# Patient Record
Sex: Female | Born: 1935 | Race: White | Hispanic: No | Smoking: Former smoker
Health system: Southern US, Community
[De-identification: ages and names within clinical notes are randomized; demographics above are authoritative.]

## PROBLEM LIST (undated history)

## (undated) DIAGNOSIS — R82998 Other abnormal findings in urine: Secondary | ICD-10-CM

## (undated) DIAGNOSIS — M5136 Other intervertebral disc degeneration, lumbar region: Secondary | ICD-10-CM

## (undated) DIAGNOSIS — R42 Dizziness and giddiness: Secondary | ICD-10-CM

## (undated) DIAGNOSIS — I10 Essential (primary) hypertension: Secondary | ICD-10-CM

## (undated) DIAGNOSIS — M419 Scoliosis, unspecified: Secondary | ICD-10-CM

## (undated) DIAGNOSIS — W19XXXA Unspecified fall, initial encounter: Secondary | ICD-10-CM

## (undated) DIAGNOSIS — I480 Paroxysmal atrial fibrillation: Secondary | ICD-10-CM

## (undated) DIAGNOSIS — M51369 Other intervertebral disc degeneration, lumbar region without mention of lumbar back pain or lower extremity pain: Secondary | ICD-10-CM

## (undated) DIAGNOSIS — K219 Gastro-esophageal reflux disease without esophagitis: Secondary | ICD-10-CM

## (undated) HISTORY — DX: Other abnormal findings in urine: R82.998

## (undated) HISTORY — DX: Paroxysmal atrial fibrillation: I48.0

## (undated) HISTORY — DX: Other intervertebral disc degeneration, lumbar region without mention of lumbar back pain or lower extremity pain: M51.369

## (undated) HISTORY — DX: Scoliosis, unspecified: M41.9

## (undated) HISTORY — DX: Gastro-esophageal reflux disease without esophagitis: K21.9

## (undated) HISTORY — PX: TUBAL LIGATION: SHX77

## (undated) HISTORY — DX: Other intervertebral disc degeneration, lumbar region: M51.36

---

## 2006-06-21 ENCOUNTER — Ambulatory Visit: Payer: Self-pay

## 2006-06-26 ENCOUNTER — Emergency Department: Payer: Self-pay | Admitting: Emergency Medicine

## 2006-06-26 ENCOUNTER — Other Ambulatory Visit: Payer: Self-pay

## 2010-12-12 ENCOUNTER — Emergency Department: Payer: Self-pay | Admitting: Emergency Medicine

## 2013-10-29 DIAGNOSIS — N182 Chronic kidney disease, stage 2 (mild): Secondary | ICD-10-CM | POA: Insufficient documentation

## 2017-09-03 ENCOUNTER — Encounter: Payer: Self-pay | Admitting: Emergency Medicine

## 2017-09-03 ENCOUNTER — Emergency Department: Payer: Medicare Other

## 2017-09-03 ENCOUNTER — Emergency Department
Admission: EM | Admit: 2017-09-03 | Discharge: 2017-09-03 | Disposition: A | Discharge status: Home / Self Care | Payer: Medicare Other | Attending: Emergency Medicine | Admitting: Emergency Medicine

## 2017-09-03 DIAGNOSIS — I10 Essential (primary) hypertension: Secondary | ICD-10-CM | POA: Insufficient documentation

## 2017-09-03 DIAGNOSIS — H81392 Other peripheral vertigo, left ear: Secondary | ICD-10-CM | POA: Insufficient documentation

## 2017-09-03 DIAGNOSIS — Z87891 Personal history of nicotine dependence: Secondary | ICD-10-CM | POA: Diagnosis not present

## 2017-09-03 DIAGNOSIS — Z79899 Other long term (current) drug therapy: Secondary | ICD-10-CM | POA: Insufficient documentation

## 2017-09-03 DIAGNOSIS — R42 Dizziness and giddiness: Secondary | ICD-10-CM | POA: Diagnosis present

## 2017-09-03 HISTORY — DX: Essential (primary) hypertension: I10

## 2017-09-03 LAB — COMPREHENSIVE METABOLIC PANEL
ALK PHOS: 53 U/L (ref 38–126)
ALT: 11 U/L — ABNORMAL LOW (ref 14–54)
ANION GAP: 11 (ref 5–15)
AST: 20 U/L (ref 15–41)
Albumin: 4 g/dL (ref 3.5–5.0)
BILIRUBIN TOTAL: 0.6 mg/dL (ref 0.3–1.2)
BUN: 14 mg/dL (ref 6–20)
CALCIUM: 9.7 mg/dL (ref 8.9–10.3)
CO2: 27 mmol/L (ref 22–32)
Chloride: 102 mmol/L (ref 101–111)
Creatinine, Ser: 1.18 mg/dL — ABNORMAL HIGH (ref 0.44–1.00)
GFR calc non Af Amer: 43 mL/min — ABNORMAL LOW (ref >60)
GFR, EST AFRICAN AMERICAN: 49 mL/min — AB (ref >60)
GLUCOSE: 117 mg/dL — AB (ref 65–99)
Potassium: 3.8 mmol/L (ref 3.5–5.1)
Sodium: 140 mmol/L (ref 135–145)
TOTAL PROTEIN: 7.5 g/dL (ref 6.5–8.1)

## 2017-09-03 LAB — URINALYSIS, COMPLETE (UACMP) WITH MICROSCOPIC
BILIRUBIN URINE: NEGATIVE
Bacteria, UA: NONE SEEN
GLUCOSE, UA: NEGATIVE mg/dL
Ketones, ur: NEGATIVE mg/dL
Leukocytes, UA: NEGATIVE
NITRITE: NEGATIVE
PH: 7 (ref 5.0–8.0)
Protein, ur: NEGATIVE mg/dL
SPECIFIC GRAVITY, URINE: 1.006 (ref 1.005–1.030)

## 2017-09-03 LAB — CBC
HCT: 41 % (ref 35.0–47.0)
HEMOGLOBIN: 13.5 g/dL (ref 12.0–16.0)
MCH: 29 pg (ref 26.0–34.0)
MCHC: 32.9 g/dL (ref 32.0–36.0)
MCV: 88.2 fL (ref 80.0–100.0)
Platelets: 281 10*3/uL (ref 150–440)
RBC: 4.65 MIL/uL (ref 3.80–5.20)
RDW: 13.6 % (ref 11.5–14.5)
WBC: 7.2 10*3/uL (ref 3.6–11.0)

## 2017-09-03 LAB — TROPONIN I: Troponin I: <0.03 ng/mL (ref <0.03)

## 2017-09-03 MED ORDER — SODIUM CHLORIDE 0.9 % IV BOLUS (SEPSIS)
500.0000 mL | Freq: Once | INTRAVENOUS | Status: AC
  Administered 2017-09-03: 500 mL via INTRAVENOUS

## 2017-09-03 MED ORDER — MECLIZINE HCL 25 MG PO TABS: 25.0000 mg | ORAL_TABLET | Freq: Three times a day (TID) | ORAL | 0 refills | Status: DC | PRN

## 2017-09-03 MED ORDER — MECLIZINE HCL 25 MG PO TABS
25.0000 mg | ORAL_TABLET | Freq: Once | ORAL | Status: AC
  Administered 2017-09-03: 25 mg via ORAL
  Filled 2017-09-03: qty 1

## 2017-09-03 NOTE — Discharge Instructions (Signed)
Please take your vertigo medications as needed for severe symptoms and make an appointment to follow-up with the ear nose and throat doctor this coming week for reevaluation.  Return to the emergency department sooner for any concerns whatsoever.  It was a pleasure to take care of you today, and thank you for coming to our emergency department.  If you have any questions or concerns before leaving please ask the nurse to grab me and I'm more than happy to go through your aftercare instructions again.  If you were prescribed any opioid pain medication today such as Norco, Vicodin, Percocet, morphine, hydrocodone, or oxycodone please make sure you do not drive when you are taking this medication as it can alter your ability to drive safely.  If you have any concerns once you are home that you are not improving or are in fact getting worse before you can make it to your follow-up appointment, please do not hesitate to call 911 and come back for further evaluation.  Merrily BrittleNeil Fares Ramthun, MD  Results for orders placed or performed during the hospital encounter of 09/03/17  CBC  Result Value Ref Range   WBC 7.2 3.6 - 11.0 K/uL   RBC 4.65 3.80 - 5.20 MIL/uL   Hemoglobin 13.5 12.0 - 16.0 g/dL   HCT 16.141.0 09.635.0 - 04.547.0 %   MCV 88.2 80.0 - 100.0 fL   MCH 29.0 26.0 - 34.0 pg   MCHC 32.9 32.0 - 36.0 g/dL   RDW 40.913.6 81.111.5 - 91.414.5 %   Platelets 281 150 - 440 K/uL  Urinalysis, Complete w Microscopic  Result Value Ref Range   Color, Urine STRAW (A) YELLOW   APPearance CLEAR (A) CLEAR   Specific Gravity, Urine 1.006 1.005 - 1.030   pH 7.0 5.0 - 8.0   Glucose, UA NEGATIVE NEGATIVE mg/dL   Hgb urine dipstick MODERATE (A) NEGATIVE   Bilirubin Urine NEGATIVE NEGATIVE   Ketones, ur NEGATIVE NEGATIVE mg/dL   Protein, ur NEGATIVE NEGATIVE mg/dL   Nitrite NEGATIVE NEGATIVE   Leukocytes, UA NEGATIVE NEGATIVE   RBC / HPF 0-5 0 - 5 RBC/hpf   WBC, UA 0-5 0 - 5 WBC/hpf   Bacteria, UA NONE SEEN NONE SEEN   Squamous  Epithelial / LPF 0-5 (A) NONE SEEN  Comprehensive metabolic panel  Result Value Ref Range   Sodium 140 135 - 145 mmol/L   Potassium 3.8 3.5 - 5.1 mmol/L   Chloride 102 101 - 111 mmol/L   CO2 27 22 - 32 mmol/L   Glucose, Bld 117 (H) 65 - 99 mg/dL   BUN 14 6 - 20 mg/dL   Creatinine, Ser 7.821.18 (H) 0.44 - 1.00 mg/dL   Calcium 9.7 8.9 - 95.610.3 mg/dL   Total Protein 7.5 6.5 - 8.1 g/dL   Albumin 4.0 3.5 - 5.0 g/dL   AST 20 15 - 41 U/L   ALT 11 (L) 14 - 54 U/L   Alkaline Phosphatase 53 38 - 126 U/L   Total Bilirubin 0.6 0.3 - 1.2 mg/dL   GFR calc non Af Amer 43 (L) >60 mL/min   GFR calc Af Amer 49 (L) >60 mL/min   Anion gap 11 5 - 15  Troponin I  Result Value Ref Range   Troponin I <0.03 <0.03 ng/mL   Mr Brain Wo Contrast (neuro Protocol)  Result Date: 09/03/2017 CLINICAL DATA:  Vertigo. EXAM: MRI HEAD WITHOUT CONTRAST TECHNIQUE: Multiplanar, multiecho pulse sequences of the brain and surrounding structures were obtained without intravenous  contrast. COMPARISON:  06/26/2006 head CT FINDINGS: Brain: There is no evidence of acute infarct, intracranial hemorrhage, mass, midline shift, or extra-axial fluid collection. There is progressive, advanced cerebral atrophy with notable perisylvian atrophy. Foci of T2 hyperintensity in the cerebral white matter are nonspecific but compatible with mild chronic small vessel ischemic disease. Vascular: Major intracranial vascular flow voids are preserved. Skull and upper cervical spine: No suspicious marrow lesion. Sinuses/Orbits: Unremarkable orbits. Clear paranasal sinuses. Small left mastoid effusion. Other: None. IMPRESSION: 1. No acute intracranial abnormality. 2. Advanced cerebral atrophy. 3. Mild chronic small vessel ischemic disease. 4. Small left mastoid effusion. Electronically Signed   By: Sebastian Ache M.D.   On: 09/03/2017 11:32

## 2017-09-03 NOTE — ED Provider Notes (Signed)
[facility] Emergency Department Provider Note  ____________________________________________   None    (approximate)  I have reviewed the triage vital signs and the nursing notes.   HISTORY  Chief Complaint Dizziness   HPI Kristen Richard is a 81 y.o. female who self presents to the emergency department with ataxia and vertiginous symptoms that began at 330 this morning roughly 5 hours prior to arrival.  She got up to go to the bathroom and felt unsteady on her feet and that she did not know where she was in the room.  She sat down and had persistent symptoms for several hours and then her family showed up and took her to the emergency department.  She says she is currently mostly asymptomatic.  Symptoms seem to be worse when standing up.  They do not seem worse when turning her head right or left.  She has had no ear buzzing.  She does report some numbness and tingling in bilateral hands.  Past Medical History:  Diagnosis Date  . Hypertension     There are no active problems to display for this patient.   History reviewed. No pertinent surgical history.  Prior to Admission medications   Medication Sig Start Date End Date Taking? Authorizing Provider  cetirizine (ZYRTEC) 10 MG tablet Take 10 mg by mouth daily.   Yes [provider]  lisinopril (PRINIVIL,ZESTRIL) 10 MG tablet Take 10 mg by mouth daily.   Yes [provider]  metoprolol tartrate (LOPRESSOR) 25 MG tablet Take 25 mg by mouth 2 (two) times daily.   Yes [provider]  omeprazole (PRILOSEC) 20 MG capsule Take 20 mg by mouth daily.   Yes [provider]  meclizine (ANTIVERT) 25 MG tablet Take 1 tablet (25 mg total) by mouth 3 (three) times daily as needed for dizziness. 09/03/17   Merrily Brittle, MD    Allergies Patient has no known allergies.  No family history on file.  Social History Social History  Substance Use Topics  . Smoking status: Former  Games developer  . Smokeless tobacco: Not on file  . Alcohol use Not on file    Review of Systems Constitutional: No fever/chills Eyes: No visual changes. ENT: No sore throat. Cardiovascular: Denies chest pain. Respiratory: Denies shortness of breath. Gastrointestinal: No abdominal pain.  No nausea, no vomiting.  No diarrhea.  No constipation. Genitourinary: Negative for dysuria. Musculoskeletal: Negative for back pain. Skin: Negative for rash. Neurological: Negative for headaches, focal weakness or numbness.   ____________________________________________   PHYSICAL EXAM:  VITAL SIGNS: ED Triage Vitals [09/03/17 0837]  Enc Vitals Group     BP (!) 148/75     Pulse Rate 74     Resp 20     Temp 98.4 F (36.9 C)     Temp Source Oral     SpO2 96 %     Weight 185 lb (83.9 kg)     Height 5\' 1"  (1.549 m)     Head Circumference      Peak Flow      Pain Score      Pain Loc      Pain Edu?      Excl. in GC?     Constitutional: Alert and oriented x4 pleasant cooperative speaks in full clear sentences no diaphoresis Eyes: PERRL EOMI. no nystagmus Head: Atraumatic. Nose: No congestion/rhinnorhea. Mouth/Throat: No trismus Neck: No stridor.   Cardiovascular: Normal rate, regular rhythm. Grossly normal heart sounds.  Good peripheral circulation.  Respiratory: Normal respiratory effort.  No retractions. Lungs CTAB and moving good air Gastrointestinal: Soft nontender Musculoskeletal: No lower extremity edema   Neurologic:  Alert and oriented 4 Cranial nerves II through XII intact No pronator drift 5 out of 5 grips, biceps, triceps, hip flexion, hip extension plantar flexion, dorsiflexion Sensation intact to light touch throughout 2+ DTRs and no ankle clonus Normal finger-nose-finger Unsteady on her feet with some truncal ataxia Skin:  Skin is warm, dry and intact. No rash noted. Psychiatric: Mood and affect are normal. Speech and behavior are  normal.    ____________________________________________   DIFFERENTIAL includes but not limited to  Peripheral vertigo, central vertigo, stroke ____________________________________________   LABS (all labs ordered are listed, but only abnormal results are displayed)  Labs Reviewed  URINALYSIS, COMPLETE (UACMP) WITH MICROSCOPIC - Abnormal; Notable for the following:       Result Value   Color, Urine STRAW (*)    APPearance CLEAR (*)    Hgb urine dipstick MODERATE (*)    Squamous Epithelial / LPF 0-5 (*)    All other components within normal limits  COMPREHENSIVE METABOLIC PANEL - Abnormal; Notable for the following:    Glucose, Bld 117 (*)    Creatinine, Ser 1.18 (*)    ALT 11 (*)    GFR calc non Af Amer 43 (*)    GFR calc Af Amer 49 (*)    All other components within normal limits  CBC  TROPONIN I    Blood work reviewed and interpreted by me with no evidence of infection no acute disease __________________________________________  EKG  ED ECG REPORT I, Merrily Brittle, the attending physician, personally viewed and interpreted this ECG.  Date: 09/03/2017 EKG Time:  Rate: 73 Rhythm: normal sinus rhythm QRS Axis: normal Intervals: normal ST/T Wave abnormalities: normal Narrative Interpretation: no evidence of acute ischemia  ____________________________________________  RADIOLOGY  MRI reviewed by me shows no acute disease ____________________________________________   PROCEDURES  Procedure(s) performed: no  Procedures  Critical Care performed: no  Observation: no ____________________________________________   INITIAL IMPRESSION / ASSESSMENT AND PLAN / ED COURSE  Pertinent labs & imaging results that were available during my care of the patient were reviewed by me and considered in my medical decision making (see chart for details).  Differential for a 81 year old woman who is lightheaded and vertiginous is extremely broad.  She has no particular  nystagmus on my exam and her symptoms seem to be lingering several hours.  When attempting to ambulate she is grossly unsteady.  I do have concern that this may represent central vertigo instead of a peripheral cause so we will take her to MRI instead of head CT.  She is outside any sort of window for TPA currently.    ----------------------------------------- 12:03 PM on 09/03/2017 -----------------------------------------  Fortunately the patient's MRI is negative for acute pathology.  She still does feel somewhat lightheaded so we will continue additional fluid resuscitation as well as give meclizine and check a urinalysis.  ____________________________________________   ----------------------------------------- 2:10 PM on 09/03/2017 -----------------------------------------  The patient still feels somewhat vertiginous although definitely feels improved and would like to go home.  Her urinalysis is negative.  Point we will treat her symptomatically refer her to otolaryngology outpatient.  Strict return precautions have been given and the patient and family verbalized understanding and agreement with plan.  FINAL CLINICAL IMPRESSION(S) / ED DIAGNOSES  Final diagnoses:  Peripheral vertigo involving left ear      NEW MEDICATIONS STARTED  DURING THIS VISIT:  Discharge Medication List as of 09/03/2017  2:10 PM    START taking these medications   Details  meclizine (ANTIVERT) 25 MG tablet Take 1 tablet (25 mg total) by mouth 3 (three) times daily as needed for dizziness., Starting Sat 09/03/2017, Print         Note:  This document was prepared using Dragon voice recognition software and may include unintentional dictation errors.     Merrily Brittleifenbark, Nachelle Negrette, MD 09/03/17 1534

## 2017-09-03 NOTE — ED Triage Notes (Signed)
States tried to get out bed this am and was too dizzy to get up. Had family assist her but still had dizziness with movement. States no dizziness while still. Denies chest pain.

## 2017-09-03 NOTE — ED Notes (Signed)
ED Provider at bedside. 

## 2017-09-03 NOTE — ED Notes (Signed)
Patient transported to MRI 

## 2018-12-25 ENCOUNTER — Encounter: Payer: Self-pay | Admitting: *Deleted

## 2018-12-28 ENCOUNTER — Ambulatory Visit
Admission: RE | Admit: 2018-12-28 | Discharge: 2018-12-28 | Disposition: A | Discharge status: Home / Self Care | Payer: Medicare Other | Attending: Ophthalmology | Admitting: Ophthalmology

## 2018-12-28 ENCOUNTER — Ambulatory Visit: Payer: Medicare Other | Admitting: Certified Registered"

## 2018-12-28 ENCOUNTER — Encounter
Admission: RE | Disposition: A | Discharge status: Home / Self Care | Payer: Self-pay | Source: Home / Self Care | Attending: Ophthalmology

## 2018-12-28 ENCOUNTER — Encounter: Payer: Self-pay | Admitting: *Deleted

## 2018-12-28 ENCOUNTER — Other Ambulatory Visit: Payer: Self-pay

## 2018-12-28 DIAGNOSIS — E669 Obesity, unspecified: Secondary | ICD-10-CM | POA: Diagnosis not present

## 2018-12-28 DIAGNOSIS — H2512 Age-related nuclear cataract, left eye: Secondary | ICD-10-CM | POA: Diagnosis present

## 2018-12-28 DIAGNOSIS — Z6830 Body mass index (BMI) 30.0-30.9, adult: Secondary | ICD-10-CM | POA: Diagnosis not present

## 2018-12-28 DIAGNOSIS — I10 Essential (primary) hypertension: Secondary | ICD-10-CM | POA: Insufficient documentation

## 2018-12-28 DIAGNOSIS — R42 Dizziness and giddiness: Secondary | ICD-10-CM | POA: Insufficient documentation

## 2018-12-28 DIAGNOSIS — Z87891 Personal history of nicotine dependence: Secondary | ICD-10-CM | POA: Insufficient documentation

## 2018-12-28 HISTORY — DX: Dizziness and giddiness: R42

## 2018-12-28 HISTORY — DX: Unspecified fall, initial encounter: W19.XXXA

## 2018-12-28 HISTORY — PX: CATARACT EXTRACTION W/PHACO: SHX586

## 2018-12-28 SURGERY — PHACOEMULSIFICATION, CATARACT, WITH IOL INSERTION
Anesthesia: Monitor Anesthesia Care | Site: Eye | Laterality: Left

## 2018-12-28 MED ORDER — ACETAZOLAMIDE SODIUM 500 MG IJ SOLR
INTRAMUSCULAR | Status: AC
  Filled 2018-12-28: qty 500

## 2018-12-28 MED ORDER — ONDANSETRON HCL 4 MG/2ML IJ SOLN
INTRAMUSCULAR | Status: DC | PRN
  Administered 2018-12-28: 4 mg via INTRAVENOUS

## 2018-12-28 MED ORDER — CARBACHOL 0.01 % IO SOLN
INTRAOCULAR | Status: DC | PRN
  Administered 2018-12-28: .5 mL via INTRAOCULAR

## 2018-12-28 MED ORDER — MOXIFLOXACIN HCL 0.5 % OP SOLN: 1.0000 [drp] | OPHTHALMIC | Status: DC | PRN

## 2018-12-28 MED ORDER — LIDOCAINE HCL (PF) 4 % IJ SOLN
INTRAMUSCULAR | Status: AC
  Filled 2018-12-28: qty 5

## 2018-12-28 MED ORDER — POVIDONE-IODINE 5 % OP SOLN
OPHTHALMIC | Status: AC
  Filled 2018-12-28: qty 60

## 2018-12-28 MED ORDER — TETRACAINE HCL 0.5 % OP SOLN
1.0000 [drp] | OPHTHALMIC | Status: AC | PRN
  Administered 2018-12-28 (×3): 1 [drp] via OPHTHALMIC

## 2018-12-28 MED ORDER — POVIDONE-IODINE 5 % OP SOLN
OPHTHALMIC | Status: DC | PRN
  Administered 2018-12-28: 1 via OPHTHALMIC

## 2018-12-28 MED ORDER — NA HYALUR & NA CHOND-NA HYALUR 0.55-0.5 ML IO KIT
PACK | INTRAOCULAR | Status: AC
  Filled 2018-12-28: qty 1.05

## 2018-12-28 MED ORDER — EPINEPHRINE PF 1 MG/ML IJ SOLN
INTRAOCULAR | Status: DC | PRN
  Administered 2018-12-28: 1 mL via OPHTHALMIC

## 2018-12-28 MED ORDER — MOXIFLOXACIN HCL 0.5 % OP SOLN
OPHTHALMIC | Status: AC
  Filled 2018-12-28: qty 3

## 2018-12-28 MED ORDER — MIDAZOLAM HCL 2 MG/2ML IJ SOLN
INTRAMUSCULAR | Status: DC | PRN
  Administered 2018-12-28: 0.5 mg via INTRAVENOUS

## 2018-12-28 MED ORDER — ARMC OPHTHALMIC DILATING DROPS
OPHTHALMIC | Status: AC
  Administered 2018-12-28: 1 via OPHTHALMIC
  Filled 2018-12-28: qty 0.5

## 2018-12-28 MED ORDER — ACETAZOLAMIDE SODIUM 500 MG IJ SOLR
INTRAMUSCULAR | Status: DC | PRN
  Administered 2018-12-28: 250 mg via INTRAVENOUS

## 2018-12-28 MED ORDER — DORZOLAMIDE HCL-TIMOLOL MAL 2-0.5 % OP SOLN
OPHTHALMIC | Status: DC | PRN
  Administered 2018-12-28: 2 [drp] via OPHTHALMIC

## 2018-12-28 MED ORDER — ARMC OPHTHALMIC DILATING DROPS
1.0000 "application " | OPHTHALMIC | Status: AC
  Administered 2018-12-28 (×3): 1 via OPHTHALMIC

## 2018-12-28 MED ORDER — TRYPAN BLUE 0.06 % OP SOLN
OPHTHALMIC | Status: DC | PRN
  Administered 2018-12-28: .5 mL via INTRAOCULAR

## 2018-12-28 MED ORDER — SODIUM CHLORIDE 0.9 % IV SOLN
INTRAVENOUS | Status: DC
  Administered 2018-12-28: 10:00:00 via INTRAVENOUS

## 2018-12-28 MED ORDER — NA HYALUR & NA CHOND-NA HYALUR 0.55-0.5 ML IO KIT
PACK | INTRAOCULAR | Status: DC | PRN
  Administered 2018-12-28: 1 via OPHTHALMIC

## 2018-12-28 MED ORDER — NA CHONDROIT SULF-NA HYALURON 40-17 MG/ML IO SOLN
INTRAOCULAR | Status: AC
  Filled 2018-12-28: qty 1

## 2018-12-28 MED ORDER — FENTANYL CITRATE (PF) 100 MCG/2ML IJ SOLN
INTRAMUSCULAR | Status: DC | PRN
  Administered 2018-12-28 (×2): 25 ug via INTRAVENOUS

## 2018-12-28 MED ORDER — EPINEPHRINE PF 1 MG/ML IJ SOLN
INTRAMUSCULAR | Status: AC
  Filled 2018-12-28: qty 1

## 2018-12-28 MED ORDER — FENTANYL CITRATE (PF) 100 MCG/2ML IJ SOLN
INTRAMUSCULAR | Status: AC
  Filled 2018-12-28: qty 2

## 2018-12-28 MED ORDER — LIDOCAINE HCL (PF) 4 % IJ SOLN
INTRAOCULAR | Status: DC | PRN
  Administered 2018-12-28: 2 mL via OPHTHALMIC

## 2018-12-28 MED ORDER — ONDANSETRON HCL 4 MG/2ML IJ SOLN
INTRAMUSCULAR | Status: AC
  Filled 2018-12-28: qty 2

## 2018-12-28 MED ORDER — MIDAZOLAM HCL 2 MG/2ML IJ SOLN
INTRAMUSCULAR | Status: AC
  Filled 2018-12-28: qty 2

## 2018-12-28 MED ORDER — TETRACAINE HCL 0.5 % OP SOLN
OPHTHALMIC | Status: AC
  Administered 2018-12-28: 1 [drp] via OPHTHALMIC
  Filled 2018-12-28: qty 4

## 2018-12-28 MED ORDER — TRYPAN BLUE 0.06 % OP SOLN
OPHTHALMIC | Status: AC
  Filled 2018-12-28: qty 0.5

## 2018-12-28 MED ORDER — MOXIFLOXACIN HCL 0.5 % OP SOLN
OPHTHALMIC | Status: DC | PRN
  Administered 2018-12-28: .2 mL via OPHTHALMIC

## 2018-12-28 MED ORDER — NA CHONDROIT SULF-NA HYALURON 40-17 MG/ML IO SOLN
INTRAOCULAR | Status: DC | PRN
  Administered 2018-12-28: 1 mL via INTRAOCULAR

## 2018-12-28 SURGICAL SUPPLY — 18 items
DISSECTOR HYDRO NUCLEUS 50X22 (MISCELLANEOUS) ×12 IMPLANT
GLOVE BIOGEL M 6.5 STRL (GLOVE) ×3 IMPLANT
GOWN STRL REUS W/ TWL LRG LVL3 (GOWN DISPOSABLE) ×1 IMPLANT
GOWN STRL REUS W/ TWL XL LVL3 (GOWN DISPOSABLE) ×1 IMPLANT
GOWN STRL REUS W/TWL LRG LVL3 (GOWN DISPOSABLE) ×2
GOWN STRL REUS W/TWL XL LVL3 (GOWN DISPOSABLE) ×2
KNIFE 45D UP 2.3 (MISCELLANEOUS) ×3 IMPLANT
LABEL CATARACT MEDS ST (LABEL) ×3 IMPLANT
LENS IOL ACRSF IQ ULTRA 24.5 (Intraocular Lens) ×1 IMPLANT
LENS IOL ACRYSOF IQ 24.5 (Intraocular Lens) ×3 IMPLANT
PACK CATARACT (MISCELLANEOUS) ×3 IMPLANT
PACK CATARACT KING (MISCELLANEOUS) ×3 IMPLANT
PACK EYE AFTER SURG (MISCELLANEOUS) ×3 IMPLANT
SOL BSS BAG (MISCELLANEOUS) ×3
SOLUTION BSS BAG (MISCELLANEOUS) ×1 IMPLANT
SYR 5ML LL (SYRINGE) ×3 IMPLANT
WATER STERILE IRR 250ML POUR (IV SOLUTION) ×3 IMPLANT
WIPE NON LINTING 3.25X3.25 (MISCELLANEOUS) ×3 IMPLANT

## 2018-12-28 NOTE — H&P (Signed)
   I have reviewed the patient's H&P and agree with its findings. There have been no interval changes.  Sherita Decoste MD Ophthalmology 

## 2018-12-28 NOTE — Anesthesia Preprocedure Evaluation (Signed)
Anesthesia Evaluation  Patient identified by MRN, date of birth, ID band Patient awake    Reviewed: Allergy & Precautions, NPO status , Patient's Chart, lab work & pertinent test results  History of Anesthesia Complications Negative for: history of anesthetic complications  Airway Mallampati: III  TM Distance: >3 FB Neck ROM: Full    Dental  (+) Upper Dentures, Lower Dentures   Pulmonary neg sleep apnea, neg COPD, former smoker,    breath sounds clear to auscultation- rhonchi (-) wheezing      Cardiovascular hypertension, Pt. on medications (-) CAD, (-) Past MI, (-) Cardiac Stents and (-) CABG  Rhythm:Regular Rate:Normal - Systolic murmurs and - Diastolic murmurs    Neuro/Psych neg Seizures negative neurological ROS  negative psych ROS   GI/Hepatic negative GI ROS, Neg liver ROS,   Endo/Other  negative endocrine ROSneg diabetes  Renal/GU negative Renal ROS     Musculoskeletal negative musculoskeletal ROS (+)   Abdominal (+) + obese,   Peds  Hematology negative hematology ROS (+)   Anesthesia Other Findings Past Medical History: No date: Fall     Comment:  RISK No date: Hypertension No date: Vertigo   Reproductive/Obstetrics                             Anesthesia Physical Anesthesia Plan  ASA: II  Anesthesia Plan: MAC   Post-op Pain Management:    Induction: Intravenous  PONV Risk Score and Plan: 2 and Midazolam  Airway Management Planned: Natural Airway  Additional Equipment:   Intra-op Plan:   Post-operative Plan:   Informed Consent: I have reviewed the patients History and Physical, chart, labs and discussed the procedure including the risks, benefits and alternatives for the proposed anesthesia with the patient or authorized representative who has indicated his/her understanding and acceptance.       Plan Discussed with: CRNA and Anesthesiologist  Anesthesia  Plan Comments:         Anesthesia Quick Evaluation

## 2018-12-28 NOTE — Anesthesia Postprocedure Evaluation (Signed)
Anesthesia Post Note  Patient: Kristen Richard  Procedure(s) Performed: CATARACT EXTRACTION PHACO AND INTRAOCULAR LENS PLACEMENT ([place]) LEFT (Left Eye)  Patient location during evaluation: Phase II Anesthesia Type: MAC Level of consciousness: awake Pain management: pain level controlled Vital Signs Assessment: post-procedure vital signs reviewed and stable Respiratory status: spontaneous breathing Cardiovascular status: blood pressure returned to baseline Postop Assessment: no apparent nausea or vomiting and adequate PO intake Anesthetic complications: no     Last Vitals:  Vitals:   12/28/18 0932 12/28/18 1147  BP: (!) 166/70 (!) 142/63  Pulse: 74 69  Resp:  18  Temp: 36.8 C (!) 36.1 C  SpO2: 100% 100%    Last Pain:  Vitals:   12/28/18 1147  TempSrc: Temporal  PainSc: 0-No pain                 Lavone Orn

## 2018-12-28 NOTE — OR Nursing (Signed)
Dr. Lara Mulch in to see pt postop 1215 and instructed her and family to go to his office after leaving here for eye pressure check, state they will do as requested.

## 2018-12-28 NOTE — Anesthesia Post-op Follow-up Note (Signed)
Anesthesia QCDR form completed.        

## 2018-12-28 NOTE — Transfer of Care (Signed)
Immediate Anesthesia Transfer of Care Note  Patient: Kristen Richard  Procedure(s) Performed: CATARACT EXTRACTION PHACO AND INTRAOCULAR LENS PLACEMENT ([facility]) LEFT (Left Eye)  Patient Location: PACU  Anesthesia Type:MAC  Level of Consciousness: awake  Airway & Oxygen Therapy: Patient Spontanous Breathing  Post-op Assessment: Report given to RN  Post vital signs: stable  Last Vitals:  Vitals Value Taken Time  BP    Temp    Pulse    Resp    SpO2      Last Pain:  Vitals:   12/28/18 0932  TempSrc: Tympanic  PainSc: 0-No pain         Complications: No apparent anesthesia complications

## 2018-12-28 NOTE — Discharge Instructions (Addendum)
Eye Surgery Discharge Instructions  Expect mild scratchy sensation or mild soreness. DO NOT RUB YOUR EYE!  The day of surgery:  Minimal physical activity, but bed rest is not required  No reading, computer work, or close hand work  No bending, lifting, or straining.  May watch TV  For 24 hours:  No driving, legal decisions, or alcoholic beverages  Safety precautions  Eat anything you prefer: It is better to start with liquids, then soup then solid foods.  Solar shield eyeglasses should be worn for comfort in the sunlight/patch while sleeping  Resume all regular medications including aspirin or Coumadin if these were discontinued prior to surgery. You may shower, bathe, shave, or wash your hair. Tylenol may be taken for mild discomfort. FOLLOW DR. HARROW'S EYE DROP INSTRUCTION SHEET AS REVIEWED.  Call your doctor if you experience significant pain, nausea, or vomiting, fever > 101 or other signs of infection. [phone] or [phone] Specific instructions:  Follow-up Information    Kristen Cousin, MD Follow up.   Specialty:  Ophthalmology Why:  12/29/18 @ 8:15 AM Contact information: [address] [place] [address] [phone]

## 2018-12-28 NOTE — Op Note (Signed)
  PREOPERATIVE DIAGNOSIS:  Nuclear sclerotic cataract of the LEFT eye.   POSTOPERATIVE DIAGNOSIS:  Nuclear sclerotic cataract of the LEFT eye.   OPERATIVE PROCEDURE: Cataract surgery OS   SURGEON:  Elliot Cousin, MD.   ANESTHESIA:  Anesthesiologist: Alver Fisher, MD CRNA: Oletta Lamas, CRNA  1.      Managed anesthesia care. 2.     0.84ml of Shugarcaine was instilled following the paracentesis   COMPLICATIONS:  None.   TECHNIQUE:   Divide and conquer   DESCRIPTION OF PROCEDURE:  The patient was examined and consented in the preoperative holding area where the aforementioned topical anesthesia was applied to the LEFT eye and then brought back to the Operating Room where the left eye was prepped and draped in the usual sterile ophthalmic fashion and a lid speculum was placed. A paracentesis was created with the side port blade, the anterior chamber was washed out with trypan blue to stain the anterior capsule, and the anterior chamber was filled with viscoelastic. A near clear corneal incision was performed with the steel keratome. A continuous curvilinear capsulorrhexis was performed with a cystotome followed by the capsulorrhexis forceps. Hydrodissection and hydrodelineation were carried out with BSS on a blunt cannula. The lens was removed in a divide and conquer  technique and the remaining cortical material was removed with the irrigation-aspiration handpiece. The capsular bag was inflated with viscoelastic and the lens was placed in the capsular bag without complication. The remaining viscoelastic was removed from the eye with the irrigation-aspiration handpiece. The wounds were hydrated. The anterior chamber was flushed and the eye was inflated to physiologic pressure. 0.18ml Vigamox was placed in the anterior chamber. 0.1 ml of Miostat was given, as well as 250 mg Diamox iv (the patient's baseline IOP is around 38). The wounds were found to be water tight. The eye was dressed with  Vigamox. The patient was given protective glasses to wear throughout the day and a shield with which to sleep tonight. The patient was also given drops with which to begin a drop regimen today and will follow-up with me in one day. Implant Name Type Inv. Item Serial No. Manufacturer Lot No. LRB No. Used  LENS IOL ACRYSOF IQ 24.5 - [id number] Intraocular Lens LENS IOL ACRYSOF IQ 24.5 [id number] [employer]  Left 1    Procedure(s) with comments: CATARACT EXTRACTION PHACO AND INTRAOCULAR LENS PLACEMENT ([facility]) LEFT (Left) - [place]   01:35 CDE 15.57 Fluid pack lot # [id number]  Electronically signed: Elliot Cousin 12/28/2018 11:46 AM

## 2018-12-29 ENCOUNTER — Encounter: Payer: Self-pay | Admitting: Ophthalmology

## 2019-10-26 ENCOUNTER — Emergency Department: Payer: Medicare Other

## 2019-10-26 ENCOUNTER — Observation Stay: Payer: Medicare Other

## 2019-10-26 ENCOUNTER — Inpatient Hospital Stay
Admission: EM | Admit: 2019-10-26 | Discharge: 2019-10-30 | DRG: 558 | Disposition: A | Discharge status: Skilled Nursing Facility | Payer: Medicare Other | Attending: Internal Medicine | Admitting: Internal Medicine

## 2019-10-26 ENCOUNTER — Other Ambulatory Visit: Payer: Self-pay

## 2019-10-26 DIAGNOSIS — T796XXA Traumatic ischemia of muscle, initial encounter: Secondary | ICD-10-CM | POA: Diagnosis not present

## 2019-10-26 DIAGNOSIS — Z20828 Contact with and (suspected) exposure to other viral communicable diseases: Secondary | ICD-10-CM | POA: Diagnosis present

## 2019-10-26 DIAGNOSIS — Z961 Presence of intraocular lens: Secondary | ICD-10-CM | POA: Diagnosis present

## 2019-10-26 DIAGNOSIS — Z87891 Personal history of nicotine dependence: Secondary | ICD-10-CM

## 2019-10-26 DIAGNOSIS — I4891 Unspecified atrial fibrillation: Secondary | ICD-10-CM | POA: Diagnosis present

## 2019-10-26 DIAGNOSIS — R778 Other specified abnormalities of plasma proteins: Secondary | ICD-10-CM

## 2019-10-26 DIAGNOSIS — L8990 Pressure ulcer of unspecified site, unspecified stage: Secondary | ICD-10-CM | POA: Insufficient documentation

## 2019-10-26 DIAGNOSIS — H409 Unspecified glaucoma: Secondary | ICD-10-CM | POA: Diagnosis present

## 2019-10-26 DIAGNOSIS — Y92009 Unspecified place in unspecified non-institutional (private) residence as the place of occurrence of the external cause: Secondary | ICD-10-CM

## 2019-10-26 DIAGNOSIS — W19XXXA Unspecified fall, initial encounter: Secondary | ICD-10-CM

## 2019-10-26 DIAGNOSIS — M6282 Rhabdomyolysis: Secondary | ICD-10-CM | POA: Diagnosis not present

## 2019-10-26 DIAGNOSIS — S0990XA Unspecified injury of head, initial encounter: Secondary | ICD-10-CM

## 2019-10-26 DIAGNOSIS — Z888 Allergy status to other drugs, medicaments and biological substances status: Secondary | ICD-10-CM

## 2019-10-26 DIAGNOSIS — E785 Hyperlipidemia, unspecified: Secondary | ICD-10-CM | POA: Diagnosis present

## 2019-10-26 DIAGNOSIS — S0003XA Contusion of scalp, initial encounter: Secondary | ICD-10-CM | POA: Diagnosis present

## 2019-10-26 DIAGNOSIS — D72828 Other elevated white blood cell count: Secondary | ICD-10-CM | POA: Diagnosis present

## 2019-10-26 DIAGNOSIS — L89151 Pressure ulcer of sacral region, stage 1: Secondary | ICD-10-CM | POA: Diagnosis present

## 2019-10-26 DIAGNOSIS — E876 Hypokalemia: Secondary | ICD-10-CM | POA: Diagnosis present

## 2019-10-26 DIAGNOSIS — R42 Dizziness and giddiness: Secondary | ICD-10-CM

## 2019-10-26 DIAGNOSIS — K219 Gastro-esophageal reflux disease without esophagitis: Secondary | ICD-10-CM | POA: Diagnosis present

## 2019-10-26 DIAGNOSIS — Z79899 Other long term (current) drug therapy: Secondary | ICD-10-CM

## 2019-10-26 DIAGNOSIS — W1839XA Other fall on same level, initial encounter: Secondary | ICD-10-CM | POA: Diagnosis present

## 2019-10-26 DIAGNOSIS — Z9842 Cataract extraction status, left eye: Secondary | ICD-10-CM

## 2019-10-26 DIAGNOSIS — R7989 Other specified abnormal findings of blood chemistry: Secondary | ICD-10-CM

## 2019-10-26 DIAGNOSIS — M419 Scoliosis, unspecified: Secondary | ICD-10-CM | POA: Diagnosis present

## 2019-10-26 DIAGNOSIS — E559 Vitamin D deficiency, unspecified: Secondary | ICD-10-CM | POA: Diagnosis present

## 2019-10-26 DIAGNOSIS — Z9851 Tubal ligation status: Secondary | ICD-10-CM

## 2019-10-26 DIAGNOSIS — Z882 Allergy status to sulfonamides status: Secondary | ICD-10-CM

## 2019-10-26 DIAGNOSIS — M479 Spondylosis, unspecified: Secondary | ICD-10-CM | POA: Diagnosis present

## 2019-10-26 DIAGNOSIS — I160 Hypertensive urgency: Secondary | ICD-10-CM | POA: Diagnosis present

## 2019-10-26 DIAGNOSIS — M25551 Pain in right hip: Secondary | ICD-10-CM | POA: Diagnosis present

## 2019-10-26 DIAGNOSIS — R748 Abnormal levels of other serum enzymes: Secondary | ICD-10-CM

## 2019-10-26 DIAGNOSIS — Z9181 History of falling: Secondary | ICD-10-CM

## 2019-10-26 DIAGNOSIS — M5136 Other intervertebral disc degeneration, lumbar region: Secondary | ICD-10-CM | POA: Diagnosis present

## 2019-10-26 DIAGNOSIS — I10 Essential (primary) hypertension: Secondary | ICD-10-CM | POA: Diagnosis present

## 2019-10-26 DIAGNOSIS — E878 Other disorders of electrolyte and fluid balance, not elsewhere classified: Secondary | ICD-10-CM | POA: Diagnosis present

## 2019-10-26 DIAGNOSIS — M5137 Other intervertebral disc degeneration, lumbosacral region: Secondary | ICD-10-CM | POA: Diagnosis present

## 2019-10-26 DIAGNOSIS — M545 Low back pain: Secondary | ICD-10-CM | POA: Diagnosis not present

## 2019-10-26 DIAGNOSIS — D649 Anemia, unspecified: Secondary | ICD-10-CM | POA: Diagnosis present

## 2019-10-26 LAB — URINALYSIS, COMPLETE (UACMP) WITH MICROSCOPIC
Bacteria, UA: NONE SEEN
Bilirubin Urine: NEGATIVE
Glucose, UA: NEGATIVE mg/dL
Ketones, ur: 20 mg/dL — AB
Leukocytes,Ua: NEGATIVE
Nitrite: NEGATIVE
Protein, ur: 30 mg/dL — AB
Specific Gravity, Urine: 1.017 (ref 1.005–1.030)
pH: 5 (ref 5.0–8.0)

## 2019-10-26 LAB — CBC WITH DIFFERENTIAL/PLATELET
Abs Immature Granulocytes: 0.05 10*3/uL (ref 0.00–0.07)
Basophils Absolute: 0 10*3/uL (ref 0.0–0.1)
Basophils Relative: 0 %
Eosinophils Absolute: 0 10*3/uL (ref 0.0–0.5)
Eosinophils Relative: 0 %
HCT: 40.7 % (ref 36.0–46.0)
Hemoglobin: 12.8 g/dL (ref 12.0–15.0)
Immature Granulocytes: 0 %
Lymphocytes Relative: 9 %
Lymphs Abs: 1.1 10*3/uL (ref 0.7–4.0)
MCH: 28.9 pg (ref 26.0–34.0)
MCHC: 31.4 g/dL (ref 30.0–36.0)
MCV: 91.9 fL (ref 80.0–100.0)
Monocytes Absolute: 0.7 10*3/uL (ref 0.1–1.0)
Monocytes Relative: 5 %
Neutro Abs: 10.9 10*3/uL — ABNORMAL HIGH (ref 1.7–7.7)
Neutrophils Relative %: 86 %
Platelets: 362 10*3/uL (ref 150–400)
RBC: 4.43 MIL/uL (ref 3.87–5.11)
RDW: 13.3 % (ref 11.5–15.5)
WBC: 12.8 10*3/uL — ABNORMAL HIGH (ref 4.0–10.5)
nRBC: 0 % (ref 0.0–0.2)

## 2019-10-26 LAB — COMPREHENSIVE METABOLIC PANEL
ALT: 21 U/L (ref 0–44)
AST: 50 U/L — ABNORMAL HIGH (ref 15–41)
Albumin: 3.9 g/dL (ref 3.5–5.0)
Alkaline Phosphatase: 62 U/L (ref 38–126)
Anion gap: 15 (ref 5–15)
BUN: 12 mg/dL (ref 8–23)
CO2: 25 mmol/L (ref 22–32)
Calcium: 9.4 mg/dL (ref 8.9–10.3)
Chloride: 96 mmol/L — ABNORMAL LOW (ref 98–111)
Creatinine, Ser: 0.72 mg/dL (ref 0.44–1.00)
GFR calc Af Amer: >60 mL/min (ref >60)
GFR calc non Af Amer: >60 mL/min (ref >60)
Glucose, Bld: 95 mg/dL (ref 70–99)
Potassium: 3.4 mmol/L — ABNORMAL LOW (ref 3.5–5.1)
Sodium: 136 mmol/L (ref 135–145)
Total Bilirubin: 1.9 mg/dL — ABNORMAL HIGH (ref 0.3–1.2)
Total Protein: 7 g/dL (ref 6.5–8.1)

## 2019-10-26 LAB — TROPONIN I (HIGH SENSITIVITY)
Troponin I (High Sensitivity): 145 ng/L (ref <18)
Troponin I (High Sensitivity): 151 ng/L (ref <18)

## 2019-10-26 LAB — CK: Total CK: 920 U/L — ABNORMAL HIGH (ref 38–234)

## 2019-10-26 LAB — MAGNESIUM: Magnesium: 1.7 mg/dL (ref 1.7–2.4)

## 2019-10-26 MED ORDER — SODIUM CHLORIDE 0.9 % IV SOLN: INTRAVENOUS | Status: DC

## 2019-10-26 MED ORDER — METOPROLOL TARTRATE 25 MG PO TABS
25.0000 mg | ORAL_TABLET | Freq: Two times a day (BID) | ORAL | Status: DC
  Administered 2019-10-26 – 2019-10-30 (×9): 25 mg via ORAL
  Filled 2019-10-26 (×9): qty 1

## 2019-10-26 MED ORDER — ACETAMINOPHEN 325 MG PO TABS: 650.0000 mg | ORAL_TABLET | Freq: Four times a day (QID) | ORAL | Status: DC | PRN

## 2019-10-26 MED ORDER — LISINOPRIL 10 MG PO TABS
20.0000 mg | ORAL_TABLET | Freq: Every day | ORAL | Status: DC
  Administered 2019-10-27 – 2019-10-30 (×4): 20 mg via ORAL
  Filled 2019-10-26 (×4): qty 2

## 2019-10-26 MED ORDER — ENOXAPARIN SODIUM 40 MG/0.4ML ~~LOC~~ SOLN
40.0000 mg | SUBCUTANEOUS | Status: DC
  Administered 2019-10-26 – 2019-10-29 (×4): 40 mg via SUBCUTANEOUS
  Filled 2019-10-26 (×4): qty 0.4

## 2019-10-26 MED ORDER — ACETAMINOPHEN 650 MG RE SUPP: 650.0000 mg | Freq: Four times a day (QID) | RECTAL | Status: DC | PRN

## 2019-10-26 MED ORDER — MAGNESIUM HYDROXIDE 400 MG/5ML PO SUSP
30.0000 mL | Freq: Every day | ORAL | Status: DC | PRN
  Filled 2019-10-26: qty 30

## 2019-10-26 MED ORDER — TRAZODONE HCL 50 MG PO TABS: 25.0000 mg | ORAL_TABLET | Freq: Every evening | ORAL | Status: DC | PRN

## 2019-10-26 MED ORDER — SIMVASTATIN 20 MG PO TABS
40.0000 mg | ORAL_TABLET | Freq: Every day | ORAL | Status: DC
  Administered 2019-10-27 – 2019-10-30 (×4): 40 mg via ORAL
  Filled 2019-10-26 (×5): qty 2

## 2019-10-26 MED ORDER — ONDANSETRON HCL 4 MG/2ML IJ SOLN: 4.0000 mg | Freq: Four times a day (QID) | INTRAMUSCULAR | Status: DC | PRN

## 2019-10-26 MED ORDER — MORPHINE SULFATE (PF) 2 MG/ML IV SOLN
2.0000 mg | Freq: Once | INTRAVENOUS | Status: AC
  Administered 2019-10-26: 2 mg via INTRAVENOUS
  Filled 2019-10-26: qty 1

## 2019-10-26 MED ORDER — MECLIZINE HCL 25 MG PO TABS
12.5000 mg | ORAL_TABLET | Freq: Three times a day (TID) | ORAL | Status: DC | PRN
  Filled 2019-10-26: qty 0.5

## 2019-10-26 MED ORDER — VITAMIN D (ERGOCALCIFEROL) 1.25 MG (50000 UNIT) PO CAPS
50000.0000 [IU] | ORAL_CAPSULE | ORAL | Status: DC
  Administered 2019-10-29: 50000 [IU] via ORAL
  Filled 2019-10-26: qty 1

## 2019-10-26 MED ORDER — PANTOPRAZOLE SODIUM 40 MG PO TBEC
40.0000 mg | DELAYED_RELEASE_TABLET | Freq: Every day | ORAL | Status: DC
  Administered 2019-10-27 – 2019-10-30 (×4): 40 mg via ORAL
  Filled 2019-10-26 (×4): qty 1

## 2019-10-26 MED ORDER — ASPIRIN EC 81 MG PO TBEC
81.0000 mg | DELAYED_RELEASE_TABLET | Freq: Every day | ORAL | Status: DC
  Administered 2019-10-26 – 2019-10-30 (×5): 81 mg via ORAL
  Filled 2019-10-26 (×5): qty 1

## 2019-10-26 MED ORDER — LATANOPROST 0.005 % OP SOLN
1.0000 [drp] | Freq: Every day | OPHTHALMIC | Status: DC
  Administered 2019-10-27 – 2019-10-29 (×3): 1 [drp] via OPHTHALMIC
  Filled 2019-10-26: qty 2.5

## 2019-10-26 MED ORDER — DIPHENHYDRAMINE HCL 25 MG PO TABS: 25.0000 mg | ORAL_TABLET | Freq: Every day | ORAL | Status: DC | PRN

## 2019-10-26 MED ORDER — ONDANSETRON HCL 4 MG PO TABS: 4.0000 mg | ORAL_TABLET | Freq: Four times a day (QID) | ORAL | Status: DC | PRN

## 2019-10-26 MED ORDER — POTASSIUM CHLORIDE 20 MEQ PO PACK
40.0000 meq | PACK | Freq: Once | ORAL | Status: AC
  Administered 2019-10-26: 40 meq via ORAL
  Filled 2019-10-26: qty 2

## 2019-10-26 MED ORDER — SODIUM CHLORIDE 0.9 % IV SOLN: Freq: Once | INTRAVENOUS | Status: AC

## 2019-10-26 MED ORDER — CALCIUM CARBONATE-VITAMIN D 500-200 MG-UNIT PO TABS
1.0000 | ORAL_TABLET | Freq: Every day | ORAL | Status: DC
  Administered 2019-10-27 – 2019-10-30 (×4): 1 via ORAL
  Filled 2019-10-26 (×4): qty 1

## 2019-10-26 MED ORDER — DORZOLAMIDE HCL-TIMOLOL MAL 2-0.5 % OP SOLN
1.0000 [drp] | Freq: Two times a day (BID) | OPHTHALMIC | Status: DC
  Administered 2019-10-27 – 2019-10-30 (×7): 1 [drp] via OPHTHALMIC
  Filled 2019-10-26: qty 10

## 2019-10-26 NOTE — ED Provider Notes (Signed)
[facility] Emergency Department Provider Note       Time seen: ----------------------------------------- 5:58 PM on 10/26/2019 -----------------------------------------   I have reviewed the triage vital signs and the nursing notes.  HISTORY   Chief Complaint Fall    HPI Kristen Richard is a 83 y.o. female with a history of hypertension, vertigo who presents to the ED for a fall.  Patient states it was a couple days ago she fell.  She got dizzy and fell backwards, states she lost consciousness.  She has not had any vomiting, states when she stands she gets dizzy.  EMS reports she may have had a urinary tract infection.  Patient states when she fell she could not get up.  Pain is 8 out of 10 in the low back.  Past Medical History:  Diagnosis Date  . Fall    RISK  . Hypertension   . Vertigo     There are no problems to display for this patient.   Past Surgical History:  Procedure Laterality Date  . CATARACT EXTRACTION W/PHACO Left 12/28/2018   Procedure: CATARACT EXTRACTION PHACO AND INTRAOCULAR LENS PLACEMENT ([place]) LEFT;  Surgeon: Marchia Meiers, MD;  Location: [facility] ORS;  Service: Ophthalmology;  Laterality: Left;  [place]   01:35 CDE 15.57 Fluid pack lot # [id number]  . TUBAL LIGATION      Allergies Sulfa antibiotics and Acetazolamide  Social History Social History   Tobacco Use  . Smoking status: Former Research scientist (life sciences)  . Smokeless tobacco: Former Network engineer Use Topics  . Alcohol use: Not on file  . Drug use: Not on file   Review of Systems Constitutional: Negative for fever. Cardiovascular: Negative for chest pain. Respiratory: Negative for shortness of breath. Gastrointestinal: Negative for abdominal pain, vomiting and diarrhea. Musculoskeletal: Positive for low back pain Skin: Negative for rash. Neurological: Negative for headaches, positive for weakness and dizziness  All systems negative/normal/unremarkable except as stated in the  HPI  ____________________________________________   PHYSICAL EXAM:  VITAL SIGNS: ED Triage Vitals [10/26/19 1757]  Enc Vitals Group     BP      Pulse      Resp      Temp      Temp src      SpO2      Weight 175 lb (79.4 kg)     Height 5\' 3"  (1.6 m)     Head Circumference      Peak Flow      Pain Score 8     Pain Loc      Pain Edu?      Excl. in [place]?    Constitutional: Alert and oriented. Well appearing and in no distress. Eyes: Conjunctivae are normal. Normal extraocular movements. ENT      Head: Normocephalic, posterior scalp hematoma      Nose: No congestion/rhinnorhea.      Mouth/Throat: Mucous membranes are moist.      Neck: No stridor. Cardiovascular: Normal rate, regular rhythm. No murmurs, rubs, or gallops. Respiratory: Normal respiratory effort without tachypnea nor retractions. Breath sounds are clear and equal bilaterally. No wheezes/rales/rhonchi. Gastrointestinal: Soft and nontender. Normal bowel sounds Musculoskeletal: Nontender with normal range of motion in extremities. No lower extremity tenderness nor edema.  Midline lumbar spine tenderness Neurologic:  Normal speech and language. No gross focal neurologic deficits are appreciated.  Skin:  Skin is warm, dry and intact. No rash noted. Psychiatric: Mood and affect are normal. Speech and behavior are normal.  ____________________________________________  ED COURSE:  As part of my medical decision making, I reviewed the following data within the electronic MEDICAL RECORD NUMBER History obtained from family if available, nursing notes, old chart and ekg, as well as notes from prior ED visits. Patient presented for a fall with low back pain and weakness, we will assess with labs and imaging as indicated at this time.   Procedures  [name] was evaluated in Emergency Department on 10/26/2019 for the symptoms described in the history of present illness. She was evaluated in the context of the global COVID-19  pandemic, which necessitated consideration that the patient might be at risk for infection with the SARS-CoV-2 virus that causes COVID-19. Institutional protocols and algorithms that pertain to the evaluation of patients at risk for COVID-19 are in a state of rapid change based on information released by regulatory bodies including the CDC and federal and state organizations. These policies and algorithms were followed during the patient's care in the ED.  ____________________________________________   LABS (pertinent positives/negatives)  Labs Reviewed  CBC WITH DIFFERENTIAL/PLATELET - Abnormal; Notable for the following components:      Result Value   WBC 12.8 (*)    Neutro Abs 10.9 (*)    All other components within normal limits  COMPREHENSIVE METABOLIC PANEL - Abnormal; Notable for the following components:   Potassium 3.4 (*)    Chloride 96 (*)    AST 50 (*)    Total Bilirubin 1.9 (*)    All other components within normal limits  URINALYSIS, COMPLETE (UACMP) WITH MICROSCOPIC - Abnormal; Notable for the following components:   Color, Urine YELLOW (*)    APPearance CLEAR (*)    Hgb urine dipstick MODERATE (*)    Ketones, ur 20 (*)    Protein, ur 30 (*)    All other components within normal limits  CK - Abnormal; Notable for the following components:   Total CK 920 (*)    All other components within normal limits  TROPONIN I (HIGH SENSITIVITY) - Abnormal; Notable for the following components:   Troponin I (High Sensitivity) 145 (*)    All other components within normal limits  TROPONIN I (HIGH SENSITIVITY)   EKG: Interpreted by me, sinus rhythm with sinus arrhythmia, rate is 98 bpm, normal axis, normal QT, nonspecific ST segment changes  RADIOLOGY Images were viewed by me  CT head, lumbar spine x-rays  IMPRESSION:  1. No acute intracranial abnormality. Signs of atrophy as before.  ____________________________________________   DIFFERENTIAL DIAGNOSIS   Vertigo, ataxia,  subdural, dehydration, electrolyte abnormality, occult infection  FINAL ASSESSMENT AND PLAN  Fall, minor head injury, low back pain, elevated CK, elevated troponin   Plan: The patient had presented for a fall. Patient's labs did indicate mild leukocytosis as well as an elevated CK level and elevated troponin likely related to the fall and demand. Patient's imaging regarding her CT head was negative, lumbar spine films were unremarkable.  She was given IV fluids and still seems very weak.  I will discuss with the hospitalist for observation.   Ulice Dash, MD    Note: This note was generated in part or whole with voice recognition software. Voice recognition is usually quite accurate but there are transcription errors that can and very often do occur. I apologize for any typographical errors that were not detected and corrected.     Emily Filbert, MD 10/26/19 [id number]

## 2019-10-26 NOTE — ED Triage Notes (Addendum)
Pt comes via [facility] from home with c/o fall. Pt states it was a couple days ago she fell. Pt states she got dizziness and fell backwards. Pt states she LOC. Pt states she was finally able to get up off the floor.  Pt states she has vertigo. Pt denies any vomiting. Pt states when she stands she gets dizzy.  Pt also c/o lower back pain .EMS reports family thinks it could be a UTI. Pt denies any burning or trouble urinating.  Pt is A*OX4.

## 2019-10-26 NOTE — ED Notes (Signed)
Patient transported to CT 

## 2019-10-26 NOTE — ED Notes (Signed)
No meds verified att unable to pull

## 2019-10-26 NOTE — H&P (Addendum)
[place] [facility]   PATIENT NAME: Kristen Richard Villamor    MR#:  [id number]  DATE OF BIRTH:  08/01/1936  DATE OF ADMISSION:  10/26/2019  PRIMARY CARE PHYSICIAN: [facility]   REQUESTING/REFERRING PHYSICIAN: Daryel NovemberWilliams, Jonathan, MD CHIEF COMPLAINT:   Chief Complaint  Patient presents with  . Fall    HISTORY OF PRESENT ILLNESS:  Kristen Richard Kristen Richard  is a 83 y.o. pleasant Caucasian female with a known history of hypertension and vertigo, who presented to the emergency room with acute onset of recent fall a couple of days ago when she got dizzy with vertigo and felt backwards.  She denied any syncope.  She did have head injuries and complained of headache however never lost consciousness.  Headache stayed with her about 3 to 4 hours.  It took her all day to get up from the ground after she fell.  She admits to hip pain only with ambulation.  She was told before she had arrhythmia and was given metoprolol for.  She does not recall history of atrial fibrillation.  She denies any chest pain or palpitations or dyspnea.  No nausea or vomiting or diarrhea.  She has Antivert 25 mg tablets at home but has not used it.  She continued to have dizziness and vertigo today and therefore presented to the ER.  Upon presentation to the emergency room, blood pressure was 191/103 with a pulse of 103 respirate of 20 and pulse oximetry 100% on room air.  Labs reveal elevated CK of 920 and high sensitive troponin I of 145, leukocytosis of 12.8 with neutrophilia and mild hypokalemia potassium of 3.4 hypochloremia with chloride 96.  BUN was 12 and creatinine 0.72.  Urinalysis showed 21-50 RBCs and 0-5 WBCs with 30 protein 20 ketones.  Noncontrasted head CT scan revealed no acute intracranial normalities.  It revealed signs of atrophy.  Lumbar spine x-ray showed degenerative disc and facet disease diffusely, most pronounced from L3-L4 through L5-S1 with mild rightward scoliosis with no acute bony  abnormality.  EKG showed intermittent atrial fibrillation with a rate of 98.  The patient was given 2 mg of IV morphine sulfate and 1 L bolus of IV normal saline.  I ordered 40 mEq p.o. potassium chloride and added magnesium level.  She will be admitted to an observation medical monitored bed for further evaluation and management.  PAST MEDICAL HISTORY:   Past Medical History:  Diagnosis Date  . Fall    RISK  . Hypertension   . Vertigo   Glaucoma GERD Vitamin D deficiency  PAST SURGICAL HISTORY:   Past Surgical History:  Procedure Laterality Date  . CATARACT EXTRACTION W/PHACO Left 12/28/2018   Procedure: CATARACT EXTRACTION PHACO AND INTRAOCULAR LENS PLACEMENT ([facility]) LEFT;  Surgeon: Elliot CousinHarrow, Brian, MD;  Location: [facility] ORS;  Service: Ophthalmology;  Laterality: Left;  [place]   01:35 CDE 15.57 Fluid pack lot # [id number]  . TUBAL LIGATION      SOCIAL HISTORY:   Social History   Tobacco Use  . Smoking status: Former Games developermoker  . Smokeless tobacco: Former Engineer, waterUser  Substance Use Topics  . Alcohol use: Not on file    FAMILY HISTORY:  No family history on file.  DRUG ALLERGIES:   Allergies  Allergen Reactions  . Sulfa Antibiotics Swelling    Pt was told it caused her brain to swell - unc documented in 2014, pt is unaware of this   . Acetazolamide Nausea And Vomiting    Dizziness  REVIEW OF SYSTEMS:   ROS As per history of present illness. All pertinent systems were reviewed above. Constitutional,  HEENT, cardiovascular, respiratory, GI, GU, musculoskeletal, neuro, psychiatric, endocrine,  integumentary and hematologic systems were reviewed and are otherwise  negative/unremarkable except for positive findings mentioned above in the HPI.   MEDICATIONS AT HOME:   Prior to Admission medications   Medication Sig Start Date End Date Taking? Authorizing Provider  acetaminophen (TYLENOL) 325 MG tablet Take 325-650 mg by mouth every 6 (six) hours as needed for moderate pain or  headache.    Yes [provider]  Calcium Citrate-Vitamin D (CALCIUM + D PO) Take 1 tablet by mouth daily.   Yes [provider]  diphenhydrAMINE (BENADRYL) 25 MG tablet Take 25 mg by mouth daily as needed for allergies.   Yes [provider]  dorzolamide-timolol (COSOPT) 22.3-6.8 MG/ML ophthalmic solution Place 1 drop into both eyes 2 (two) times daily. 09/25/19  Yes [provider]  lisinopril (PRINIVIL,ZESTRIL) 20 MG tablet Take 20 mg by mouth daily.   Yes [provider]  LUMIGAN 0.01 % SOLN Place 1 drop into both eyes at bedtime. 09/12/19  Yes [provider]  metoprolol tartrate (LOPRESSOR) 25 MG tablet Take 25 mg by mouth 2 (two) times daily.   Yes [provider]  omeprazole (PRILOSEC) 20 MG capsule Take 20 mg by mouth daily.   Yes [provider]  Vitamin D, Ergocalciferol, (DRISDOL) 1.25 MG (50000 UT) CAPS capsule Take 50,000 Units by mouth once a week. 09/18/19  Yes [provider]      VITAL SIGNS:  Blood pressure (!) 167/89, pulse 98, temperature (!) 97.5 F (36.4 C), temperature source Oral, resp. rate 15, height 5\' 3"  (1.6 m), weight 79.4 kg, SpO2 100 %.  PHYSICAL EXAMINATION:  Physical Exam  GENERAL:  83 y.o.-year-old pleasant Caucasian female patient lying in the bed with no acute distress.  EYES: Pupils equal, round, reactive to light and accommodation. No scleral icterus. Extraocular muscles intact.  HEENT: Head atraumatic, normocephalic. Oropharynx and nasopharynx clear.  NECK:  Supple, no jugular venous distention. No thyroid enlargement, no tenderness.  LUNGS: Normal breath sounds bilaterally, no wheezing, rales, rhonchi or crepitation. No use of accessory muscles of respiration.  CARDIOVASCULAR: Regular rate and rhythm, S1, S2 normal. No murmurs, rubs, or gallops.  ABDOMEN: Soft, nondistended, nontender. Bowel sounds present. No organomegaly or mass.  EXTREMITIES: No pedal edema, cyanosis,  or clubbing. Musculoskeletal: He had right medial groin tenderness.  No hip deformity or lateral tenderness. NEUROLOGIC: Cranial nerves II through XII are intact. Muscle strength 5/5 in all extremities. Sensation intact. Gait not checked.  PSYCHIATRIC: The patient is alert and oriented x 3.  Normal affect and good eye contact. SKIN: No obvious rash, lesion, or ulcer.   LABORATORY PANEL:   CBC Recent Labs  Lab 10/26/19 1809  WBC 12.8*  HGB 12.8  HCT 40.7  PLT 362   ------------------------------------------------------------------------------------------------------------------  Chemistries  Recent Labs  Lab 10/26/19 1809  NA 136  K 3.4*  CL 96*  CO2 25  GLUCOSE 95  BUN 12  CREATININE 0.72  CALCIUM 9.4  AST 50*  ALT 21  ALKPHOS 62  BILITOT 1.9*   ------------------------------------------------------------------------------------------------------------------  Cardiac Enzymes No results for input(s): TROPONINI in the last 168 hours. ------------------------------------------------------------------------------------------------------------------  RADIOLOGY:  DG Lumbar Spine 2-3 Views  Result Date: 10/26/2019 CLINICAL DATA:  Low back pain.  Fall. EXAM: LUMBAR SPINE - 2-3 VIEW COMPARISON:  None. FINDINGS: Mild rightward  scoliosis. Diffuse degenerative disc and facet disease, most pronounced in the mid and lower lumbar spine. No fracture or subluxation. SI joints symmetric and unremarkable. IMPRESSION: Degenerative disc and facet disease diffusely, most pronounced from L3-4 through L5-S1. Mild rightward scoliosis. No acute bony abnormality. Electronically Signed   By: Rolm Baptise M.D.   On: 10/26/2019 19:01   CT Head Wo Contrast  Result Date: 10/26/2019 CLINICAL DATA:  Head trauma. EXAM: CT HEAD WITHOUT CONTRAST TECHNIQUE: Contiguous axial images were obtained from the base of the skull through the vertex without intravenous contrast. COMPARISON:  06/26/2006 FINDINGS:  Brain: Signs of generalized atrophy similar to the prior study. No signs of intracranial hemorrhage, mass effect, midline shift or evidence of hydrocephalus. Vascular: No hyperdense vessel or unexpected calcification. Skull: Normal. Negative for fracture or focal lesion. Sinuses/Orbits: No acute finding. Other: None. IMPRESSION: 1. No acute intracranial abnormality.  Signs of atrophy as before. Electronically Signed   By: Zetta Bills M.D.   On: 10/26/2019 18:47      IMPRESSION AND PLAN:   1.  Fall associated with dizziness and vertigo with subsequent acute rhabdomyolysis.  The patient will be admitted to an observation medical monitored bed.  She will be hydrated with IV normal saline.  We will follow her CK and troponin I.  We will continue to monitor her EKG.  Pain management to be provided.  Given tenderness in the right medial hip and her mid low back pain, we obtained pelvic and lumbar spine CT scan that came back negative for fractures or acute abnormalities.. A brain MRI will be obtained in a.m. to assess for VBI and central vertigo.  We will place her on as needed Antivert 12.5 mg p.o. 3 times daily.  2.  Hypertensive urgency.  Lisinopril be continued as well as Lopressor.  We will place her on as needed IV labetalol  3.  Atrial fibrillation possibly of new onset, likely newly diagnosed with control ventricular response.  This could have been contributing to her dizziness.  We will follow serial troponin I as mentioned above.  Will obtain a 2D echo and a cardiology consultation in a.m.  I notified Dr. Nehemiah Massed regarding the patient.  The patient's CHA2DS2-VASc score is 4.  At this time she is a fall risk for anticoagulation.  Will obtain a physical therapy consultation for vestibular rehabilitation and and later assess safety for anticoagulation.  4.  GERD.  PPI therapy will be resumed.  5.  Glaucoma.  Ophthalmic gtt.'s will be continued.  6.  Vitamin D deficiency.  The patient's  vitamin  D will be resumed.  7.  DVT prophylaxis.  Subcutaneous Lovenox.   All the records are reviewed and case discussed with ED provider. The plan of care was discussed in details with the patient (and family). I answered all questions. The patient agreed to proceed with the above mentioned plan. Further management will depend upon hospital course.   CODE STATUS: Full code  TOTAL TIME TAKING CARE OF THIS PATIENT: 55 minutes.    Christel Mormon M.D on 10/26/2019 at 8:05 PM  [facility]   From 7 PM-7 AM, contact night-coverage [email or web address]  CC: Primary care physician; [facility]   Note: This dictation was prepared with Dragon dictation along with smaller phrase technology. Any transcriptional errors that result from this process are unintentional.

## 2019-10-26 NOTE — ED Notes (Signed)
Kristen Richard, EDT here for transport

## 2019-10-26 NOTE — ED Notes (Signed)
Pt placed in clean brief,

## 2019-10-26 NOTE — ED Notes (Signed)
Pt saturated in urine. Pt cleaned up and repositioned in bed at this time. External cath placed. Pt given warm blankets and resting comfortably at this time.

## 2019-10-26 NOTE — ED Notes (Signed)
Date and time results received: 10/26/19   Test: Troponin Critical Value: 145  Name of Provider Notified: Jimmye Norman MD

## 2019-10-27 ENCOUNTER — Observation Stay: Payer: Medicare Other

## 2019-10-27 ENCOUNTER — Observation Stay
Admit: 2019-10-27 | Discharge: 2019-10-27 | Disposition: A | Discharge status: Home / Self Care | Payer: Medicare Other | Attending: Family Medicine | Admitting: Family Medicine

## 2019-10-27 ENCOUNTER — Encounter: Payer: Self-pay | Admitting: Family Medicine

## 2019-10-27 DIAGNOSIS — Z20828 Contact with and (suspected) exposure to other viral communicable diseases: Secondary | ICD-10-CM | POA: Diagnosis present

## 2019-10-27 DIAGNOSIS — I10 Essential (primary) hypertension: Secondary | ICD-10-CM | POA: Diagnosis present

## 2019-10-27 DIAGNOSIS — M5137 Other intervertebral disc degeneration, lumbosacral region: Secondary | ICD-10-CM | POA: Diagnosis present

## 2019-10-27 DIAGNOSIS — E559 Vitamin D deficiency, unspecified: Secondary | ICD-10-CM | POA: Diagnosis present

## 2019-10-27 DIAGNOSIS — L89151 Pressure ulcer of sacral region, stage 1: Secondary | ICD-10-CM | POA: Diagnosis present

## 2019-10-27 DIAGNOSIS — W1839XA Other fall on same level, initial encounter: Secondary | ICD-10-CM | POA: Diagnosis present

## 2019-10-27 DIAGNOSIS — L8990 Pressure ulcer of unspecified site, unspecified stage: Secondary | ICD-10-CM | POA: Insufficient documentation

## 2019-10-27 DIAGNOSIS — E785 Hyperlipidemia, unspecified: Secondary | ICD-10-CM | POA: Diagnosis present

## 2019-10-27 DIAGNOSIS — R42 Dizziness and giddiness: Secondary | ICD-10-CM | POA: Diagnosis not present

## 2019-10-27 DIAGNOSIS — E878 Other disorders of electrolyte and fluid balance, not elsewhere classified: Secondary | ICD-10-CM | POA: Diagnosis present

## 2019-10-27 DIAGNOSIS — M5136 Other intervertebral disc degeneration, lumbar region: Secondary | ICD-10-CM | POA: Diagnosis present

## 2019-10-27 DIAGNOSIS — H409 Unspecified glaucoma: Secondary | ICD-10-CM | POA: Diagnosis present

## 2019-10-27 DIAGNOSIS — L8996 Pressure-induced deep tissue damage of unspecified site: Secondary | ICD-10-CM

## 2019-10-27 DIAGNOSIS — M6282 Rhabdomyolysis: Secondary | ICD-10-CM | POA: Diagnosis present

## 2019-10-27 DIAGNOSIS — M25551 Pain in right hip: Secondary | ICD-10-CM | POA: Diagnosis present

## 2019-10-27 DIAGNOSIS — Y92009 Unspecified place in unspecified non-institutional (private) residence as the place of occurrence of the external cause: Secondary | ICD-10-CM | POA: Diagnosis not present

## 2019-10-27 DIAGNOSIS — Z9842 Cataract extraction status, left eye: Secondary | ICD-10-CM | POA: Diagnosis not present

## 2019-10-27 DIAGNOSIS — D649 Anemia, unspecified: Secondary | ICD-10-CM | POA: Diagnosis present

## 2019-10-27 DIAGNOSIS — T796XXA Traumatic ischemia of muscle, initial encounter: Secondary | ICD-10-CM | POA: Diagnosis not present

## 2019-10-27 DIAGNOSIS — K219 Gastro-esophageal reflux disease without esophagitis: Secondary | ICD-10-CM | POA: Diagnosis present

## 2019-10-27 DIAGNOSIS — Z9851 Tubal ligation status: Secondary | ICD-10-CM | POA: Diagnosis not present

## 2019-10-27 DIAGNOSIS — E876 Hypokalemia: Secondary | ICD-10-CM | POA: Diagnosis present

## 2019-10-27 DIAGNOSIS — I160 Hypertensive urgency: Secondary | ICD-10-CM | POA: Diagnosis present

## 2019-10-27 DIAGNOSIS — Z961 Presence of intraocular lens: Secondary | ICD-10-CM | POA: Diagnosis present

## 2019-10-27 DIAGNOSIS — D72828 Other elevated white blood cell count: Secondary | ICD-10-CM | POA: Diagnosis present

## 2019-10-27 DIAGNOSIS — I4891 Unspecified atrial fibrillation: Secondary | ICD-10-CM | POA: Diagnosis present

## 2019-10-27 DIAGNOSIS — M545 Low back pain: Secondary | ICD-10-CM | POA: Diagnosis present

## 2019-10-27 DIAGNOSIS — Z9181 History of falling: Secondary | ICD-10-CM | POA: Diagnosis not present

## 2019-10-27 DIAGNOSIS — M479 Spondylosis, unspecified: Secondary | ICD-10-CM | POA: Diagnosis present

## 2019-10-27 DIAGNOSIS — M419 Scoliosis, unspecified: Secondary | ICD-10-CM | POA: Diagnosis present

## 2019-10-27 DIAGNOSIS — S0003XA Contusion of scalp, initial encounter: Secondary | ICD-10-CM | POA: Diagnosis present

## 2019-10-27 LAB — CBC
HCT: 32.8 % — ABNORMAL LOW (ref 36.0–46.0)
Hemoglobin: 10.7 g/dL — ABNORMAL LOW (ref 12.0–15.0)
MCH: 29.3 pg (ref 26.0–34.0)
MCHC: 32.6 g/dL (ref 30.0–36.0)
MCV: 89.9 fL (ref 80.0–100.0)
Platelets: 278 10*3/uL (ref 150–400)
RBC: 3.65 MIL/uL — ABNORMAL LOW (ref 3.87–5.11)
RDW: 13.5 % (ref 11.5–15.5)
WBC: 10.5 10*3/uL (ref 4.0–10.5)
nRBC: 0 % (ref 0.0–0.2)

## 2019-10-27 LAB — LIPID PANEL
Cholesterol: 149 mg/dL (ref 0–200)
HDL: 64 mg/dL (ref >40)
LDL Cholesterol: 72 mg/dL (ref 0–99)
Total CHOL/HDL Ratio: 2.3 RATIO
Triglycerides: 65 mg/dL (ref <150)
VLDL: 13 mg/dL (ref 0–40)

## 2019-10-27 LAB — TROPONIN I (HIGH SENSITIVITY)
Troponin I (High Sensitivity): 122 ng/L (ref <18)
Troponin I (High Sensitivity): 165 ng/L (ref <18)

## 2019-10-27 LAB — BASIC METABOLIC PANEL
Anion gap: 10 (ref 5–15)
BUN: 13 mg/dL (ref 8–23)
CO2: 27 mmol/L (ref 22–32)
Calcium: 8.6 mg/dL — ABNORMAL LOW (ref 8.9–10.3)
Chloride: 101 mmol/L (ref 98–111)
Creatinine, Ser: 0.7 mg/dL (ref 0.44–1.00)
GFR calc Af Amer: >60 mL/min (ref >60)
GFR calc non Af Amer: >60 mL/min (ref >60)
Glucose, Bld: 68 mg/dL — ABNORMAL LOW (ref 70–99)
Potassium: 4.2 mmol/L (ref 3.5–5.1)
Sodium: 138 mmol/L (ref 135–145)

## 2019-10-27 LAB — SARS CORONAVIRUS 2 (TAT 6-24 HRS): SARS Coronavirus 2: NEGATIVE

## 2019-10-27 LAB — CKMB (ARMC ONLY): CK, MB: 18.9 ng/mL — ABNORMAL HIGH (ref 0.5–5.0)

## 2019-10-27 MED ORDER — PHENOL 1.4 % MT LIQD
1.0000 | OROMUCOSAL | Status: DC | PRN
  Filled 2019-10-27: qty 177

## 2019-10-27 MED ORDER — MENTHOL 3 MG MT LOZG
1.0000 | LOZENGE | OROMUCOSAL | Status: DC | PRN
  Filled 2019-10-27: qty 9

## 2019-10-27 NOTE — Progress Notes (Signed)
PROGRESS NOTE    Kristen Richard  ZOX:[id number] DOB: 1936-09-19 DOA: 10/26/2019 PCP: [facility]   Brief Narrative:  Kristen Richard  is a 83 y.o. pleasant Caucasian female with a known history of hypertension and vertigo, who presented to the emergency room with acute onset of recent fall a couple of days ago when she got dizzy with vertigo and felt backwards.  She denied any syncope.  She did have head injuries and complained of headache however never lost consciousness.  Headache stayed with her about 3 to 4 hours.  It took her all day to get up from the ground after she fell.  She admits to hip pain only with ambulation.  She was told before she had arrhythmia and was given metoprolol for.  She does not recall history of atrial fibrillation.  She denies any chest pain or palpitations or dyspnea.  No nausea or vomiting or diarrhea.  She has Antivert 25 mg tablets at home but has not used it.  She continued to have dizziness and vertigo today and therefore presented to the ER. Upon presentation to the emergency room, blood pressure was 191/103 with a pulse of 103 respirate of 20 and pulse oximetry 100% on room air.  Labs reveal elevated CK of 920 and high sensitive troponin I of 145, leukocytosis of 12.8 with neutrophilia and mild hypokalemia potassium of 3.4 hypochloremia with chloride 96.  BUN was 12 and creatinine 0.72.  Urinalysis showed 21-50 RBCs and 0-5 WBCs with 30 protein 20 ketones.  Noncontrasted head CT scan revealed no acute intracranial normalities.  It revealed signs of atrophy.  Lumbar spine x-ray showed degenerative disc and facet disease diffusely, most pronounced from L3-L4 through L5-S1 with mild rightward scoliosis with no acute bony abnormality.  EKG showed intermittent atrial fibrillation with a rate of 98.  12/19: No acute status changes.  Pending physical therapy evaluation.  Patient resting comfortably in bed.    Assessment & Plan:   Active Problems:  Rhabdomyolysis   Pressure injury of skin  1.  Fall associated with dizziness and vertigo with subsequent acute rhabdomyolysis.   The patient will be admitted to an observation medical monitored bed.   She will be hydrated with IV normal saline.   We will follow her CK and troponin I.   We will continue to monitor her EKG.   Pain management to be provided.   pelvic and lumbar spine CT scan : negative for fractures or acute abnormalities brain MRI: negative for VBI.  Possible NPH noted however suspected volume loss We will place her on as needed Antivert 12.5 mg p.o. 3 times daily. PT and OT consults [facility] consult, home with home health vs SNF  2.  Hypertensive urgency.   Home lisinopril 20 daily Home metoprolol 25 BID PRN IV labetalol  3.  Atrial fibrillation possibly of new onset, likely newly diagnosed with control ventricular response.   This could have been contributing to her dizziness.   We will follow serial troponin I as mentioned above.   Will obtain a 2D echo and a cardiology consultation in a.m.  Dr. Gwen Pounds notified regarding the patient.   The patient's CHA2DS2-VASc score is 4.   At this time she is a fall risk for anticoagulation.   Will obtain a physical therapy consultation for vestibular rehabilitation and and later assess safety for anticoagulation.  4.  GERD.   PPI therapy will be resumed.  5.  Glaucoma.   Ophthalmic gtt.'s will be  continued.  6.  Vitamin D deficiency.   The patient's  vitamin D will be resumed   DVT prophylaxis: Lovenox Code Status: Full Family Communication: Call placed to daughter [name], [phone] no answer, left VM Disposition Plan: Home with home health versus skilled nursing facility.   Consultants:   Cardiology-[facility]  Procedures: None Antimicrobials: None  Subjective: Patient seen and examined No acute events overnight. No new complaints  Objective: Vitals:   10/26/19 2142 10/26/19 2226 10/26/19 2247  10/27/19 0931  BP: (!) 157/73 (!) 185/69 (!) 178/80 (!) 156/70  Pulse: 90 (!) 105 (!) 105 95  Resp: 14 18 16    Temp:  98 F (36.7 C)    TempSrc:      SpO2: 100% 100%  100%  Weight:  87.1 kg    Height:  5\' 3"  (1.6 m)      Intake/Output Summary (Last 24 hours) at 10/27/2019 1300 Last data filed at 10/27/2019 0900 Gross per 24 hour  Intake 1120 ml  Output 100 ml  Net 1020 ml   Filed Weights   10/26/19 1757 10/26/19 2226  Weight: 79.4 kg 87.1 kg    Examination:  General exam: Appears calm and comfortable  Respiratory system: Clear to auscultation. Respiratory effort normal. Cardiovascular system: S1 & S2 heard, RRR. No JVD, murmurs, rubs, gallops or clicks. No pedal edema. Gastrointestinal system: Abdomen is nondistended, soft and nontender. No organomegaly or masses felt. Normal bowel sounds heard. Central nervous system: Alert and oriented. No focal neurological deficits. Extremities: Symmetric 5 x 5 power. Skin: No rashes, lesions or ulcers Psychiatry: Judgement and insight appear normal. Mood & affect appropriate.     Data Reviewed: I have personally reviewed following labs and imaging studies  CBC: Recent Labs  Lab 10/26/19 1809 10/27/19 0440  WBC 12.8* 10.5  NEUTROABS 10.9*  --   HGB 12.8 10.7*  HCT 40.7 32.8*  MCV 91.9 89.9  PLT 362 737   Basic Metabolic Panel: Recent Labs  Lab 10/26/19 1809 10/26/19 1956 10/27/19 0440  NA 136  --  138  K 3.4*  --  4.2  CL 96*  --  101  CO2 25  --  27  GLUCOSE 95  --  68*  BUN 12  --  13  CREATININE 0.72  --  0.70  CALCIUM 9.4  --  8.6*  MG  --  1.7  --    GFR: Estimated Creatinine Clearance: 57.7 mL/min (by C-G formula based on SCr of 0.7 mg/dL). Liver Function Tests: Recent Labs  Lab 10/26/19 1809  AST 50*  ALT 21  ALKPHOS 62  BILITOT 1.9*  PROT 7.0  ALBUMIN 3.9   No results for input(s): LIPASE, AMYLASE in the last 168 hours. No results for input(s): AMMONIA in the last 168 hours. Coagulation  Profile: No results for input(s): INR, PROTIME in the last 168 hours. Cardiac Enzymes: Recent Labs  Lab 10/26/19 1809 10/27/19 0440  CKTOTAL 920*  --   CKMB  --  18.9*   BNP (last 3 results) No results for input(s): PROBNP in the last 8760 hours. HbA1C: No results for input(s): HGBA1C in the last 72 hours. CBG: No results for input(s): GLUCAP in the last 168 hours. Lipid Profile: Recent Labs    10/27/19 0440  CHOL 149  HDL 64  LDLCALC 72  TRIG 65  CHOLHDL 2.3   Thyroid Function Tests: No results for input(s): TSH, T4TOTAL, FREET4, T3FREE, THYROIDAB in the last 72 hours. Anemia Panel: No results  for input(s): VITAMINB12, FOLATE, FERRITIN, TIBC, IRON, RETICCTPCT in the last 72 hours. Sepsis Labs: No results for input(s): PROCALCITON, LATICACIDVEN in the last 168 hours.  Recent Results (from the past 240 hour(s))  SARS CORONAVIRUS 2 (TAT 6-24 HRS) Nasopharyngeal Nasopharyngeal Swab     Status: None   Collection Time: 10/26/19  7:38 PM   Specimen: Nasopharyngeal Swab  Result Value Ref Range Status   SARS Coronavirus 2 NEGATIVE NEGATIVE Final    Comment: (NOTE) SARS-CoV-2 target nucleic acids are NOT DETECTED. The SARS-CoV-2 RNA is generally detectable in upper and lower respiratory specimens during the acute phase of infection. Negative results do not preclude SARS-CoV-2 infection, do not rule out co-infections with other pathogens, and should not be used as the sole basis for treatment or other patient management decisions. Negative results must be combined with clinical observations, patient history, and epidemiological information. The expected result is Negative. Fact Sheet for Patients: HairSlick.nohttps://www.fda.gov/media/138098/download Fact Sheet for Healthcare Providers: quierodirigir.comhttps://www.fda.gov/media/138095/download This test is not yet approved or cleared by the [place] FDA and  has been authorized for detection and/or diagnosis of SARS-CoV-2 by FDA under an  Emergency Use Authorization (EUA). This EUA will remain  in effect (meaning this test can be used) for the duration of the COVID-19 declaration under Section 56 4(b)(1) of the Act, 21 U.S.C. section 360bbb-3(b)(1), unless the authorization is terminated or revoked sooner. Performed at [facility], [address] [place], [place] [address]          Radiology Studies: DG Lumbar Spine 2-3 Views  Result Date: 10/26/2019 CLINICAL DATA:  Low back pain.  Fall. EXAM: LUMBAR SPINE - 2-3 VIEW COMPARISON:  None. FINDINGS: Mild rightward scoliosis. Diffuse degenerative disc and facet disease, most pronounced in the mid and lower lumbar spine. No fracture or subluxation. SI joints symmetric and unremarkable. IMPRESSION: Degenerative disc and facet disease diffusely, most pronounced from L3-4 through L5-S1. Mild rightward scoliosis. No acute bony abnormality. Electronically Signed   By: Charlett NoseKevin  Dover M.D.   On: 10/26/2019 19:01   CT Head Wo Contrast  Result Date: 10/26/2019 CLINICAL DATA:  Head trauma. EXAM: CT HEAD WITHOUT CONTRAST TECHNIQUE: Contiguous axial images were obtained from the base of the skull through the vertex without intravenous contrast. COMPARISON:  06/26/2006 FINDINGS: Brain: Signs of generalized atrophy similar to the prior study. No signs of intracranial hemorrhage, mass effect, midline shift or evidence of hydrocephalus. Vascular: No hyperdense vessel or unexpected calcification. Skull: Normal. Negative for fracture or focal lesion. Sinuses/Orbits: No acute finding. Other: None. IMPRESSION: 1. No acute intracranial abnormality.  Signs of atrophy as before. Electronically Signed   By: Donzetta KohutGeoffrey  Wile M.D.   On: 10/26/2019 18:47   CT Lumbar Spine Wo Contrast  Result Date: 10/26/2019 CLINICAL DATA:  Low back pain after a recent fall. EXAM: CT LUMBAR SPINE WITHOUT CONTRAST TECHNIQUE: Multidetector CT imaging of the lumbar spine was performed without intravenous contrast  administration. Multiplanar CT image reconstructions were also generated. COMPARISON:  Lumbar spine radiographs 10/26/2019 FINDINGS: Segmentation: 5 lumbar type vertebrae. Alignment: Mild lumbar dextroscoliosis. Minimal retrolisthesis of L2 on L3, L3 on L4, and L4 on L5. Vertebrae: No acute fracture or destructive osseous process. Subcentimeter sclerotic foci in the L1 and L2 vertebral bodies, possibly bone islands. Paraspinal and other soft tissues: Partially visualized right pleural effusion. Aortic atherosclerosis without aneurysm. Disc levels: Disc space narrowing is mild at L1-2, moderate at L4-5, and severe at L3-4 and L5-S1 with vacuum disc at each of  these levels. T12-L1: Mild right facet arthrosis without evidence of stenosis. L1-2: Mild disc bulging without evidence of significant stenosis. L2-3: Circumferential disc bulging and mild-to-moderate facet and ligamentum flavum hypertrophy result in mild spinal stenosis and bilateral lateral recess stenosis without significant neural foraminal stenosis. L3-4: Circumferential disc bulging and moderate to severe facet and ligamentum flavum hypertrophy result in moderate spinal stenosis, bilateral lateral recess stenosis, and mild bilateral neural foraminal stenosis. L4-5: Circumferential disc bulging and moderate to severe facet and ligamentum flavum hypertrophy result in moderate spinal stenosis, bilateral lateral recess stenosis, and mild-to-moderate right greater than left neural foraminal stenosis. L5-S1: Disc bulging, endplate spurring, and moderate facet hypertrophy result in moderate left greater than right neural foraminal stenosis without spinal stenosis. IMPRESSION: 1. No evidence of acute osseous abnormality. 2. Multilevel lumbar disc and facet degeneration with moderate spinal stenosis at L3-4 and L4-5. 3. Partially visualized right pleural effusion. 4. Aortic Atherosclerosis (ICD10-I70.0). Electronically Signed   By: Sebastian Ache M.D.   On:  10/26/2019 21:45   CT PELVIS WO CONTRAST  Result Date: 10/26/2019 CLINICAL DATA:  Pelvic trauma. Possible urinary tract infection. Severe low back pain. EXAM: CT PELVIS WITHOUT CONTRAST TECHNIQUE: Multidetector CT imaging of the pelvis was performed following the standard protocol without intravenous contrast. COMPARISON:  None. FINDINGS: Urinary Tract:  No abnormality visualized. Bowel:  Unremarkable visualized pelvic bowel loops. Vascular/Lymphatic: There are atherosclerotic changes of the visualized abdominal aorta. Reproductive:  No mass or other significant abnormality Other:  None. Musculoskeletal: No suspicious bone lesions identified. There is osteopenia. IMPRESSION: 1. No CT evidence for acute pelvic abnormality. 2. Atherosclerotic changes of the visualized abdominal aorta Aortic Atherosclerosis (ICD10-I70.0). Electronically Signed   By: Katherine Mantle M.D.   On: 10/26/2019 21:47   MR BRAIN WO CONTRAST  Result Date: 10/27/2019 CLINICAL DATA:  Initial evaluation for acute vertigo, dizziness, recent fall. EXAM: MRI HEAD WITHOUT CONTRAST TECHNIQUE: Multiplanar, multiecho pulse sequences of the brain and surrounding structures were obtained without intravenous contrast. COMPARISON:  Prior head CT from 10/26/2019. FINDINGS: Brain: Diffuse prominence of the CSF containing spaces compatible with generalized age-related cerebral atrophy. Mild patchy T2/FLAIR hyperintensity within the periventricular white matter most consistent with chronic small vessel ischemic disease, mild for age. No abnormal foci of restricted diffusion to suggest acute or subacute ischemia. Gray-white matter differentiation maintained. No encephalomalacia to suggest chronic cortical infarction. No foci of susceptibility artifact to suggest acute or chronic intracranial hemorrhage. No mass lesion, midline shift or mass effect. Diffuse ventriculomegaly, most likely related to global parenchymal volume loss, although a degree of  NPH could possibly be contributory. Pituitary gland within normal limits. Midline structures intact. No extra-axial fluid collection. Vascular: Major intracranial vascular flow voids are maintained. Skull and upper cervical spine: Craniocervical junction within normal limits. Bone marrow signal intensity normal. Edema at the left parietal scalp consistent with contusion. Sinuses/Orbits: Patient status post ocular lens replacement on the left. Globes orbital soft tissues otherwise unremarkable. Paranasal sinuses are largely clear. Small left mastoid effusion noted, of doubtful significance. Inner ear structures grossly normal. Other: None. IMPRESSION: 1. No acute intracranial abnormality. 2. Left parietal scalp contusion, likely related to history of recent fall. 3. Age-related cerebral atrophy with mild chronic small vessel ischemic disease. 4. Diffuse ventriculomegaly, most likely related to underlying global parenchymal volume loss. A degree of NPH could be contributory, and could be considered in the correct clinical setting. Electronically Signed   By: Rise Mu M.D.   On: 10/27/2019 02:56  Scheduled Meds: . aspirin EC  81 mg Oral Daily  . calcium-vitamin D  1 tablet Oral Daily  . dorzolamide-timolol  1 drop Both Eyes BID  . enoxaparin (LOVENOX) injection  40 mg Subcutaneous Q24H  . latanoprost  1 drop Both Eyes QHS  . lisinopril  20 mg Oral Daily  . metoprolol tartrate  25 mg Oral BID  . pantoprazole  40 mg Oral Daily  . simvastatin  40 mg Oral q1800  . [START ON 10/29/2019] Vitamin D (Ergocalciferol)  50,000 Units Oral Weekly   Continuous Infusions: . sodium chloride 100 mL/hr at 10/27/19 1234     LOS: 0 days    Time spent: 35 minutes    Tresa Moore, MD [facility] Pager [phone]  If 7PM-7AM, please contact night-coverage [email or web address] Password [email or web address] 10/27/2019, 1:00 PM

## 2019-10-27 NOTE — Progress Notes (Signed)
PT Cancellation Note  Patient Details Name: Kristen Richard MRN: [id number] DOB: 1936-04-25   Cancelled Treatment:    Reason Eval/Treat Not Completed: Patient at procedure or test/unavailable.  Order received and chart reviewed.  Nursing currently in room and pt just getting started with breakfast.  Will check back shortly when pt is available.  Roxanne Gates, PT, DPT  Roxanne Gates 10/27/2019, 9:30 AM

## 2019-10-27 NOTE — Progress Notes (Signed)
Consult was placed to iv team to restart iv;  Pt currently having a procedure done at the bedside; was asked to return later.

## 2019-10-27 NOTE — Progress Notes (Signed)
D: Pt alert and oriented. Some confusion is observed in pt as the day progresses.  A: Scheduled medications administered to pt, per MD orders. Support and encouragement provided. Frequent verbal contact made.   R: No adverse drug reactions noted. Pt complaint with medications and treatment plan. Pt interacts well with others on the unit. Pt is stable as this time, will continue to monitor and provide care for as ordered.

## 2019-10-27 NOTE — Care Management Obs Status (Signed)
[place] NOTIFICATION   Patient Details  Name: Kristen Richard MRN: [id number] Date of Birth: 17-Apr-1936   Medicare Observation Status Notification Given:  Yes Patient is confused per daughter [name] [phone]   Marshell Garfinkel, RN 10/27/2019, 8:48 AM

## 2019-10-27 NOTE — Plan of Care (Signed)

## 2019-10-27 NOTE — Consult Note (Signed)
[facility] Cardiology Consultation Note  Patient ID: Kristen Richard, MRN: [id number], DOB/AGE: 81-Apr-1937 81 y.o. Admit date: 10/26/2019   Date of Consult: 10/27/2019 Primary Physician: [facility], [facility] Primary Cardiologist: None  Chief Complaint:  Chief Complaint  Patient presents with  . Fall   Reason for Consult: Atrial fibrillation  HPI: 83 y.o. female with known hypertension hyperlipidemia having a fall for which she was on the floor for greater than 24hours and could not get up.  At that time she was brought to the hospital and given hydration with EKG showing atrial fibrillation with controlled ventricular rate and nonspecific ST and T wave changes.  Chest x-ray showed normal without evidence of pulmonary edema.  Patient was anemic with a hemoglobin of 10 and troponin was 165 more consistent with current demand ischemia or situation.  Patient now has been hydrated and feeling much better and able to move around but unable to stand.  Echocardiogram performed normal showing normal LV systolic function with minor valvular heart disease.  She has spontaneously converted into normal sinus rhythm consistent with an isolated episode of atrial fibrillation most consistent with her situation of falling.  Currently she is not had any evidence of acute coronary syndrome congestive heart failure or anginal symptoms  Past Medical History:  Diagnosis Date  . Fall    RISK  . Hypertension   . Vertigo       Surgical History:  Past Surgical History:  Procedure Laterality Date  . CATARACT EXTRACTION W/PHACO Left 12/28/2018   Procedure: CATARACT EXTRACTION PHACO AND INTRAOCULAR LENS PLACEMENT (IOC) LEFT;  Surgeon: Elliot Cousin, MD;  Location: [facility] ORS;  Service: Ophthalmology;  Laterality: Left;  [place]   01:35 CDE 15.57 Fluid pack lot # [id number]  . TUBAL LIGATION       Home Meds: Prior to Admission medications   Medication Sig Start Date End Date Taking? Authorizing Provider   acetaminophen (TYLENOL) 325 MG tablet Take 325-650 mg by mouth every 6 (six) hours as needed for moderate pain or headache.    Yes [provider]  Calcium Citrate-Vitamin D (CALCIUM + D PO) Take 1 tablet by mouth daily.   Yes [provider]  diphenhydrAMINE (BENADRYL) 25 MG tablet Take 25 mg by mouth daily as needed for allergies.   Yes [provider]  dorzolamide-timolol (COSOPT) 22.3-6.8 MG/ML ophthalmic solution Place 1 drop into both eyes 2 (two) times daily. 09/25/19  Yes [provider]  lisinopril (PRINIVIL,ZESTRIL) 20 MG tablet Take 20 mg by mouth daily.   Yes [provider]  LUMIGAN 0.01 % SOLN Place 1 drop into both eyes at bedtime. 09/12/19  Yes [provider]  metoprolol tartrate (LOPRESSOR) 25 MG tablet Take 25 mg by mouth 2 (two) times daily.   Yes [provider]  omeprazole (PRILOSEC) 20 MG capsule Take 20 mg by mouth daily.   Yes [provider]  Vitamin D, Ergocalciferol, (DRISDOL) 1.25 MG (50000 UT) CAPS capsule Take 50,000 Units by mouth once a week. 09/18/19  Yes [provider]    Inpatient Medications:  . aspirin EC  81 mg Oral Daily  . calcium-vitamin D  1 tablet Oral Daily  . dorzolamide-timolol  1 drop Both Eyes BID  . enoxaparin (LOVENOX) injection  40 mg Subcutaneous Q24H  . latanoprost  1 drop Both Eyes QHS  . lisinopril  20 mg Oral Daily  . metoprolol tartrate  25 mg Oral BID  . pantoprazole  40 mg Oral  Daily  . simvastatin  40 mg Oral q1800  . [START ON 10/29/2019] Vitamin D (Ergocalciferol)  50,000 Units Oral Weekly   . sodium chloride 100 mL/hr at 10/27/19 1234    Allergies:  Allergies  Allergen Reactions  . Sulfa Antibiotics Swelling    Pt was told it caused her brain to swell - [facility] documented in 2014, pt is unaware of this   . Acetazolamide Nausea And Vomiting    Dizziness     Social History   Socioeconomic History  . Marital status: Widowed    Spouse name:  Not on file  . Number of children: Not on file  . Years of education: Not on file  . Highest education level: Not on file  Occupational History  . Not on file  Tobacco Use  . Smoking status: Former Games developermoker  . Smokeless tobacco: Former Engineer, waterUser  Substance and Sexual Activity  . Alcohol use: Not on file  . Drug use: Not on file  . Sexual activity: Not on file  Other Topics Concern  . Not on file  Social History Narrative  . Not on file   Social Determinants of Health   Financial Resource Strain:   . Difficulty of Paying Living Expenses: Not on file  Food Insecurity:   . Worried About Programme researcher, broadcasting/film/videounning Out of Food in the Last Year: Not on file  . Ran Out of Food in the Last Year: Not on file  Transportation Needs:   . Lack of Transportation (Medical): Not on file  . Lack of Transportation (Non-Medical): Not on file  Physical Activity:   . Days of Exercise per Week: Not on file  . Minutes of Exercise per Session: Not on file  Stress:   . Feeling of Stress : Not on file  Social Connections:   . Frequency of Communication with Friends and Family: Not on file  . Frequency of Social Gatherings with Friends and Family: Not on file  . Attends Religious Services: Not on file  . Active Member of Clubs or Organizations: Not on file  . Attends BankerClub or Organization Meetings: Not on file  . Marital Status: Not on file  Intimate Partner Violence:   . Fear of Current or Ex-Partner: Not on file  . Emotionally Abused: Not on file  . Physically Abused: Not on file  . Sexually Abused: Not on file     History reviewed. No pertinent family history.   Review of Systems Positive for weakness Negative for: General:  chills, fever, night sweats or weight changes.  Cardiovascular: PND orthopnea syncope dizziness  Dermatological skin lesions rashes Respiratory: Cough congestion Urologic: Frequent urination urination at night and hematuria Abdominal: negative for nausea, vomiting, diarrhea, bright red  blood per rectum, melena, or hematemesis Neurologic: negative for visual changes, and/or hearing changes  All other systems reviewed and are otherwise negative except as noted above.  Labs: Recent Labs    10/26/19 1809 10/27/19 0440  CKTOTAL 920*  --   CKMB  --  18.9*   Lab Results  Component Value Date   WBC 10.5 10/27/2019   HGB 10.7 (L) 10/27/2019   HCT 32.8 (L) 10/27/2019   MCV 89.9 10/27/2019   PLT 278 10/27/2019    Recent Labs  Lab 10/26/19 1809 10/27/19 0440  NA 136 138  K 3.4* 4.2  CL 96* 101  CO2 25 27  BUN 12 13  CREATININE 0.72 0.70  CALCIUM 9.4 8.6*  PROT 7.0  --   BILITOT 1.9*  --  ALKPHOS 62  --   ALT 21  --   AST 50*  --   GLUCOSE 95 68*   Lab Results  Component Value Date   CHOL 149 10/27/2019   HDL 64 10/27/2019   LDLCALC 72 10/27/2019   TRIG 65 10/27/2019   No results found for: DDIMER  Radiology/Studies:  DG Lumbar Spine 2-3 Views  Result Date: 10/26/2019 CLINICAL DATA:  Low back pain.  Fall. EXAM: LUMBAR SPINE - 2-3 VIEW COMPARISON:  None. FINDINGS: Mild rightward scoliosis. Diffuse degenerative disc and facet disease, most pronounced in the mid and lower lumbar spine. No fracture or subluxation. SI joints symmetric and unremarkable. IMPRESSION: Degenerative disc and facet disease diffusely, most pronounced from L3-4 through L5-S1. Mild rightward scoliosis. No acute bony abnormality. Electronically Signed   By: Charlett Nose M.D.   On: 10/26/2019 19:01   CT Head Wo Contrast  Result Date: 10/26/2019 CLINICAL DATA:  Head trauma. EXAM: CT HEAD WITHOUT CONTRAST TECHNIQUE: Contiguous axial images were obtained from the base of the skull through the vertex without intravenous contrast. COMPARISON:  06/26/2006 FINDINGS: Brain: Signs of generalized atrophy similar to the prior study. No signs of intracranial hemorrhage, mass effect, midline shift or evidence of hydrocephalus. Vascular: No hyperdense vessel or unexpected calcification. Skull: Normal.  Negative for fracture or focal lesion. Sinuses/Orbits: No acute finding. Other: None. IMPRESSION: 1. No acute intracranial abnormality.  Signs of atrophy as before. Electronically Signed   By: Donzetta Kohut M.D.   On: 10/26/2019 18:47   CT Lumbar Spine Wo Contrast  Result Date: 10/26/2019 CLINICAL DATA:  Low back pain after a recent fall. EXAM: CT LUMBAR SPINE WITHOUT CONTRAST TECHNIQUE: Multidetector CT imaging of the lumbar spine was performed without intravenous contrast administration. Multiplanar CT image reconstructions were also generated. COMPARISON:  Lumbar spine radiographs 10/26/2019 FINDINGS: Segmentation: 5 lumbar type vertebrae. Alignment: Mild lumbar dextroscoliosis. Minimal retrolisthesis of L2 on L3, L3 on L4, and L4 on L5. Vertebrae: No acute fracture or destructive osseous process. Subcentimeter sclerotic foci in the L1 and L2 vertebral bodies, possibly bone islands. Paraspinal and other soft tissues: Partially visualized right pleural effusion. Aortic atherosclerosis without aneurysm. Disc levels: Disc space narrowing is mild at L1-2, moderate at L4-5, and severe at L3-4 and L5-S1 with vacuum disc at each of these levels. T12-L1: Mild right facet arthrosis without evidence of stenosis. L1-2: Mild disc bulging without evidence of significant stenosis. L2-3: Circumferential disc bulging and mild-to-moderate facet and ligamentum flavum hypertrophy result in mild spinal stenosis and bilateral lateral recess stenosis without significant neural foraminal stenosis. L3-4: Circumferential disc bulging and moderate to severe facet and ligamentum flavum hypertrophy result in moderate spinal stenosis, bilateral lateral recess stenosis, and mild bilateral neural foraminal stenosis. L4-5: Circumferential disc bulging and moderate to severe facet and ligamentum flavum hypertrophy result in moderate spinal stenosis, bilateral lateral recess stenosis, and mild-to-moderate right greater than left neural  foraminal stenosis. L5-S1: Disc bulging, endplate spurring, and moderate facet hypertrophy result in moderate left greater than right neural foraminal stenosis without spinal stenosis. IMPRESSION: 1. No evidence of acute osseous abnormality. 2. Multilevel lumbar disc and facet degeneration with moderate spinal stenosis at L3-4 and L4-5. 3. Partially visualized right pleural effusion. 4. Aortic Atherosclerosis (ICD10-I70.0). Electronically Signed   By: Sebastian Ache M.D.   On: 10/26/2019 21:45   CT PELVIS WO CONTRAST  Result Date: 10/26/2019 CLINICAL DATA:  Pelvic trauma. Possible urinary tract infection. Severe low back pain. EXAM: CT PELVIS WITHOUT CONTRAST TECHNIQUE:  Multidetector CT imaging of the pelvis was performed following the standard protocol without intravenous contrast. COMPARISON:  None. FINDINGS: Urinary Tract:  No abnormality visualized. Bowel:  Unremarkable visualized pelvic bowel loops. Vascular/Lymphatic: There are atherosclerotic changes of the visualized abdominal aorta. Reproductive:  No mass or other significant abnormality Other:  None. Musculoskeletal: No suspicious bone lesions identified. There is osteopenia. IMPRESSION: 1. No CT evidence for acute pelvic abnormality. 2. Atherosclerotic changes of the visualized abdominal aorta Aortic Atherosclerosis (ICD10-I70.0). Electronically Signed   By: Katherine Mantle M.D.   On: 10/26/2019 21:47   MR BRAIN WO CONTRAST  Result Date: 10/27/2019 CLINICAL DATA:  Initial evaluation for acute vertigo, dizziness, recent fall. EXAM: MRI HEAD WITHOUT CONTRAST TECHNIQUE: Multiplanar, multiecho pulse sequences of the brain and surrounding structures were obtained without intravenous contrast. COMPARISON:  Prior head CT from 10/26/2019. FINDINGS: Brain: Diffuse prominence of the CSF containing spaces compatible with generalized age-related cerebral atrophy. Mild patchy T2/FLAIR hyperintensity within the periventricular white matter most consistent  with chronic small vessel ischemic disease, mild for age. No abnormal foci of restricted diffusion to suggest acute or subacute ischemia. Gray-white matter differentiation maintained. No encephalomalacia to suggest chronic cortical infarction. No foci of susceptibility artifact to suggest acute or chronic intracranial hemorrhage. No mass lesion, midline shift or mass effect. Diffuse ventriculomegaly, most likely related to global parenchymal volume loss, although a degree of NPH could possibly be contributory. Pituitary gland within normal limits. Midline structures intact. No extra-axial fluid collection. Vascular: Major intracranial vascular flow voids are maintained. Skull and upper cervical spine: Craniocervical junction within normal limits. Bone marrow signal intensity normal. Edema at the left parietal scalp consistent with contusion. Sinuses/Orbits: Patient status post ocular lens replacement on the left. Globes orbital soft tissues otherwise unremarkable. Paranasal sinuses are largely clear. Small left mastoid effusion noted, of doubtful significance. Inner ear structures grossly normal. Other: None. IMPRESSION: 1. No acute intracranial abnormality. 2. Left parietal scalp contusion, likely related to history of recent fall. 3. Age-related cerebral atrophy with mild chronic small vessel ischemic disease. 4. Diffuse ventriculomegaly, most likely related to underlying global parenchymal volume loss. A degree of NPH could be contributory, and could be considered in the correct clinical setting. Electronically Signed   By: Rise Mu M.D.   On: 10/27/2019 02:56    EKG: Normal sinus rhythm otherwise normal EKG  Weights: Filed Weights   10/26/19 1757 10/26/19 2226  Weight: 79.4 kg 87.1 kg     Physical Exam: Blood pressure (!) 156/70, pulse 95, temperature 98 F (36.7 C), resp. rate 16, height 5\' 3"  (1.6 m), weight 87.1 kg, SpO2 100 %. Body mass index is 34.01 kg/m. General: Well  developed, well nourished, in no acute distress. Head eyes ears nose throat: Normocephalic, atraumatic, sclera non-icteric, no xanthomas, nares are without discharge. No apparent thyromegaly and/or mass  Lungs: Normal respiratory effort.  no wheezes, no rales, no rhonchi.  Heart: RRR with normal S1 S2. no murmur gallop, no rub, PMI is normal size and placement, carotid upstroke normal without bruit, jugular venous pressure is normal Abdomen: Soft, non-tender, non-distended with normoactive bowel sounds. No hepatomegaly. No rebound/guarding. No obvious abdominal masses. Abdominal aorta is normal size without bruit Extremities: No edema. no cyanosis, no clubbing, no ulcers  Peripheral : 2+ bilateral upper extremity pulses, 2+ bilateral femoral pulses, 2+ bilateral dorsal pedal pulse Neuro: Alert and oriented. No facial asymmetry. No focal deficit. Moves all extremities spontaneously. Musculoskeletal: Normal muscle tone without kyphosis Psych:  Responds to  questions appropriately with a normal affect.    Assessment: 83 year old female with hypertension hyperlipidemia with a fall creating a situation of atrial fibrillation now spontaneously converted to normal sinus rhythm after improvements of condition consistent with an isolated episode of atrial fibrillation without evidence of congestive heart failure or myocardial infarction  Plan: 1.  Continue supportive care for fall and weakness with rehabilitation and physical therapy 2.  Metoprolol for maintenance of normal sinus rhythm and hypertension controlled 3.  Lisinopril for hypertension control 4.  No need for anticoagulation due to patient likely in atrial fibrillation for a very short period of time and consider that this is isolated episode consistent with fall 5.  Begin ambulation and follow for improvements of symptoms and no restrictions to rehabilitation Signed, Corey Skains M.D. [facility] 10/27/2019, 1:37 PM

## 2019-10-27 NOTE — TOC Initial Note (Signed)
Transition of Care [facility]) - Initial/Assessment Note    Patient Details  Name: Kristen Richard MRN: [id number] Date of Birth: 1936/10/04  Transition of Care [facility]) CM/SW Contact:    Marshell Garfinkel, RN Phone Number: 10/27/2019, 8:50 AM  Clinical Narrative:                 RNCM attempted to speak with patient however I was not successful. I spoke with patient's daughter [name] [phone] that lives in [place]. Patient was newly confused and had fallen. [name] called 911. Patient is from home alone. She has had problems over the last month with her legs being weak per [name] and therefore has not been able to drive. Her deceased partner has passed away and was affiliated with a home health agency and then hospice but [name] cannot remember. Patient currently lives at [place] but [name] hopes that patient will agree to stay with family at discharge. MOON letter explained to [name]. TOC to follow.   Expected Discharge Plan: [place]     Patient Goals and CMS Choice   CMS Medicare.gov Compare Post Acute Care list provided to:: Patient Represenative (must comment)(Daughter [name] [phone]) Choice offered to / list presented to : Adult Children  Expected Discharge Plan and Services Expected Discharge Plan: [place]     Post Acute Care Choice: Home Health, Durable Medical Equipment                                        Prior Living Arrangements/Services   Lives with:: Self                   Activities of Daily Living Home Assistive Devices/Equipment: Cane (specify quad or straight) ADL Screening (condition at time of admission) Patient's cognitive ability adequate to safely complete daily activities?: Yes Is the patient deaf or have difficulty hearing?: No Does the patient have difficulty seeing, even when wearing glasses/contacts?: No Does the patient have difficulty  concentrating, remembering, or making decisions?: No Patient able to express need for assistance with ADLs?: Yes Does the patient have difficulty dressing or bathing?: Yes Independently performs ADLs?: No Communication: Independent Dressing (OT): Needs assistance Is this a change from baseline?: Change from baseline, expected to last >3 days Grooming: Needs assistance Is this a change from baseline?: Change from baseline, expected to last >3 days Feeding: Independent Bathing: Needs assistance Is this a change from baseline?: Change from baseline, expected to last >3 days Toileting: Needs assistance Is this a change from baseline?: Change from baseline, expected to last >3days In/Out Bed: Needs assistance Is this a change from baseline?: Change from baseline, expected to last >3 days Walks in Home: Needs assistance Is this a change from baseline?: Change from baseline, expected to last >3 days Does the patient have difficulty walking or climbing stairs?: Yes Weakness of Legs: Both Weakness of Arms/Hands: Both  Permission Sought/Granted                  Emotional Assessment              Admission diagnosis:  Rhabdomyolysis [M62.82] Elevated CK [R74.8] Elevated troponin I level [R77.8] Minor head injury, initial encounter [S09.90XA] Fall, initial encounter [W19.XXXA] Patient Active Problem List   Diagnosis Date Noted  . Pressure injury of skin 10/27/2019  .  Rhabdomyolysis 10/26/2019   PCP:  [facility], [facility] Pharmacy:   [facility] - [place], [place] - [address] [place] [address] Phone: [phone] Fax: [phone]  [facility] [address], [place] - [address] [place] [address] Phone: [phone] Fax: [phone]     Social Determinants of Health (SDOH) Interventions    Readmission Risk Interventions No flowsheet data found.

## 2019-10-27 NOTE — Progress Notes (Signed)
Patient's IV infiltrated in right AC, arm edematous to shoulder. Removed IV, elevated extremity, and applied ice. Patient states she has no discomfort in that arm. Notified on call provider B. Randol Kern NP. Will continue to monitor.

## 2019-10-27 NOTE — Evaluation (Signed)
Physical Therapy Evaluation Patient Details Name: Kristen Richard MRN: [id number] DOB: 1936-03-10 Today's Date: 10/27/2019   History of Present Illness  Pt is an 83 year old F admitted with rhabdomyolysis after a fall in her home.  PMH includes vertigo, hypertension and falls.  Clinical Impression  Pt is an 83 year old F who lives in a one story home alone.  She is a household ambulator with a rollator at baseline.  Pt alert and oriented to self and situation but daughter stated that pt has been "seeing things" like a doll where the scanner on the computer table should be.  Pt able to follow all directions but very weak and reporting pain in LL's.  She was Max A +2 to get to bedside with PT only able to cup hand under heel to assist with LE movement.  Pt's daughter able to support pt's back when sitting on bed and assist with STS transfer, Mod A +2 to monitor vitals.  Pt very weak, with posterior lean against bed and requiring PT to block pt's feet to avoid anterior slide.  Pt did not present as hypotensive but did report "dizziness" when standing.  She may benefit from vestibular testing once she can tolerate more bed mobility.  PT assisted pt back to bed and with positioning for comfort.  She will continue to benefit from skilled PT with focus on strength, tolerance to activity and prevention of future falls.  Due to decline in mobility from baseline, pt will benefit from SNF placement after DC.    Follow Up Recommendations SNF    Equipment Recommendations  None recommended by PT    Recommendations for Other Services       Precautions / Restrictions Precautions Precautions: Fall Restrictions Weight Bearing Restrictions: No      Mobility  Bed Mobility Overal bed mobility: Needs Assistance Bed Mobility: Sit to Supine;Supine to Sit     Supine to sit: +2 for physical assistance;Max assist Sit to supine: +2 for physical assistance;Max assist   General bed mobility comments: Very slow to  move LE's over EOB with assistance, daughter supporting pt's back.  PT assisted pt in return to bed and daughter assisted with scooting up in bed.  Transfers Overall transfer level: Needs assistance Equipment used: Rolling walker (2 wheeled) Transfers: Sit to/from Stand Sit to Stand: Mod assist;+2 physical assistance;From elevated surface         General transfer comment: Pt stood very slowly and with flexed posture over RW; appeared to move further into a flexed position the longer she stood.  Reported feeling "dizzy" when standing.  PT monitored vitals, requiring blocking of feet to avoid sliding forward as pt leaned posteriorly against the bed.  Ambulation/Gait Ambulation/Gait assistance: (Unable to due to weakness.)              Stairs            Wheelchair Mobility    Modified Rankin (Stroke Patients Only)       Balance Overall balance assessment: Needs assistance Sitting-balance support: Bilateral upper extremity supported;Feet supported Sitting balance-Leahy Scale: Poor   Postural control: Posterior lean Standing balance support: Bilateral upper extremity supported Standing balance-Leahy Scale: Poor                               Pertinent Vitals/Pain Pain Assessment: Faces Faces Pain Scale: Hurts little more Pain Location: LE's with palpation; able to tolerate cupping under  heel to assist with movement. Pain Descriptors / Indicators: Grimacing;Guarding Pain Intervention(s): Limited activity within patient's tolerance    Home Living Family/patient expects to be discharged to:: Private residence Living Arrangements: Children Available Help at Discharge: Family Type of Home: House Home Access: Level entry     Home Layout: One level Home Equipment: Environmental consultantWalker - 2 wheels      Prior Function Level of Independence: Independent with assistive device(s)         Comments: Engineer, technical salesHousehold ambulator, daughter lives 1 hr away.     Hand Dominance         Extremity/Trunk Assessment   Upper Extremity Assessment Upper Extremity Assessment: Generalized weakness    Lower Extremity Assessment Lower Extremity Assessment: Generalized weakness(Unable to assess due to TTP.)    Cervical / Trunk Assessment Cervical / Trunk Assessment: Kyphotic  Communication   Communication: No difficulties  Cognition Arousal/Alertness: Awake/alert Behavior During Therapy: WFL for tasks assessed/performed Overall Cognitive Status: Within Functional Limits for tasks assessed                                 General Comments: Daughter stated that pt appeared to be having hallucinations such as seeing a "doll" where the scanner is sitting on the computer table.  Pt also thought that there was a man and woman in her home when her daughter arrived.      General Comments      Exercises Other Exercises Other Exercises: Education: HEP and LE strengthening including ankle pumps, hip abduction, pillow squeezes, heel slides. x4 min Other Exercises: Time to monitor vitals x5 min   Assessment/Plan    PT Assessment Patient needs continued PT services  PT Problem List Decreased strength;Decreased mobility;Decreased activity tolerance;Decreased balance;Decreased knowledge of use of DME       PT Treatment Interventions Therapeutic activities;DME instruction;Gait training;Patient/family education;Therapeutic exercise;Balance training;Functional mobility training    PT Goals (Current goals can be found in the Care Plan section)  Acute Rehab PT Goals Patient Stated Goal: to be able to walk around in her home again. PT Goal Formulation: With patient Time For Goal Achievement: 11/10/19 Potential to Achieve Goals: Good    Frequency Min 2X/week   Barriers to discharge        Co-evaluation               AM-PAC PT "6 Clicks" Mobility  Outcome Measure Help needed turning from your back to your side while in a flat bed without using  bedrails?: A Lot Help needed moving from lying on your back to sitting on the side of a flat bed without using bedrails?: A Lot Help needed moving to and from a bed to a chair (including a wheelchair)?: A Lot Help needed standing up from a chair using your arms (e.g., wheelchair or bedside chair)?: A Lot Help needed to walk in hospital room?: A Lot Help needed climbing 3-5 steps with a railing? : Total 6 Click Score: 11    End of Session Equipment Utilized During Treatment: Gait belt Activity Tolerance: Patient limited by fatigue Patient left: in bed;with call bell/phone within reach;with bed alarm set;with family/visitor present Nurse Communication: Mobility status PT Visit Diagnosis: Unsteadiness on feet (R26.81);Repeated falls (R29.6);Muscle weakness (generalized) (M62.81)    Time: 1137-1209 PT Time Calculation (min) (ACUTE ONLY): 32 min   Charges:   PT Evaluation $PT Eval Moderate Complexity: 1 Mod PT Treatments $Therapeutic Activity: 8-22 mins  Glenetta Hew, PT, DPT   Glenetta Hew 10/27/2019, 1:43 PM

## 2019-10-28 LAB — TROPONIN I (HIGH SENSITIVITY): Troponin I (High Sensitivity): 45 ng/L — ABNORMAL HIGH (ref <18)

## 2019-10-28 LAB — CKMB (ARMC ONLY): CK, MB: 6.3 ng/mL — ABNORMAL HIGH (ref 0.5–5.0)

## 2019-10-28 LAB — ECHOCARDIOGRAM COMPLETE
Height: 63 in
Weight: 3072.33 oz

## 2019-10-28 MED ORDER — ADULT MULTIVITAMIN W/MINERALS CH
1.0000 | ORAL_TABLET | Freq: Every day | ORAL | Status: DC
  Administered 2019-10-29 – 2019-10-30 (×2): 1 via ORAL
  Filled 2019-10-28 (×2): qty 1

## 2019-10-28 MED ORDER — ENSURE ENLIVE PO LIQD
237.0000 mL | Freq: Two times a day (BID) | ORAL | Status: DC
  Administered 2019-10-29 – 2019-10-30 (×2): 237 mL via ORAL

## 2019-10-28 MED ORDER — MECLIZINE HCL 12.5 MG PO TABS: 12.5000 mg | ORAL_TABLET | Freq: Three times a day (TID) | ORAL | 0 refills | Status: DC | PRN

## 2019-10-28 MED ORDER — SODIUM CHLORIDE 0.9 % IV BOLUS
500.0000 mL | Freq: Once | INTRAVENOUS | Status: AC
  Administered 2019-10-28: 500 mL via INTRAVENOUS

## 2019-10-28 NOTE — Progress Notes (Addendum)
PROGRESS NOTE    Kristen Richard  NLZ:[id number] DOB: 08/03/1936 DOA: 10/26/2019 PCP: [facility]   Brief Narrative:  Kristen Richard  is a 83 y.o. pleasant Caucasian female with a known history of hypertension and vertigo, who presented to the emergency room with acute onset of recent fall a couple of days ago when she got dizzy with vertigo and felt backwards.  She denied any syncope.  She did have head injuries and complained of headache however never lost consciousness.  Headache stayed with her about 3 to 4 hours.  It took her all day to get up from the ground after she fell.  She admits to hip pain only with ambulation.  She was told before she had arrhythmia and was given metoprolol for.  She does not recall history of atrial fibrillation.  She denies any chest pain or palpitations or dyspnea.  No nausea or vomiting or diarrhea.  She has Antivert 25 mg tablets at home but has not used it.  She continued to have dizziness and vertigo today and therefore presented to the ER. Upon presentation to the emergency room, blood pressure was 191/103 with a pulse of 103 respirate of 20 and pulse oximetry 100% on room air.  Labs reveal elevated CK of 920 and high sensitive troponin I of 145, leukocytosis of 12.8 with neutrophilia and mild hypokalemia potassium of 3.4 hypochloremia with chloride 96.  BUN was 12 and creatinine 0.72.  Urinalysis showed 21-50 RBCs and 0-5 WBCs with 30 protein 20 ketones.  Noncontrasted head CT scan revealed no acute intracranial normalities.  It revealed signs of atrophy.  Lumbar spine x-ray showed degenerative disc and facet disease diffusely, most pronounced from L3-L4 through L5-S1 with mild rightward scoliosis with no acute bony abnormality.  EKG showed intermittent atrial fibrillation with a rate of 98.  12/19: No acute status changes.  Pending physical therapy evaluation.  Patient resting comfortably in bed.  12/20: Seen by cardiology. Recommend medical management  of atrial fibrillation. No anticoagulation recommended. Physical therapy evaluated patient. Recommended skilled nursing facility placement. Spoke to the daughter [name] via phone who is in agreement with this plan.    Assessment & Plan:   Active Problems:   Rhabdomyolysis   Pressure injury of skin  1.  Fall associated with dizziness and vertigo with subsequent acute rhabdomyolysis.   The patient will be admitted to an observation medical monitored bed.   She will be hydrated with IV normal saline.   We will follow her CK and troponin I.   We will continue to monitor her EKG.   Pain management to be provided.   pelvic and lumbar spine CT scan : negative for fractures or acute abnormalities brain MRI: negative for VBI.  Possible NPH noted however suspected volume loss Continue Antivert 12.5 mg 3 times daily Physical therapy recommendation skilled nursing facility [facility] team consulted  2.  Hypertensive urgency.   Home lisinopril 20 daily Home metoprolol 25 BID PRN IV labetalol Improved BP control  3.  Atrial fibrillation possibly of new onset, likely newly diagnosed with control ventricular response.   This could have been contributing to her dizziness.   Per cardiology note patient is likely in A. fib for very short period of time No anticoagulation recommended Metoprolol for rate control and sinus rhythm maintenance Ambulation and rehabilitation  4.  GERD.   PPI therapy will be resumed.  5.  Glaucoma.   Ophthalmic gtt.'s will be continued.  6.  Vitamin D  deficiency.   The patient's  vitamin D will be resumed  All acute medical issues requiring inpatient hospitalization resolved at this time. Patient medically stable for discharge to skilled nursing facility. TOC team consulted. Patient is stable for discharge to skilled nursing facility once appropriate facility is found.   DVT prophylaxis: Lovenox Code Status: Full Family Communication: Spoke to daughter [name]  via phone (10/28/2019)  disposition Plan: Skilled nursing facility   Consultants:   Cardiology-K[facility]alski  Procedures: None Antimicrobials: None  Subjective: Patient seen and examined No acute events overnight. No new complaints  Objective: Vitals:   10/27/19 2300 10/28/19 0005 10/28/19 0815 10/28/19 0912  BP: (!) 157/69  (!) 151/57 (!) 162/73  Pulse: 88  78 80  Resp:      Temp:  98.7 F (37.1 C) 97.8 F (36.6 C)   TempSrc:  Oral Oral   SpO2:   100% 96%  Weight:      Height:        Intake/Output Summary (Last 24 hours) at 10/28/2019 1214 Last data filed at 10/28/2019 0906 Gross per 24 hour  Intake 240 ml  Output 350 ml  Net -110 ml   Filed Weights   10/26/19 1757 10/26/19 2226  Weight: 79.4 kg 87.1 kg    Examination:  General exam: Appears calm and comfortable  Respiratory system: Clear to auscultation. Respiratory effort normal. Cardiovascular system: S1 & S2 heard, RRR. No JVD, murmurs, rubs, gallops or clicks. No pedal edema. Gastrointestinal system: Abdomen is nondistended, soft and nontender. No organomegaly or masses felt. Normal bowel sounds heard. Central nervous system: Alert and oriented. No focal neurological deficits. Extremities: Symmetric 5 x 5 power. Skin: No rashes, lesions or ulcers Psychiatry: Judgement and insight appear normal. Mood & affect appropriate.     Data Reviewed: I have personally reviewed following labs and imaging studies  CBC: Recent Labs  Lab 10/26/19 1809 10/27/19 0440  WBC 12.8* 10.5  NEUTROABS 10.9*  --   HGB 12.8 10.7*  HCT 40.7 32.8*  MCV 91.9 89.9  PLT 362 278   Basic Metabolic Panel: Recent Labs  Lab 10/26/19 1809 10/26/19 1956 10/27/19 0440  NA 136  --  138  K 3.4*  --  4.2  CL 96*  --  101  CO2 25  --  27  GLUCOSE 95  --  68*  BUN 12  --  13  CREATININE 0.72  --  0.70  CALCIUM 9.4  --  8.6*  MG  --  1.7  --    GFR: Estimated Creatinine Clearance: 57.7 mL/min (by C-G formula based on SCr of  0.7 mg/dL). Liver Function Tests: Recent Labs  Lab 10/26/19 1809  AST 50*  ALT 21  ALKPHOS 62  BILITOT 1.9*  PROT 7.0  ALBUMIN 3.9   No results for input(s): LIPASE, AMYLASE in the last 168 hours. No results for input(s): AMMONIA in the last 168 hours. Coagulation Profile: No results for input(s): INR, PROTIME in the last 168 hours. Cardiac Enzymes: Recent Labs  Lab 10/26/19 1809 10/27/19 0440 10/28/19 0436  CKTOTAL 920*  --   --   CKMB  --  18.9* 6.3*   BNP (last 3 results) No results for input(s): PROBNP in the last 8760 hours. HbA1C: No results for input(s): HGBA1C in the last 72 hours. CBG: No results for input(s): GLUCAP in the last 168 hours. Lipid Profile: Recent Labs    10/27/19 0440  CHOL 149  HDL 64  LDLCALC 72  TRIG  65  CHOLHDL 2.3   Thyroid Function Tests: No results for input(s): TSH, T4TOTAL, FREET4, T3FREE, THYROIDAB in the last 72 hours. Anemia Panel: No results for input(s): VITAMINB12, FOLATE, FERRITIN, TIBC, IRON, RETICCTPCT in the last 72 hours. Sepsis Labs: No results for input(s): PROCALCITON, LATICACIDVEN in the last 168 hours.  Recent Results (from the past 240 hour(s))  SARS CORONAVIRUS 2 (TAT 6-24 HRS) Nasopharyngeal Nasopharyngeal Swab     Status: None   Collection Time: 10/26/19  7:38 PM   Specimen: Nasopharyngeal Swab  Result Value Ref Range Status   SARS Coronavirus 2 NEGATIVE NEGATIVE Final    Comment: (NOTE) SARS-CoV-2 target nucleic acids are NOT DETECTED. The SARS-CoV-2 RNA is generally detectable in upper and lower respiratory specimens during the acute phase of infection. Negative results do not preclude SARS-CoV-2 infection, do not rule out co-infections with other pathogens, and should not be used as the sole basis for treatment or other patient management decisions. Negative results must be combined with clinical observations, patient history, and epidemiological information. The expected result is Negative. Fact  Sheet for Patients: HairSlick.no Fact Sheet for Healthcare Providers: quierodirigir.com This test is not yet approved or cleared by the [place] FDA and  has been authorized for detection and/or diagnosis of SARS-CoV-2 by FDA under an Emergency Use Authorization (EUA). This EUA will remain  in effect (meaning this test can be used) for the duration of the COVID-19 declaration under Section 56 4(b)(1) of the Act, 21 U.S.C. section 360bbb-3(b)(1), unless the authorization is terminated or revoked sooner. Performed at [facility], [address], [place], [place] [address]          Radiology Studies: DG Lumbar Spine 2-3 Views  Result Date: 10/26/2019 CLINICAL DATA:  Low back pain.  Fall. EXAM: LUMBAR SPINE - 2-3 VIEW COMPARISON:  None. FINDINGS: Mild rightward scoliosis. Diffuse degenerative disc and facet disease, most pronounced in the mid and lower lumbar spine. No fracture or subluxation. SI joints symmetric and unremarkable. IMPRESSION: Degenerative disc and facet disease diffusely, most pronounced from L3-4 through L5-S1. Mild rightward scoliosis. No acute bony abnormality. Electronically Signed   By: Charlett Nose M.D.   On: 10/26/2019 19:01   CT Head Wo Contrast  Result Date: 10/26/2019 CLINICAL DATA:  Head trauma. EXAM: CT HEAD WITHOUT CONTRAST TECHNIQUE: Contiguous axial images were obtained from the base of the skull through the vertex without intravenous contrast. COMPARISON:  06/26/2006 FINDINGS: Brain: Signs of generalized atrophy similar to the prior study. No signs of intracranial hemorrhage, mass effect, midline shift or evidence of hydrocephalus. Vascular: No hyperdense vessel or unexpected calcification. Skull: Normal. Negative for fracture or focal lesion. Sinuses/Orbits: No acute finding. Other: None. IMPRESSION: 1. No acute intracranial abnormality.  Signs of atrophy as before. Electronically Signed   By:  Donzetta Kohut M.D.   On: 10/26/2019 18:47   CT Lumbar Spine Wo Contrast  Result Date: 10/26/2019 CLINICAL DATA:  Low back pain after a recent fall. EXAM: CT LUMBAR SPINE WITHOUT CONTRAST TECHNIQUE: Multidetector CT imaging of the lumbar spine was performed without intravenous contrast administration. Multiplanar CT image reconstructions were also generated. COMPARISON:  Lumbar spine radiographs 10/26/2019 FINDINGS: Segmentation: 5 lumbar type vertebrae. Alignment: Mild lumbar dextroscoliosis. Minimal retrolisthesis of L2 on L3, L3 on L4, and L4 on L5. Vertebrae: No acute fracture or destructive osseous process. Subcentimeter sclerotic foci in the L1 and L2 vertebral bodies, possibly bone islands. Paraspinal and other soft tissues: Partially visualized right pleural effusion. Aortic  atherosclerosis without aneurysm. Disc levels: Disc space narrowing is mild at L1-2, moderate at L4-5, and severe at L3-4 and L5-S1 with vacuum disc at each of these levels. T12-L1: Mild right facet arthrosis without evidence of stenosis. L1-2: Mild disc bulging without evidence of significant stenosis. L2-3: Circumferential disc bulging and mild-to-moderate facet and ligamentum flavum hypertrophy result in mild spinal stenosis and bilateral lateral recess stenosis without significant neural foraminal stenosis. L3-4: Circumferential disc bulging and moderate to severe facet and ligamentum flavum hypertrophy result in moderate spinal stenosis, bilateral lateral recess stenosis, and mild bilateral neural foraminal stenosis. L4-5: Circumferential disc bulging and moderate to severe facet and ligamentum flavum hypertrophy result in moderate spinal stenosis, bilateral lateral recess stenosis, and mild-to-moderate right greater than left neural foraminal stenosis. L5-S1: Disc bulging, endplate spurring, and moderate facet hypertrophy result in moderate left greater than right neural foraminal stenosis without spinal stenosis. IMPRESSION:  1. No evidence of acute osseous abnormality. 2. Multilevel lumbar disc and facet degeneration with moderate spinal stenosis at L3-4 and L4-5. 3. Partially visualized right pleural effusion. 4. Aortic Atherosclerosis (ICD10-I70.0). Electronically Signed   By: Sebastian AcheAllen  Grady M.D.   On: 10/26/2019 21:45   CT PELVIS WO CONTRAST  Result Date: 10/26/2019 CLINICAL DATA:  Pelvic trauma. Possible urinary tract infection. Severe low back pain. EXAM: CT PELVIS WITHOUT CONTRAST TECHNIQUE: Multidetector CT imaging of the pelvis was performed following the standard protocol without intravenous contrast. COMPARISON:  None. FINDINGS: Urinary Tract:  No abnormality visualized. Bowel:  Unremarkable visualized pelvic bowel loops. Vascular/Lymphatic: There are atherosclerotic changes of the visualized abdominal aorta. Reproductive:  No mass or other significant abnormality Other:  None. Musculoskeletal: No suspicious bone lesions identified. There is osteopenia. IMPRESSION: 1. No CT evidence for acute pelvic abnormality. 2. Atherosclerotic changes of the visualized abdominal aorta Aortic Atherosclerosis (ICD10-I70.0). Electronically Signed   By: Katherine Mantlehristopher  Green M.D.   On: 10/26/2019 21:47   MR BRAIN WO CONTRAST  Result Date: 10/27/2019 CLINICAL DATA:  Initial evaluation for acute vertigo, dizziness, recent fall. EXAM: MRI HEAD WITHOUT CONTRAST TECHNIQUE: Multiplanar, multiecho pulse sequences of the brain and surrounding structures were obtained without intravenous contrast. COMPARISON:  Prior head CT from 10/26/2019. FINDINGS: Brain: Diffuse prominence of the CSF containing spaces compatible with generalized age-related cerebral atrophy. Mild patchy T2/FLAIR hyperintensity within the periventricular white matter most consistent with chronic small vessel ischemic disease, mild for age. No abnormal foci of restricted diffusion to suggest acute or subacute ischemia. Gray-white matter differentiation maintained. No  encephalomalacia to suggest chronic cortical infarction. No foci of susceptibility artifact to suggest acute or chronic intracranial hemorrhage. No mass lesion, midline shift or mass effect. Diffuse ventriculomegaly, most likely related to global parenchymal volume loss, although a degree of NPH could possibly be contributory. Pituitary gland within normal limits. Midline structures intact. No extra-axial fluid collection. Vascular: Major intracranial vascular flow voids are maintained. Skull and upper cervical spine: Craniocervical junction within normal limits. Bone marrow signal intensity normal. Edema at the left parietal scalp consistent with contusion. Sinuses/Orbits: Patient status post ocular lens replacement on the left. Globes orbital soft tissues otherwise unremarkable. Paranasal sinuses are largely clear. Small left mastoid effusion noted, of doubtful significance. Inner ear structures grossly normal. Other: None. IMPRESSION: 1. No acute intracranial abnormality. 2. Left parietal scalp contusion, likely related to history of recent fall. 3. Age-related cerebral atrophy with mild chronic small vessel ischemic disease. 4. Diffuse ventriculomegaly, most likely related to underlying global parenchymal volume loss. A degree of NPH could  be contributory, and could be considered in the correct clinical setting. Electronically Signed   By: Jeannine Boga M.D.   On: 10/27/2019 02:56        Scheduled Meds: . aspirin EC  81 mg Oral Daily  . calcium-vitamin D  1 tablet Oral Daily  . dorzolamide-timolol  1 drop Both Eyes BID  . enoxaparin (LOVENOX) injection  40 mg Subcutaneous Q24H  . latanoprost  1 drop Both Eyes QHS  . lisinopril  20 mg Oral Daily  . metoprolol tartrate  25 mg Oral BID  . pantoprazole  40 mg Oral Daily  . simvastatin  40 mg Oral q1800  . [START ON 10/29/2019] Vitamin D (Ergocalciferol)  50,000 Units Oral Weekly   Continuous Infusions:    LOS: 1 day    Time spent: 35  minutes    Sidney Ace, MD [facility] Pager [phone]  If 7PM-7AM, please contact night-coverage [email or web address] Password [email or web address] 10/28/2019, 12:14 PM

## 2019-10-28 NOTE — Progress Notes (Signed)
Initial Nutrition Assessment  DOCUMENTATION CODES:   Obesity unspecified  INTERVENTION:  Recommend liberalizing diet to regular.  Provide Ensure Enlive po BID, each supplement provides 350 kcal and 20 grams of protein. Patient prefers strawberry.  Provide daily MVI.   Encouraged adequate intake at meals.  NUTRITION DIAGNOSIS:   Inadequate oral intake related to decreased appetite as evidenced by per patient/family report.  GOAL:   Patient will meet greater than or equal to 90% of their needs  MONITOR:   PO intake, Supplement acceptance, Labs, Weight trends, I & O's, Skin  REASON FOR ASSESSMENT:   Malnutrition Screening Tool    ASSESSMENT:   83 year old female with PMHx of HTN, vertigo, GERD, glaucoma, vitamin D deficiency admitted after a fall with dizziness and vertigo, acute rhabdomyolysis, HTN urgency, A-fib possibly new onset.   Met with patient at bedside. She reports she has had a decreased appetite and intake for the past week. She reports she typically eats well and has 2-3 meals per day. Over the past week she has been trying to eat 1-2 meals per day but is eating very small amounts. For lunch today she reports she had bites of her meat and vegetables. She reports the food does not taste good which is also limiting her intake. She is amenable to drinking Ensure to help meet calorie/protein needs.  Patient reports her UBW is 190 lbs and that she thinks she is down to around 170 lbs now. According to chart she is 87.1 kg (192.02 lbs) but our weight could be inaccurate. Will continue to monitor.  Medications reviewed and include: Oscal with D 1 tablet daily, lisinopril, pantoprazole, vitamin D 50000 units weekly.  Labs reviewed.  Patient is at risk for malnutrition but does not meet criteria for malnutrition at this time.  NUTRITION - FOCUSED PHYSICAL EXAM:    Most Recent Value  Orbital Region  No depletion  Upper Arm Region  Mild depletion  Thoracic and  Lumbar Region  No depletion  Buccal Region  No depletion  Temple Region  Mild depletion  Clavicle Bone Region  Mild depletion  Clavicle and Acromion Bone Region  Mild depletion  Scapular Bone Region  Mild depletion  Dorsal Hand  Mild depletion  Patellar Region  Unable to assess  Anterior Thigh Region  Unable to assess  Posterior Calf Region  Unable to assess  Edema (RD Assessment)  -- [non-pitting]  Hair  Reviewed  Eyes  Reviewed  Mouth  Reviewed  Skin  Reviewed  Nails  Reviewed     Diet Order:   Diet Order            Diet Heart Room service appropriate? Yes; Fluid consistency: Thin  Diet effective now             EDUCATION NEEDS:   Education needs have been addressed  Skin:  Skin Assessment: Skin Integrity Issues:(stg I sacrum (4cm x 4cm))  Last BM:  10/25/2019 per chart  Height:   Ht Readings from Last 1 Encounters:  10/26/19 '5\' 3"'  (1.6 m)   Weight:   Wt Readings from Last 1 Encounters:  10/26/19 87.1 kg   Ideal Body Weight:  52.3 kg  BMI:  Body mass index is 34.01 kg/m.  Estimated Nutritional Needs:   Kcal:  1600-1800  Protein:  80-90 grams  Fluid:  1.6-1.8 L/day  Jacklynn Barnacle, MS, RD, LDN Office: [phone] Pager: [phone] After Hours/Weekend Pager: [phone]

## 2019-10-28 NOTE — Progress Notes (Signed)
D: Pt alert and oriented. Pt denies experiencing any pain at this time. Pt had one episode where BP was lower than their normal BP trend. MD was notified, orders were placed and executed.   A: Scheduled medications administered to pt, per MD orders. Support and encouragement provided. Frequent verbal contact made.  R: No adverse drug reactions noted. Pt complaint with medications and treatment plan. Pt interacts well with staff on the unit. Pt is stable at this time, will continue to monitor and provide care for as ordered.

## 2019-10-28 NOTE — Plan of Care (Signed)

## 2019-10-28 NOTE — Progress Notes (Signed)
[facility] Cardiology [facility] Encounter Note  Patient: Kristen Richard / Admit Date: 10/26/2019 / Date of Encounter: 10/28/2019, 1:28 PM   Subjective: Patient is slowly improving with some physical skills but still having difficulty with some dizziness and concerns for weakness.  No evidence of atrial fibrillation no evidence of heart failure or anginal symptoms Echocardiogram showing normal LV systolic function ejection fraction of 60% and no evidence of significant valvular heart disease  Review of Systems: Positive for: Weakness Negative for: Vision change, hearing change, syncope, dizziness, nausea, vomiting,diarrhea, bloody stool, stomach pain, cough, congestion, diaphoresis, urinary frequency, urinary pain,skin lesions, skin rashes Others previously listed  Objective: Telemetry: Normal sinus rhythm Physical Exam: Blood pressure (!) 162/73, pulse 80, temperature 97.8 F (36.6 C), temperature source Oral, resp. rate 16, height 5\' 3"  (1.6 m), weight 87.1 kg, SpO2 96 %. Body mass index is 34.01 kg/m. General: Well developed, well nourished, in no acute distress. Head: Normocephalic, atraumatic, sclera non-icteric, no xanthomas, nares are without discharge. Neck: No apparent masses Lungs: Normal respirations with no wheezes, no rhonchi, no rales , no crackles   Heart: Regular rate and rhythm, normal S1 S2, no murmur, no rub, no gallop, PMI is normal size and placement, carotid upstroke normal without bruit, jugular venous pressure normal Abdomen: Soft, non-tender, non-distended with normoactive bowel sounds. No hepatosplenomegaly. Abdominal aorta is normal size without bruit Extremities: No edema, no clubbing, no cyanosis, no ulcers,  Peripheral: 2+ radial, 2+ femoral, 2+ dorsal pedal pulses Neuro: Alert and oriented. Moves all extremities spontaneously. Psych:  Responds to questions appropriately with a normal affect.   Intake/Output Summary (Last 24 hours) at 10/28/2019 1328 Last  data filed at 10/28/2019 0906 Gross per 24 hour  Intake 120 ml  Output 350 ml  Net -230 ml    Inpatient Medications:  . aspirin EC  81 mg Oral Daily  . calcium-vitamin D  1 tablet Oral Daily  . dorzolamide-timolol  1 drop Both Eyes BID  . enoxaparin (LOVENOX) injection  40 mg Subcutaneous Q24H  . latanoprost  1 drop Both Eyes QHS  . lisinopril  20 mg Oral Daily  . metoprolol tartrate  25 mg Oral BID  . pantoprazole  40 mg Oral Daily  . simvastatin  40 mg Oral q1800  . [START ON 10/29/2019] Vitamin D (Ergocalciferol)  50,000 Units Oral Weekly   Infusions:   Labs: Recent Labs    10/26/19 1809 10/26/19 1956 10/27/19 0440  NA 136  --  138  K 3.4*  --  4.2  CL 96*  --  101  CO2 25  --  27  GLUCOSE 95  --  68*  BUN 12  --  13  CREATININE 0.72  --  0.70  CALCIUM 9.4  --  8.6*  MG  --  1.7  --    Recent Labs    10/26/19 1809  AST 50*  ALT 21  ALKPHOS 62  BILITOT 1.9*  PROT 7.0  ALBUMIN 3.9   Recent Labs    10/26/19 1809 10/27/19 0440  WBC 12.8* 10.5  NEUTROABS 10.9*  --   HGB 12.8 10.7*  HCT 40.7 32.8*  MCV 91.9 89.9  PLT 362 278   Recent Labs    10/26/19 1809 10/27/19 0440 10/28/19 0436  CKTOTAL 920*  --   --   CKMB  --  18.9* 6.3*   Invalid input(s): POCBNP No results for input(s): HGBA1C in the last 72 hours.   Weights: [employer]  10/26/19 1757 10/26/19 2226  Weight: 79.4 kg 87.1 kg     Radiology/Studies:  DG Lumbar Spine 2-3 Views  Result Date: 10/26/2019 CLINICAL DATA:  Low back pain.  Fall. EXAM: LUMBAR SPINE - 2-3 VIEW COMPARISON:  None. FINDINGS: Mild rightward scoliosis. Diffuse degenerative disc and facet disease, most pronounced in the mid and lower lumbar spine. No fracture or subluxation. SI joints symmetric and unremarkable. IMPRESSION: Degenerative disc and facet disease diffusely, most pronounced from L3-4 through L5-S1. Mild rightward scoliosis. No acute bony abnormality. Electronically Signed   By: Charlett Nose M.D.   On:  10/26/2019 19:01   CT Head Wo Contrast  Result Date: 10/26/2019 CLINICAL DATA:  Head trauma. EXAM: CT HEAD WITHOUT CONTRAST TECHNIQUE: Contiguous axial images were obtained from the base of the skull through the vertex without intravenous contrast. COMPARISON:  06/26/2006 FINDINGS: Brain: Signs of generalized atrophy similar to the prior study. No signs of intracranial hemorrhage, mass effect, midline shift or evidence of hydrocephalus. Vascular: No hyperdense vessel or unexpected calcification. Skull: Normal. Negative for fracture or focal lesion. Sinuses/Orbits: No acute finding. Other: None. IMPRESSION: 1. No acute intracranial abnormality.  Signs of atrophy as before. Electronically Signed   By: Donzetta Kohut M.D.   On: 10/26/2019 18:47   CT Lumbar Spine Wo Contrast  Result Date: 10/26/2019 CLINICAL DATA:  Low back pain after a recent fall. EXAM: CT LUMBAR SPINE WITHOUT CONTRAST TECHNIQUE: Multidetector CT imaging of the lumbar spine was performed without intravenous contrast administration. Multiplanar CT image reconstructions were also generated. COMPARISON:  Lumbar spine radiographs 10/26/2019 FINDINGS: Segmentation: 5 lumbar type vertebrae. Alignment: Mild lumbar dextroscoliosis. Minimal retrolisthesis of L2 on L3, L3 on L4, and L4 on L5. Vertebrae: No acute fracture or destructive osseous process. Subcentimeter sclerotic foci in the L1 and L2 vertebral bodies, possibly bone islands. Paraspinal and other soft tissues: Partially visualized right pleural effusion. Aortic atherosclerosis without aneurysm. Disc levels: Disc space narrowing is mild at L1-2, moderate at L4-5, and severe at L3-4 and L5-S1 with vacuum disc at each of these levels. T12-L1: Mild right facet arthrosis without evidence of stenosis. L1-2: Mild disc bulging without evidence of significant stenosis. L2-3: Circumferential disc bulging and mild-to-moderate facet and ligamentum flavum hypertrophy result in mild spinal stenosis and  bilateral lateral recess stenosis without significant neural foraminal stenosis. L3-4: Circumferential disc bulging and moderate to severe facet and ligamentum flavum hypertrophy result in moderate spinal stenosis, bilateral lateral recess stenosis, and mild bilateral neural foraminal stenosis. L4-5: Circumferential disc bulging and moderate to severe facet and ligamentum flavum hypertrophy result in moderate spinal stenosis, bilateral lateral recess stenosis, and mild-to-moderate right greater than left neural foraminal stenosis. L5-S1: Disc bulging, endplate spurring, and moderate facet hypertrophy result in moderate left greater than right neural foraminal stenosis without spinal stenosis. IMPRESSION: 1. No evidence of acute osseous abnormality. 2. Multilevel lumbar disc and facet degeneration with moderate spinal stenosis at L3-4 and L4-5. 3. Partially visualized right pleural effusion. 4. Aortic Atherosclerosis (ICD10-I70.0). Electronically Signed   By: Sebastian Ache M.D.   On: 10/26/2019 21:45   CT PELVIS WO CONTRAST  Result Date: 10/26/2019 CLINICAL DATA:  Pelvic trauma. Possible urinary tract infection. Severe low back pain. EXAM: CT PELVIS WITHOUT CONTRAST TECHNIQUE: Multidetector CT imaging of the pelvis was performed following the standard protocol without intravenous contrast. COMPARISON:  None. FINDINGS: Urinary Tract:  No abnormality visualized. Bowel:  Unremarkable visualized pelvic bowel loops. Vascular/Lymphatic: There are atherosclerotic changes of the visualized abdominal aorta. Reproductive:  No mass or other significant abnormality Other:  None. Musculoskeletal: No suspicious bone lesions identified. There is osteopenia. IMPRESSION: 1. No CT evidence for acute pelvic abnormality. 2. Atherosclerotic changes of the visualized abdominal aorta Aortic Atherosclerosis (ICD10-I70.0). Electronically Signed   By: Katherine Mantle M.D.   On: 10/26/2019 21:47   MR BRAIN WO CONTRAST  Result Date:  10/27/2019 CLINICAL DATA:  Initial evaluation for acute vertigo, dizziness, recent fall. EXAM: MRI HEAD WITHOUT CONTRAST TECHNIQUE: Multiplanar, multiecho pulse sequences of the brain and surrounding structures were obtained without intravenous contrast. COMPARISON:  Prior head CT from 10/26/2019. FINDINGS: Brain: Diffuse prominence of the CSF containing spaces compatible with generalized age-related cerebral atrophy. Mild patchy T2/FLAIR hyperintensity within the periventricular white matter most consistent with chronic small vessel ischemic disease, mild for age. No abnormal foci of restricted diffusion to suggest acute or subacute ischemia. Gray-white matter differentiation maintained. No encephalomalacia to suggest chronic cortical infarction. No foci of susceptibility artifact to suggest acute or chronic intracranial hemorrhage. No mass lesion, midline shift or mass effect. Diffuse ventriculomegaly, most likely related to global parenchymal volume loss, although a degree of NPH could possibly be contributory. Pituitary gland within normal limits. Midline structures intact. No extra-axial fluid collection. Vascular: Major intracranial vascular flow voids are maintained. Skull and upper cervical spine: Craniocervical junction within normal limits. Bone marrow signal intensity normal. Edema at the left parietal scalp consistent with contusion. Sinuses/Orbits: Patient status post ocular lens replacement on the left. Globes orbital soft tissues otherwise unremarkable. Paranasal sinuses are largely clear. Small left mastoid effusion noted, of doubtful significance. Inner ear structures grossly normal. Other: None. IMPRESSION: 1. No acute intracranial abnormality. 2. Left parietal scalp contusion, likely related to history of recent fall. 3. Age-related cerebral atrophy with mild chronic small vessel ischemic disease. 4. Diffuse ventriculomegaly, most likely related to underlying global parenchymal volume loss. A  degree of NPH could be contributory, and could be considered in the correct clinical setting. Electronically Signed   By: Rise Mu M.D.   On: 10/27/2019 02:56   ECHOCARDIOGRAM COMPLETE  Result Date: 10/28/2019   ECHOCARDIOGRAM REPORT   Patient Name:   Kristen Richard Date of Exam: 10/27/2019 Medical Rec #:  [id number]  Height:       63.0 in Accession #:    [id number] Weight:       192.0 lb Date of Birth:  26-Feb-1936  BSA:          1.90 m Patient Age:    83 years   BP:           170/80 mmHg Patient Gender: F          HR:           89 bpm. Exam Location:  [facility] Procedure: 2D Echo Indications:     ELEVATED TROPONIN                  ATRIAL FIBRILLATION 427.31/I48.91  History:         Patient has no prior history of Echocardiogram examinations.                  Risk Factors:Hypertension and Former Smoker.  Sonographer:     Johnathan Hausen Referring Phys:  [id number] Vernetta Honey MANSY Diagnosing Phys: Arnoldo Hooker MD IMPRESSIONS  1. Left ventricular ejection fraction, by visual estimation, is 60 to 65%. The left ventricle has normal function. There is no left ventricular hypertrophy.  2. The left ventricle has no regional wall motion  abnormalities.  3. Global right ventricle has normal systolic function.The right ventricular size is normal. No increase in right ventricular wall thickness.  4. Left atrial size was mildly dilated.  5. Right atrial size was mildly dilated.  6. The mitral valve is normal in structure. Mild mitral valve regurgitation.  7. The tricuspid valve is normal in structure. Tricuspid valve regurgitation is mild.  8. The aortic valve is normal in structure. Aortic valve regurgitation is trivial.  9. The pulmonic valve was normal in structure. Pulmonic valve regurgitation is not visualized. 10. Moderately elevated pulmonary artery systolic pressure. FINDINGS  Left Ventricle: Left ventricular ejection fraction, by visual estimation, is 60 to 65%. The left ventricle has normal function. The left  ventricle has no regional wall motion abnormalities. There is no left ventricular hypertrophy. Right Ventricle: The right ventricular size is normal. No increase in right ventricular wall thickness. Global RV systolic function is has normal systolic function. The tricuspid regurgitant velocity is 3.20 m/s, and with an assumed right atrial pressure  of 10 mmHg, the estimated right ventricular systolic pressure is moderately elevated at 51.0 mmHg. Left Atrium: Left atrial size was mildly dilated. Right Atrium: Right atrial size was mildly dilated Pericardium: There is no evidence of pericardial effusion. Mitral Valve: The mitral valve is normal in structure. Mild mitral valve regurgitation. Tricuspid Valve: The tricuspid valve is normal in structure. Tricuspid valve regurgitation is mild. Aortic Valve: The aortic valve is normal in structure. Aortic valve regurgitation is trivial. Pulmonic Valve: The pulmonic valve was normal in structure. Pulmonic valve regurgitation is not visualized. Pulmonic regurgitation is not visualized. Aorta: The aortic root, ascending aorta and aortic arch are all structurally normal, with no evidence of dilitation or obstruction. IAS/Shunts: No atrial level shunt detected by color flow Doppler.  LEFT VENTRICLE PLAX 2D LVIDd:         3.92 cm  Diastology LVIDs:         2.32 cm  LV e' lateral:   8.05 cm/s LV PW:         0.75 cm  LV E/e' lateral: 11.3 LV IVS:        0.71 cm  LV e' medial:    8.27 cm/s LVOT diam:     1.60 cm  LV E/e' medial:  11.0 LV SV:         48 ml LV SV Index:   24.04 LVOT Area:     2.01 cm  RIGHT VENTRICLE             IVC RV S prime:     15.20 cm/s  IVC diam: 2.14 cm TAPSE (M-mode): 2.5 cm LEFT ATRIUM             Index       RIGHT ATRIUM           Index LA diam:        4.00 cm 2.10 cm/m  RA Area:     17.40 cm LA Vol (A2C):   64.0 ml 33.68 ml/m RA Volume:   46.80 ml  24.63 ml/m LA Vol (A4C):   83.4 ml 43.88 ml/m LA Biplane Vol: 75.6 ml 39.78 ml/m   AORTA Ao Root diam:  2.70 cm MITRAL VALVE                        TRICUSPID VALVE MV Area (PHT): 3.99 cm             TR Peak  grad:   41.0 mmHg MV PHT:        55.10 msec           TR Vmax:        345.00 cm/s MV Decel Time: 190 msec MV E velocity: 91.20 cm/s 103 cm/s  SHUNTS MV A velocity: 66.50 cm/s 70.3 cm/s Systemic Diam: 1.60 cm MV E/A ratio:  1.37       1.5  Arnoldo Hooker MD Electronically signed by Arnoldo Hooker MD Signature Date/Time: 10/28/2019/12:59:09 PM    Final      Assessment and Recommendation  83 y.o. female with known essential hypertension mixed hyperlipidemia anemia and fall with some vertigo type symptoms slightly improved having paroxysmal nonvalvular atrial fibrillation isolated in nature now resolved and not recurring on appropriate medication management without evidence of myocardial infarction or heart failure 1.  Continue metoprolol for heart rate control blood pressure control and maintenance of normal rhythm 2.  No additional anticoagulation for isolated episode of atrial fibrillation most consistent with situation listed above 3.  Continue hypertension control with medications listed above  4.  Antiplatelet therapy for previous concerns for vascular disease 5.  No further intervention minimal elevation of troponin most consistent with current demand ischemia rather than acute coronary syndrome 6.  Begin ambulation and physical rehabilitation without restriction  Signed, Arnoldo Hooker M.D. FACC

## 2019-10-29 LAB — CKMB (ARMC ONLY): CK, MB: 3.5 ng/mL (ref 0.5–5.0)

## 2019-10-29 NOTE — Plan of Care (Signed)

## 2019-10-29 NOTE — [place] FL2 (Signed)
[place] MEDICAID FL2 LEVEL OF CARE SCREENING TOOL     IDENTIFICATION  Patient Name: Kristen Richard Birthdate: [birth year] Sex: female Admission Date (Current Location): 10/26/2019  [place] and [place] Number:  Chiropodist and Address:  [facility], [address], [place], [place] [address]      Provider Number: [id number]  Attending Physician Name and Address:  Tresa Moore, MD  Relative Name and Phone Number:  [name] [phone]    Current Level of Care: Hospital Recommended Level of Care: Skilled Nursing Facility Prior Approval Number:    Date Approved/Denied:   PASRR Number: [id number]  Discharge Plan: SNF    Current Diagnoses: Patient Active Problem List   Diagnosis Date Noted  . Pressure injury of skin 10/27/2019  . Rhabdomyolysis 10/26/2019    Orientation RESPIRATION BLADDER Height & Weight     Self, Time, Place, Situation  Normal Continent Weight: 87.1 kg Height:  5\' 3"  (160 cm)  BEHAVIORAL SYMPTOMS/MOOD NEUROLOGICAL BOWEL NUTRITION STATUS      Continent Diet  AMBULATORY STATUS COMMUNICATION OF NEEDS Skin   Extensive Assist Verbally Normal                       Personal Care Assistance Level of Assistance  Bathing, Feeding, Dressing Bathing Assistance: Limited assistance Feeding assistance: Independent Dressing Assistance: Limited assistance     Functional Limitations Info             SPECIAL CARE FACTORS FREQUENCY  PT (By licensed PT), OT (By licensed OT)     PT Frequency: 5 times per week OT Frequency: 5 times per week            Contractures Contractures Info: Not present    Additional Factors Info  Code Status, Allergies Code Status Info: Full Allergies Info: sulfa, acetazolamide           Current Medications (10/29/2019):  This is the current hospital active medication list Current Facility-Administered Medications  Medication Dose Route Frequency Provider Last  Rate Last Admin  . acetaminophen (TYLENOL) tablet 650 mg  650 mg Oral Q6H PRN Mansy, Jan A, MD       Or  . acetaminophen (TYLENOL) suppository 650 mg  650 mg Rectal Q6H PRN Mansy, Jan A, MD      . aspirin EC tablet 81 mg  81 mg Oral Daily Mansy, Jan A, MD   81 mg at 10/29/19 0925  . calcium-vitamin D (OSCAL WITH D) 500-200 MG-UNIT per tablet 1 tablet  1 tablet Oral Daily Mansy, Jan A, MD   1 tablet at 10/29/19 0925  . dorzolamide-timolol (COSOPT) 22.3-6.8 MG/ML ophthalmic solution 1 drop  1 drop Both Eyes BID Mansy, Jan A, MD   1 drop at 10/29/19 0925  . enoxaparin (LOVENOX) injection 40 mg  40 mg Subcutaneous Q24H Mansy, Jan A, MD   40 mg at 10/28/19 2250  . feeding supplement (ENSURE ENLIVE) (ENSURE ENLIVE) liquid 237 mL  237 mL Oral BID BM 2251 B, MD   237 mL at 10/29/19 0938  . latanoprost (XALATAN) 0.005 % ophthalmic solution 1 drop  1 drop Both Eyes QHS Mansy, Jan A, MD   1 drop at 10/28/19 2253  . lisinopril (ZESTRIL) tablet 20 mg  20 mg Oral Daily Mansy, Jan A, MD   20 mg at 10/29/19 10/31/19  . magnesium hydroxide (MILK OF MAGNESIA) suspension 30 mL  30 mL Oral Daily PRN Mansy, 7106, MD      .  meclizine (ANTIVERT) tablet 12.5 mg  12.5 mg Oral TID PRN Mansy, Jan A, MD      . menthol-cetylpyridinium (CEPACOL) lozenge 3 mg  1 lozenge Oral PRN Priscella Mann, Sudheer B, MD      . metoprolol tartrate (LOPRESSOR) tablet 25 mg  25 mg Oral BID Mansy, Jan A, MD   25 mg at 10/29/19 8315  . multivitamin with minerals tablet 1 tablet  1 tablet Oral Daily Ralene Muskrat B, MD   1 tablet at 10/29/19 0925  . ondansetron (ZOFRAN) tablet 4 mg  4 mg Oral Q6H PRN Mansy, Jan A, MD       Or  . ondansetron [facility]) injection 4 mg  4 mg Intravenous Q6H PRN Mansy, Jan A, MD      . pantoprazole (PROTONIX) EC tablet 40 mg  40 mg Oral Daily Mansy, Jan A, MD   40 mg at 10/29/19 1761  . phenol (CHLORASEPTIC) mouth spray 1 spray  1 spray Mouth/Throat PRN Sreenath, Sudheer B, MD      . simvastatin (ZOCOR)  tablet 40 mg  40 mg Oral q1800 Mansy, Jan A, MD   40 mg at 10/28/19 1729  . traZODone (DESYREL) tablet 25 mg  25 mg Oral QHS PRN Mansy, Arvella Merles, MD      . Vitamin D (Ergocalciferol) (DRISDOL) capsule 50,000 Units  50,000 Units Oral Weekly Mansy, Arvella Merles, MD   50,000 Units at 10/29/19 6073     Discharge Medications: Please see discharge summary for a list of discharge medications.  Relevant Imaging Results:  Relevant Lab Results:   Additional Information SS# [id number]  Shelbie Hutching, RN

## 2019-10-29 NOTE — Progress Notes (Signed)
This nurse spoke with CM/SW Pryor Montes via secure chat about whether or not pt would need a COVID test. CM/SW agreed that pt would need another test. I then sent a secure chat message to MD Priscella Mann asking him to place order. MD agreed to place order. Will continue to monitor and perform test once order is placed.

## 2019-10-29 NOTE — TOC Progression Note (Signed)
Transition of Care [facility]) - Progression Note    Patient Details  Name: Kristen Richard MRN: [id number] Date of Birth: 09-29-1936  Transition of Care [facility]) CM/SW Contact  Shelbie Hutching, RN Phone Number: 10/29/2019, 4:47 PM  Clinical Narrative:    Patient and daughter would like to choose [facility] for short term skilled nursing.  [facility] was not accepting patient's today due to staffing issues, they may be able to accept tomorrow.  RNCM will follow up with patient and daughter tomorrow.     Expected Discharge Plan: [place]    Expected Discharge Plan and Services Expected Discharge Plan: [place]     Post Acute Care Choice: [place]                                         Social Determinants of Health (SDOH) Interventions    Readmission Risk Interventions No flowsheet data found.

## 2019-10-29 NOTE — Progress Notes (Signed)
PROGRESS NOTE    Kristen Richard  VZD:[id number] DOB: Mar 06, 1936 DOA: 10/26/2019 PCP: [facility]   Brief Narrative:  Kristen Richard  is a 83 y.o. pleasant Caucasian female with a known history of hypertension and vertigo, who presented to the emergency room with acute onset of recent fall a couple of days ago when she got dizzy with vertigo and felt backwards.  She denied any syncope.  She did have head injuries and complained of headache however never lost consciousness.  Headache stayed with her about 3 to 4 hours.  It took her all day to get up from the ground after she fell.  She admits to hip pain only with ambulation.  She was told before she had arrhythmia and was given metoprolol for.  She does not recall history of atrial fibrillation.  She denies any chest pain or palpitations or dyspnea.  No nausea or vomiting or diarrhea.  She has Antivert 25 mg tablets at home but has not used it.  She continued to have dizziness and vertigo today and therefore presented to the ER. Upon presentation to the emergency room, blood pressure was 191/103 with a pulse of 103 respirate of 20 and pulse oximetry 100% on room air.  Labs reveal elevated CK of 920 and high sensitive troponin I of 145, leukocytosis of 12.8 with neutrophilia and mild hypokalemia potassium of 3.4 hypochloremia with chloride 96.  BUN was 12 and creatinine 0.72.  Urinalysis showed 21-50 RBCs and 0-5 WBCs with 30 protein 20 ketones.  Noncontrasted head CT scan revealed no acute intracranial normalities.  It revealed signs of atrophy.  Lumbar spine x-ray showed degenerative disc and facet disease diffusely, most pronounced from L3-L4 through L5-S1 with mild rightward scoliosis with no acute bony abnormality.  EKG showed intermittent atrial fibrillation with a rate of 98.  12/19: No acute status changes.  Pending physical therapy evaluation.  Patient resting comfortably in bed.  12/20: Seen by cardiology. Recommend medical management  of atrial fibrillation. No anticoagulation recommended. Physical therapy evaluated patient. Recommended skilled nursing facility placement. Spoke to the daughter Kristen Richard via phone who is in agreement with this plan.  12/21: No acute status changes available.  Daughter Kristen Richard agreeable to skilled nursing facility placement.  Pending placement.  Patient medically stable for discharge    Assessment & Plan:   Active Problems:   Rhabdomyolysis   Pressure injury of skin  1.  Fall associated with dizziness and vertigo with subsequent acute rhabdomyolysis.   The patient will be admitted to an observation medical monitored bed.   She will be hydrated with IV normal saline.   We will follow her CK and troponin I.   We will continue to monitor her EKG.   Pain management to be provided.   pelvic and lumbar spine CT scan : negative for fractures or acute abnormalities brain MRI: negative for VBI.  Possible NPH noted however suspected volume loss Continue Antivert 12.5 mg 3 times daily Physical therapy recommendation skilled nursing facility [facility] team consulted  2.  Hypertensive urgency.   Home lisinopril 20 daily Home metoprolol 25 BID PRN IV labetalol Improved BP control  3.  Atrial fibrillation possibly of new onset, likely newly diagnosed with control ventricular response.   This could have been contributing to her dizziness.   Per cardiology note patient is likely in A. fib for very short period of time No anticoagulation recommended Metoprolol for rate control and sinus rhythm maintenance Ambulation and rehabilitation  4.  GERD.   PPI therapy will be resumed.  5.  Glaucoma.   Ophthalmic gtt.'s will be continued.  6.  Vitamin D deficiency.   The patient's  vitamin D will be resumed  All acute medical issues requiring inpatient hospitalization resolved at this time. Patient medically stable for discharge to skilled nursing facility. TOC team consulted. Patient is stable for  discharge to skilled nursing facility once appropriate facility is found.   DVT prophylaxis: Lovenox Code Status: Full Family Communication: Spoke to daughter Kristen Richard via phone (10/28/2019)  disposition Plan: Skilled nursing facility   Consultants:   Cardiology-Kowalski  Procedures: None Antimicrobials: None  Subjective: Patient seen and examined No acute events overnight. No new complaints Pending SNF placement  Objective: Vitals:   10/28/19 1514 10/28/19 1658 10/28/19 2249 10/29/19 0744  BP: 96/77 (!) 164/68 (!) 163/69 (!) 156/71  Pulse: 88 82 86 79  Resp:   16 15  Temp: 98.3 F (36.8 C)   98.3 F (36.8 C)  TempSrc: Oral     SpO2: 97% 98% 98% 97%  Weight:      Height:        Intake/Output Summary (Last 24 hours) at 10/29/2019 1116 Last data filed at 10/29/2019 0700 Gross per 24 hour  Intake 240 ml  Output 200 ml  Net 40 ml   Filed Weights   10/26/19 1757 10/26/19 2226  Weight: 79.4 kg 87.1 kg    Examination:  General exam: Appears calm and comfortable  Respiratory system: Clear to auscultation. Respiratory effort normal. Cardiovascular system: S1 & S2 heard, RRR. No JVD, murmurs, rubs, gallops or clicks. No pedal edema. Gastrointestinal system: Abdomen is nondistended, soft and nontender. No organomegaly or masses felt. Normal bowel sounds heard. Central nervous system: Alert and oriented. No focal neurological deficits. Extremities: Symmetric 5 x 5 power. Skin: No rashes, lesions or ulcers Psychiatry: Judgement and insight appear normal. Mood & affect appropriate.     Data Reviewed: I have personally reviewed following labs and imaging studies  CBC: Recent Labs  Lab 10/26/19 1809 10/27/19 0440  WBC 12.8* 10.5  NEUTROABS 10.9*  --   HGB 12.8 10.7*  HCT 40.7 32.8*  MCV 91.9 89.9  PLT 362 563   Basic Metabolic Panel: Recent Labs  Lab 10/26/19 1809 10/26/19 1956 10/27/19 0440  NA 136  --  138  K 3.4*  --  4.2  CL 96*  --  101  CO2  25  --  27  GLUCOSE 95  --  68*  BUN 12  --  13  CREATININE 0.72  --  0.70  CALCIUM 9.4  --  8.6*  MG  --  1.7  --    GFR: Estimated Creatinine Clearance: 57.7 mL/min (by C-G formula based on SCr of 0.7 mg/dL). Liver Function Tests: Recent Labs  Lab 10/26/19 1809  AST 50*  ALT 21  ALKPHOS 62  BILITOT 1.9*  PROT 7.0  ALBUMIN 3.9   No results for input(s): LIPASE, AMYLASE in the last 168 hours. No results for input(s): AMMONIA in the last 168 hours. Coagulation Profile: No results for input(s): INR, PROTIME in the last 168 hours. Cardiac Enzymes: Recent Labs  Lab 10/26/19 1809 10/27/19 0440 10/28/19 0436 10/29/19 0522  CKTOTAL 920*  --   --   --   CKMB  --  18.9* 6.3* 3.5   BNP (last 3 results) No results for input(s): PROBNP in the last 8760 hours. HbA1C: No results for input(s): HGBA1C in the last  72 hours. CBG: No results for input(s): GLUCAP in the last 168 hours. Lipid Profile: Recent Labs    10/27/19 0440  CHOL 149  HDL 64  LDLCALC 72  TRIG 65  CHOLHDL 2.3   Thyroid Function Tests: No results for input(s): TSH, T4TOTAL, FREET4, T3FREE, THYROIDAB in the last 72 hours. Anemia Panel: No results for input(s): VITAMINB12, FOLATE, FERRITIN, TIBC, IRON, RETICCTPCT in the last 72 hours. Sepsis Labs: No results for input(s): PROCALCITON, LATICACIDVEN in the last 168 hours.  Recent Results (from the past 240 hour(s))  SARS CORONAVIRUS 2 (TAT 6-24 HRS) Nasopharyngeal Nasopharyngeal Swab     Status: None   Collection Time: 10/26/19  7:38 PM   Specimen: Nasopharyngeal Swab  Result Value Ref Range Status   SARS Coronavirus 2 NEGATIVE NEGATIVE Final    Comment: (NOTE) SARS-CoV-2 target nucleic acids are NOT DETECTED. The SARS-CoV-2 RNA is generally detectable in upper and lower respiratory specimens during the acute phase of infection. Negative results do not preclude SARS-CoV-2 infection, do not rule out co-infections with other pathogens, and should not be  used as the sole basis for treatment or other patient management decisions. Negative results must be combined with clinical observations, patient history, and epidemiological information. The expected result is Negative. Fact Sheet for Patients: HairSlick.no Fact Sheet for Healthcare Providers: quierodirigir.com This test is not yet approved or cleared by the [place] FDA and  has been authorized for detection and/or diagnosis of SARS-CoV-2 by FDA under an Emergency Use Authorization (EUA). This EUA will remain  in effect (meaning this test can be used) for the duration of the COVID-19 declaration under Section 56 4(b)(1) of the Act, 21 U.S.C. section 360bbb-3(b)(1), unless the authorization is terminated or revoked sooner. Performed at [facility], [address], [place], [place] [address]          Radiology Studies: ECHOCARDIOGRAM COMPLETE  Result Date: 10/28/2019   ECHOCARDIOGRAM REPORT   Patient Name:   Kristen Richard Date of Exam: 10/27/2019 Medical Rec #:  [id number]  Height:       63.0 in Accession #:    [id number] Weight:       192.0 lb Date of Birth:  04-27-1936  BSA:          1.90 m Patient Age:    83 years   BP:           170/80 mmHg Patient Gender: F          HR:           89 bpm. Exam Location:  [facility] Procedure: 2D Echo Indications:     ELEVATED TROPONIN                  ATRIAL FIBRILLATION 427.31/I48.91  History:         Patient has no prior history of Echocardiogram examinations.                  Risk Factors:Hypertension and Former Smoker.  Sonographer:     Johnathan Hausen Referring Phys:  [id number] Vernetta Honey MANSY Diagnosing Phys: Arnoldo Hooker MD IMPRESSIONS  1. Left ventricular ejection fraction, by visual estimation, is 60 to 65%. The left ventricle has normal function. There is no left ventricular hypertrophy.  2. The left ventricle has no regional wall motion abnormalities.  3. Global right ventricle has normal  systolic function.The right ventricular size is normal. No increase in right ventricular wall thickness.  4. Left atrial size was mildly  dilated.  5. Right atrial size was mildly dilated.  6. The mitral valve is normal in structure. Mild mitral valve regurgitation.  7. The tricuspid valve is normal in structure. Tricuspid valve regurgitation is mild.  8. The aortic valve is normal in structure. Aortic valve regurgitation is trivial.  9. The pulmonic valve was normal in structure. Pulmonic valve regurgitation is not visualized. 10. Moderately elevated pulmonary artery systolic pressure. FINDINGS  Left Ventricle: Left ventricular ejection fraction, by visual estimation, is 60 to 65%. The left ventricle has normal function. The left ventricle has no regional wall motion abnormalities. There is no left ventricular hypertrophy. Right Ventricle: The right ventricular size is normal. No increase in right ventricular wall thickness. Global RV systolic function is has normal systolic function. The tricuspid regurgitant velocity is 3.20 m/s, and with an assumed right atrial pressure  of 10 mmHg, the estimated right ventricular systolic pressure is moderately elevated at 51.0 mmHg. Left Atrium: Left atrial size was mildly dilated. Right Atrium: Right atrial size was mildly dilated Pericardium: There is no evidence of pericardial effusion. Mitral Valve: The mitral valve is normal in structure. Mild mitral valve regurgitation. Tricuspid Valve: The tricuspid valve is normal in structure. Tricuspid valve regurgitation is mild. Aortic Valve: The aortic valve is normal in structure. Aortic valve regurgitation is trivial. Pulmonic Valve: The pulmonic valve was normal in structure. Pulmonic valve regurgitation is not visualized. Pulmonic regurgitation is not visualized. Aorta: The aortic root, ascending aorta and aortic arch are all structurally normal, with no evidence of dilitation or obstruction. IAS/Shunts: No atrial level shunt  detected by color flow Doppler.  LEFT VENTRICLE PLAX 2D LVIDd:         3.92 cm  Diastology LVIDs:         2.32 cm  LV e' lateral:   8.05 cm/s LV PW:         0.75 cm  LV E/e' lateral: 11.3 LV IVS:        0.71 cm  LV e' medial:    8.27 cm/s LVOT diam:     1.60 cm  LV E/e' medial:  11.0 LV SV:         48 ml LV SV Index:   24.04 LVOT Area:     2.01 cm  RIGHT VENTRICLE             IVC RV S prime:     15.20 cm/s  IVC diam: 2.14 cm TAPSE (M-mode): 2.5 cm LEFT ATRIUM             Index       RIGHT ATRIUM           Index LA diam:        4.00 cm 2.10 cm/m  RA Area:     17.40 cm LA Vol (A2C):   64.0 ml 33.68 ml/m RA Volume:   46.80 ml  24.63 ml/m LA Vol (A4C):   83.4 ml 43.88 ml/m LA Biplane Vol: 75.6 ml 39.78 ml/m   AORTA Ao Root diam: 2.70 cm MITRAL VALVE                        TRICUSPID VALVE MV Area (PHT): 3.99 cm             TR Peak grad:   41.0 mmHg MV PHT:        55.10 msec           TR Vmax:  345.00 cm/s MV Decel Time: 190 msec MV E velocity: 91.20 cm/s 103 cm/s  SHUNTS MV A velocity: 66.50 cm/s 70.3 cm/s Systemic Diam: 1.60 cm MV E/A ratio:  1.37       1.5  Arnoldo HookerBruce Kowalski MD Electronically signed by Arnoldo HookerBruce Kowalski MD Signature Date/Time: 10/28/2019/12:59:09 PM    Final         Scheduled Meds: . aspirin EC  81 mg Oral Daily  . calcium-vitamin D  1 tablet Oral Daily  . dorzolamide-timolol  1 drop Both Eyes BID  . enoxaparin (LOVENOX) injection  40 mg Subcutaneous Q24H  . feeding supplement (ENSURE ENLIVE)  237 mL Oral BID BM  . latanoprost  1 drop Both Eyes QHS  . lisinopril  20 mg Oral Daily  . metoprolol tartrate  25 mg Oral BID  . multivitamin with minerals  1 tablet Oral Daily  . pantoprazole  40 mg Oral Daily  . simvastatin  40 mg Oral q1800  . Vitamin D (Ergocalciferol)  50,000 Units Oral Weekly   Continuous Infusions:    LOS: 2 days    Time spent: 35 minutes    Tresa MooreSudheer B Lori Popowski, MD [facility] Pager [phone]  If 7PM-7AM, please contact  night-coverage [email or web address] Password [email or web address] 10/29/2019, 11:16 AM

## 2019-10-30 LAB — BASIC METABOLIC PANEL
Anion gap: 10 (ref 5–15)
BUN: 9 mg/dL (ref 8–23)
CO2: 27 mmol/L (ref 22–32)
Calcium: 8.5 mg/dL — ABNORMAL LOW (ref 8.9–10.3)
Chloride: 104 mmol/L (ref 98–111)
Creatinine, Ser: 0.7 mg/dL (ref 0.44–1.00)
GFR calc Af Amer: >60 mL/min (ref >60)
GFR calc non Af Amer: >60 mL/min (ref >60)
Glucose, Bld: 94 mg/dL (ref 70–99)
Potassium: 3.2 mmol/L — ABNORMAL LOW (ref 3.5–5.1)
Sodium: 141 mmol/L (ref 135–145)

## 2019-10-30 LAB — SARS CORONAVIRUS 2 (TAT 6-24 HRS): SARS Coronavirus 2: NEGATIVE

## 2019-10-30 MED ORDER — LABETALOL HCL 5 MG/ML IV SOLN: 10.0000 mg | INTRAVENOUS | Status: DC | PRN

## 2019-10-30 MED ORDER — LISINOPRIL 20 MG PO TABS: 40.0000 mg | ORAL_TABLET | Freq: Every day | ORAL | Status: DC

## 2019-10-30 MED ORDER — LISINOPRIL 40 MG PO TABS: 40.0000 mg | ORAL_TABLET | Freq: Every day | ORAL | Status: DC

## 2019-10-30 NOTE — Progress Notes (Addendum)
Physical Therapy Treatment Patient Details Name: Kristen Richard MRN: [id number] DOB: 05/18/1936 Today's Date: 10/30/2019    History of Present Illness Pt is an 83 year old F admitted with rhabdomyolysis after a fall in her home.  PMH includes vertigo, hypertension and falls.    PT Comments    Pt ready to try this am.  Stated she is feeling better overall.  To edge of bed with heavy use of rails and min a x 1.  Once sitting, she has difficulty maintaining sitting balance and holds walker handles with walker braced by Probation officer for support.  After 10 minutes or so balance does improve to supervision without support.  She is able to stand x 1 with mod a x 1 with heavy reliance on walker.  She is unable to step in place.  +2 called for transfer to commode as she feels she needs to have a BM but once on commode urge is gone.  Transferred to recliner after session and remained up.  Pt with c/o dizziness which she feels is primary barrier.  Pt stated she hopes to return home vs SNF.  At this time she continues to need +2 for all transfers for safety.  She stated she has all equipment at home including a RW, commode, wheelchair and hospital bed from her husband.  If family can provide +2 for transfers, home is realistic.  Discussed with care management.  SNF remains appropriate if available and she chooses.     Follow Up Recommendations  SNF;Other (comment)     Equipment Recommendations  None recommended by PT    Recommendations for Other Services       Precautions / Restrictions Precautions Precautions: Fall Restrictions Weight Bearing Restrictions: No    Mobility  Bed Mobility Overal bed mobility: Needs Assistance Bed Mobility: Supine to Sit     Supine to sit: Mod assist        Transfers Overall transfer level: Needs assistance Equipment used: Rolling walker (2 wheeled) Transfers: Sit to/from Stand Sit to Stand: Min assist;Mod assist             Ambulation/Gait Ambulation/Gait assistance: Min assist;Mod assist;+2 physical assistance Gait Distance (Feet): 3 Feet Assistive device: Rolling walker (2 wheeled) Gait Pattern/deviations: Step-to pattern;Trunk flexed;Decreased step length - right;Decreased step length - left Gait velocity: decreased   General Gait Details: short shuffling steps with encouragement   Stairs             Wheelchair Mobility    Modified Rankin (Stroke Patients Only)       Balance Overall balance assessment: Needs assistance Sitting-balance support: Bilateral upper extremity supported;Feet supported Sitting balance-Leahy Scale: Poor     Standing balance support: Bilateral upper extremity supported Standing balance-Leahy Scale: Poor                              Cognition Arousal/Alertness: Awake/alert Behavior During Therapy: WFL for tasks assessed/performed Overall Cognitive Status: Within Functional Limits for tasks assessed                                        Exercises Other Exercises Other Exercises: sat EOB x 10 minutes.  some seated LAQ and ankle pumps but pt with post LOB    General Comments        Pertinent Vitals/Pain Pain Assessment: No/denies pain  Home Living                      Prior Function            PT Goals (current goals can now be found in the care plan section) Progress towards PT goals: Progressing toward goals    Frequency    Min 2X/week      PT Plan Current plan remains appropriate    Co-evaluation              AM-PAC PT "6 Clicks" Mobility   Outcome Measure  Help needed turning from your back to your side while in a flat bed without using bedrails?: A Lot Help needed moving from lying on your back to sitting on the side of a flat bed without using bedrails?: A Lot Help needed moving to and from a bed to a chair (including a wheelchair)?: A Lot Help needed standing up from a chair  using your arms (e.g., wheelchair or bedside chair)?: A Lot Help needed to walk in hospital room?: A Lot Help needed climbing 3-5 steps with a railing? : Total 6 Click Score: 11    End of Session Equipment Utilized During Treatment: Gait belt Activity Tolerance: Patient tolerated treatment well Patient left: in chair;with call bell/phone within reach;with chair alarm set Nurse Communication: Mobility status       Time: 0177-9390 PT Time Calculation (min) (ACUTE ONLY): 24 min  Charges:  $Gait Training: 8-22 mins $Therapeutic Exercise: 8-22 mins                    Danielle Dess, PTA 10/30/19, 12:16 PM

## 2019-10-30 NOTE — Progress Notes (Addendum)
Patient's BP 190/98, HR 80 upon discharge with EMS. Notified on call provider Sharion Settler NP, discussed BP. Advised to give scheduled metoprolol and discharge patient. Notified [facility] facility, spoke with "Caryl Pina" of BP/ medication given. Explained to EMT as well.

## 2019-10-30 NOTE — Progress Notes (Signed)
PROGRESS NOTE    Kristen Richard  TTS:[id number] DOB: May 23, 1936 DOA: 10/26/2019 PCP: [facility]   Brief Narrative:  Kristen Richard  is a 83 y.o. pleasant Caucasian female with a known history of hypertension and vertigo, who presented to the emergency room with acute onset of recent fall a couple of days ago when she got dizzy with vertigo and felt backwards.  She denied any syncope.  She did have head injuries and complained of headache however never lost consciousness.  Headache stayed with her about 3 to 4 hours.  It took her all day to get up from the ground after she fell.  She admits to hip pain only with ambulation.  She was told before she had arrhythmia and was given metoprolol for.  She does not recall history of atrial fibrillation.  She denies any chest pain or palpitations or dyspnea.  No nausea or vomiting or diarrhea.  She has Antivert 25 mg tablets at home but has not used it.  She continued to have dizziness and vertigo today and therefore presented to the ER. Upon presentation to the emergency room, blood pressure was 191/103 with a pulse of 103 respirate of 20 and pulse oximetry 100% on room air.  Labs reveal elevated CK of 920 and high sensitive troponin I of 145, leukocytosis of 12.8 with neutrophilia and mild hypokalemia potassium of 3.4 hypochloremia with chloride 96.  BUN was 12 and creatinine 0.72.  Urinalysis showed 21-50 RBCs and 0-5 WBCs with 30 protein 20 ketones.  Noncontrasted head CT scan revealed no acute intracranial normalities.  It revealed signs of atrophy.  Lumbar spine x-ray showed degenerative disc and facet disease diffusely, most pronounced from L3-L4 through L5-S1 with mild rightward scoliosis with no acute bony abnormality.  EKG showed intermittent atrial fibrillation with a rate of 98.  12/19: No acute status changes.  Pending physical therapy evaluation.  Patient resting comfortably in bed.  12/20: Seen by cardiology. Recommend medical management  of atrial fibrillation. No anticoagulation recommended. Physical therapy evaluated patient. Recommended skilled nursing facility placement. Spoke to the daughter [name] via phone who is in agreement with this plan.  12/21: No acute status changes available.  Daughter [name] agreeable to skilled nursing facility placement.  Pending placement.  Patient medically stable for discharge  12/22: No acute status changes.  Patient resting comfortably in bed.  Pending skilled nursing facility placement..  Patient medically stable for discharge    Assessment & Plan:   Active Problems:   Rhabdomyolysis   Pressure injury of skin  1.  Fall associated with dizziness and vertigo with subsequent acute rhabdomyolysis.   The patient will be admitted to an observation medical monitored bed.   She will be hydrated with IV normal saline.   We will follow her CK and troponin I.   We will continue to monitor her EKG.   Pain management to be provided.   pelvic and lumbar spine CT scan : negative for fractures or acute abnormalities brain MRI: negative for VBI.  Possible NPH noted however suspected volume loss Continue Antivert 12.5 mg 3 times daily Physical therapy recommendation skilled nursing facility [facility] team consulted  2.  Hypertensive urgency.   Increase home lisinopril 40 mg daily Home metoprolol 25 BID PRN IV labetalol Improved BP control  3.  Atrial fibrillation possibly of new onset, likely newly diagnosed with control ventricular response.   This could have been contributing to her dizziness.   Per cardiology note patient is  likely in A. fib for very short period of time No anticoagulation recommended Metoprolol for rate control and sinus rhythm maintenance Ambulation and rehabilitation  4.  GERD.   PPI therapy will be resumed.  5.  Glaucoma.   Ophthalmic gtt.'s will be continued.  6.  Vitamin D deficiency.   The patient's  vitamin D will be resumed  All acute medical issues  requiring inpatient hospitalization resolved at this time. Patient medically stable for discharge to skilled nursing facility. TOC team consulted. Patient is stable for discharge to skilled nursing facility once appropriate facility is found.  Repeat Covid test ordered on 10/29/2019, negative   DVT prophylaxis: Lovenox Code Status: Full Family Communication: Spoke to daughter [name] via phone (10/28/2019)  disposition Plan: Skilled nursing facility   Consultants:   Cardiology-Kowalski  Procedures: None Antimicrobials: None  Subjective: Patient seen and examined No acute events overnight. No new complaints Pending SNF placement  Objective: Vitals:   10/29/19 1518 10/29/19 2303 10/30/19 0200 10/30/19 0750  BP: (!) 161/70 (!) 188/84 (!) 177/67 (!) 190/81  Pulse: 82 88 82 84  Resp: Temp: 98.3 F (36.8 C) 99 F (37.2 C)  98 F (36.7 C)  TempSrc:  Oral  Oral  SpO2: 98% 97% 97% 96%  Weight:      Height:        Intake/Output Summary (Last 24 hours) at 10/30/2019 1234 Last data filed at 10/30/2019 0956 Gross per 24 hour  Intake 240 ml  Output 1401 ml  Net -1161 ml   Filed Weights   10/26/19 1757 10/26/19 2226  Weight: 79.4 kg 87.1 kg    Examination:  General exam: Appears calm and comfortable  Respiratory system: Clear to auscultation. Respiratory effort normal. Cardiovascular system: S1 & S2 heard, RRR. No JVD, murmurs, rubs, gallops or clicks. No pedal edema. Gastrointestinal system: Abdomen is nondistended, soft and nontender. No organomegaly or masses felt. Normal bowel sounds heard. Central nervous system: Alert and oriented. No focal neurological deficits. Extremities: Symmetric 5 x 5 power. Skin: No rashes, lesions or ulcers Psychiatry: Judgement and insight appear normal. Mood & affect appropriate.     Data Reviewed: I have personally reviewed following labs and imaging studies  CBC: Recent Labs  Lab 10/26/19 1809 10/27/19 0440  WBC  12.8* 10.5  NEUTROABS 10.9*  --   HGB 12.8 10.7*  HCT 40.7 32.8*  MCV 91.9 89.9  PLT 362 278   Basic Metabolic Panel: Recent Labs  Lab 10/26/19 1809 10/26/19 1956 10/27/19 0440 10/30/19 0518  NA 136  --  138 141  K 3.4*  --  4.2 3.2*  CL 96*  --  101 104  CO2 25  --  27 27  GLUCOSE 95  --  68* 94  BUN 12  --  13 9  CREATININE 0.72  --  0.70 0.70  CALCIUM 9.4  --  8.6* 8.5*  MG  --  1.7  --   --    GFR: Estimated Creatinine Clearance: 57.7 mL/min (by C-G formula based on SCr of 0.7 mg/dL). Liver Function Tests: Recent Labs  Lab 10/26/19 1809  AST 50*  ALT 21  ALKPHOS 62  BILITOT 1.9*  PROT 7.0  ALBUMIN 3.9   No results for input(s): LIPASE, AMYLASE in the last 168 hours. No results for input(s): AMMONIA in the last 168 hours. Coagulation Profile: No results for input(s): INR, PROTIME in the last 168 hours. Cardiac Enzymes: Recent Labs  Lab 10/26/19  1809 10/27/19 0440 10/28/19 0436 10/29/19 0522  CKTOTAL 920*  --   --   --   CKMB  --  18.9* 6.3* 3.5   BNP (last 3 results) No results for input(s): PROBNP in the last 8760 hours. HbA1C: No results for input(s): HGBA1C in the last 72 hours. CBG: No results for input(s): GLUCAP in the last 168 hours. Lipid Profile: No results for input(s): CHOL, HDL, LDLCALC, TRIG, CHOLHDL, LDLDIRECT in the last 72 hours. Thyroid Function Tests: No results for input(s): TSH, T4TOTAL, FREET4, T3FREE, THYROIDAB in the last 72 hours. Anemia Panel: No results for input(s): VITAMINB12, FOLATE, FERRITIN, TIBC, IRON, RETICCTPCT in the last 72 hours. Sepsis Labs: No results for input(s): PROCALCITON, LATICACIDVEN in the last 168 hours.  Recent Results (from the past 240 hour(s))  SARS CORONAVIRUS 2 (TAT 6-24 HRS) Nasopharyngeal Nasopharyngeal Swab     Status: None   Collection Time: 10/26/19  7:38 PM   Specimen: Nasopharyngeal Swab  Result Value Ref Range Status   SARS Coronavirus 2 NEGATIVE NEGATIVE Final    Comment:  (NOTE) SARS-CoV-2 target nucleic acids are NOT DETECTED. The SARS-CoV-2 RNA is generally detectable in upper and lower respiratory specimens during the acute phase of infection. Negative results do not preclude SARS-CoV-2 infection, do not rule out co-infections with other pathogens, and should not be used as the sole basis for treatment or other patient management decisions. Negative results must be combined with clinical observations, patient history, and epidemiological information. The expected result is Negative. Fact Sheet for Patients: SugarRoll.be Fact Sheet for Healthcare Providers: https://www.woods-mathews.com/ This test is not yet approved or cleared by the [place] FDA and  has been authorized for detection and/or diagnosis of SARS-CoV-2 by FDA under an Emergency Use Authorization (EUA). This EUA will remain  in effect (meaning this test can be used) for the duration of the COVID-19 declaration under Section 56 4(b)(1) of the Act, 21 U.S.C. section 360bbb-3(b)(1), unless the authorization is terminated or revoked sooner. Performed at [facility], [place] [address] [place], [place] [address]   SARS CORONAVIRUS 2 (TAT 6-24 HRS) Nasopharyngeal Nasopharyngeal Swab     Status: None   Collection Time: 10/29/19  6:04 PM   Specimen: Nasopharyngeal Swab  Result Value Ref Range Status   SARS Coronavirus 2 NEGATIVE NEGATIVE Final    Comment: (NOTE) SARS-CoV-2 target nucleic acids are NOT DETECTED. The SARS-CoV-2 RNA is generally detectable in upper and lower respiratory specimens during the acute phase of infection. Negative results do not preclude SARS-CoV-2 infection, do not rule out co-infections with other pathogens, and should not be used as the sole basis for treatment or other patient management decisions. Negative results must be combined with clinical observations, patient history, and epidemiological information. The  expected result is Negative. Fact Sheet for Patients: SugarRoll.be Fact Sheet for Healthcare Providers: https://www.woods-mathews.com/ This test is not yet approved or cleared by the [place] FDA and  has been authorized for detection and/or diagnosis of SARS-CoV-2 by FDA under an Emergency Use Authorization (EUA). This EUA will remain  in effect (meaning this test can be used) for the duration of the COVID-19 declaration under Section 56 4(b)(1) of the Act, 21 U.S.C. section 360bbb-3(b)(1), unless the authorization is terminated or revoked sooner. Performed at [facility], [place] [address], [place], [place] [address]          Radiology Studies: No results found.      Scheduled Meds: . aspirin EC  81  mg Oral Daily  . calcium-vitamin D  1 tablet Oral Daily  . dorzolamide-timolol  1 drop Both Eyes BID  . enoxaparin (LOVENOX) injection  40 mg Subcutaneous Q24H  . feeding supplement (ENSURE ENLIVE)  237 mL Oral BID BM  . latanoprost  1 drop Both Eyes QHS  . [START ON 10/31/2019] lisinopril  40 mg Oral Daily  . metoprolol tartrate  25 mg Oral BID  . multivitamin with minerals  1 tablet Oral Daily  . pantoprazole  40 mg Oral Daily  . simvastatin  40 mg Oral q1800  . Vitamin D (Ergocalciferol)  50,000 Units Oral Weekly   Continuous Infusions:    LOS: 3 days    Time spent: 35 minutes    Tresa MooreSudheer B Addilyne Backs, MD [facility] Pager [phone]  If 7PM-7AM, please contact night-coverage [email or web address] Password [facility] 10/30/2019, 12:34 PM

## 2019-10-30 NOTE — TOC Transition Note (Signed)
Transition of Care [facility]) - CM/SW Discharge Note   Patient Details  Name: Kristen Richard MRN: [id number] Date of Birth: [birth year]  [facility] CM/SW Contact:  Shelbie Hutching, RN Phone Number: 10/30/2019, 1:03 PM   Clinical Narrative:    Kristen Richard was unable to accept patient but [facility] has offered a bed and the patient and daughter would like to accept that bed.  Patient will discharge to [facility] today.  Patient will transport via [employer].  Bedside RN will call report to [phone].   Final next level of care: Skilled Nursing Facility Barriers to Discharge: Barriers Resolved   Patient Goals and CMS Choice   CMS Medicare.gov Compare Post Acute Care list provided to:: Patient Choice offered to / list presented to : Patient, Adult Children  Discharge Placement              Patient chooses bed at: [facility] Patient to be transferred to facility by: [facility] Name of family member notified: Kristen Richard- daughter Patient and family notified of of transfer: 10/30/19  Discharge Plan and Services     Post Acute Care Choice: [facility]                               Social Determinants of Health (SDOH) Interventions     Readmission Risk Interventions No flowsheet data found.

## 2019-10-30 NOTE — Progress Notes (Signed)
Report called and given to [place] at [facility]. IV removed. I am about to call EMS. Patient's daughter is at bedside.

## 2019-10-30 NOTE — Discharge Summary (Signed)
Physician Discharge Summary  Kristen Richard ZOX:[id number] DOB: 30-Jun-1936 DOA: 10/26/2019  [facility]P: [facility]  Admit date: 10/26/2019 Discharge date: 10/30/2019  Admitted From: Home Disposition: Skilled nursing facility  Recommendations for Outpatient Follow-up:  1. Follow up with PCP in 1-2 weeks   Home Health: No Equipment/Devices: None  Discharge Condition: Stable CODE STATUS: Full Diet recommendation: Heart healthy  Brief/Interim Summary: [name] a83 y.o.pleasant Caucasian femalewith a known history ofhypertension and vertigo, who presented to the emergency room with acute onset of recent fall a couple of days ago when she got dizzywith vertigoand felt backwards. She denied any syncope. She did have head injuries and complained of headache however never lost consciousness. Headache stayed with her about 3 to 4 hours. It took her all day to get up from the ground after she fell. She admits to hip pain only with ambulation. She was told before she had arrhythmia and was given metoprolol for. She does not recall history of atrial fibrillation. She denies any chest pain or palpitations or dyspnea. No nausea or vomitingordiarrhea.She has Antivert 25 mg tablets at home but has not used it. She continued to have dizziness and vertigo today and therefore presented to the ER. Upon presentationto the emergency room, blood pressure was 191/103 with a pulse of 103 respirate of 20 and pulse oximetry 100% on room air. Labs reveal elevated CK of 920 and high sensitive troponin I of 145, leukocytosis of 12.8 with neutrophilia and mild hypokalemia potassium of 3.4 hypochloremia with chloride 96. BUN was 12 and creatinine 0.72. Urinalysis showed 21-50 RBCs and 0-5 WBCs with 30 protein 20 ketones. Noncontrasted head CT scan revealed no acute intracranial normalities. It revealed signs of atrophy. Lumbar spine x-ray showed degenerative disc and facet disease  diffusely, most pronounced from L3-L4 through L5-S1 with mild rightward scoliosis with no acute bony abnormality. EKG showed intermittent atrial fibrillation with a rate of 98.  12/19: No acute status changes.  Pending physical therapy evaluation.  Patient resting comfortably in bed.  12/20: Seen by cardiology. Recommend medical management of atrial fibrillation. No anticoagulation recommended. Physical therapy evaluated patient. Recommended skilled nursing facility placement. Spoke to the daughter Kristen Richard via phone who is in agreement with this plan.  12/21: No acute status changes available.  Daughter Kristen Richard agreeable to skilled nursing facility placement.  Pending placement.  Patient medically stable for discharge   Discharge Diagnoses:  Active Problems:   Rhabdomyolysis   Pressure injury of skin  1. Fall associated with dizziness and vertigowith subsequent acute rhabdomyolysis. Unclear nature of the fall Suspect some element of orthostasis however orthostatic vitals are negative  Medical work-up overall negative  Possible contribution from underlying newly diagnosed atrial fibrillation  Imaging survey reassuring  pelvic and lumbar spine CT scan :negativefor fractures or acute abnormalities brain MRI: negative for VBI.  Possible NPH noted however suspected volume loss Continue Antivert 12.5 mg 3 times daily, continue on discharge Physical therapy recommendation skilled nursing facility [facility] team consulted  2. Hypertensiveurgency. Increase lisinopril dose to  QD Home metoprolol 25 BID PRN IV labetalol Improved BP control Can continue home regimen on discharge  3. Atrial fibrillation possibly of new onset, likely newly diagnosed with control ventricular response.  This could have been contributing to her dizziness.  Per cardiology note patient is likely in A. fib for very short period of time No anticoagulation recommended Metoprolol for rate control and  sinus rhythm maintenance Ambulation and rehabilitation  4. GERD. PPI therapy will be resumed.  5. Glaucoma.  Ophthalmic gtt.'s will be continued.  6. Vitamin D deficiency.  Thepatient'svitamin D will be resumed  Discharge Instructions  Discharge Instructions    Diet - low sodium heart healthy   Complete by: As directed    Increase activity slowly   Complete by: As directed      Allergies as of 10/30/2019      Reactions   Sulfa Antibiotics Swelling   Pt was told it caused her brain to swell - [facility] documented in 2014, pt is unaware of this    Acetazolamide Nausea And Vomiting   Dizziness       Medication List    TAKE these medications   acetaminophen 325 MG tablet Commonly known as: TYLENOL Take 325-650 mg by mouth every 6 (six) hours as needed for moderate pain or headache.   CALCIUM + D PO Take 1 tablet by mouth daily.   diphenhydrAMINE 25 MG tablet Commonly known as: BENADRYL Take 25 mg by mouth daily as needed for allergies.   dorzolamide-timolol 22.3-6.8 MG/ML ophthalmic solution Commonly known as: COSOPT Place 1 drop into both eyes 2 (two) times daily.   lisinopril 20 MG tablet Commonly known as: ZESTRIL Take 20 mg by mouth daily. What changed: Another medication with the same name was added. Make sure you understand how and when to take each.   lisinopril 40 MG tablet Commonly known as: ZESTRIL Take 1 tablet (40 mg total) by mouth daily. Start taking on: October 31, 2019 What changed: You were already taking a medication with the same name, and this prescription was added. Make sure you understand how and when to take each.   Lumigan 0.01 % Soln Generic drug: bimatoprost Place 1 drop into both eyes at bedtime.   meclizine 12.5 MG tablet Commonly known as: ANTIVERT Take 1 tablet (12.5 mg total) by mouth 3 (three) times daily as needed for dizziness (Or vertigo).   metoprolol tartrate 25 MG tablet Commonly known as: LOPRESSOR Take 25  mg by mouth 2 (two) times daily.   omeprazole 20 MG capsule Commonly known as: PRILOSEC Take 20 mg by mouth daily.   Vitamin D (Ergocalciferol) 1.25 MG (50000 UT) Caps capsule Commonly known as: DRISDOL Take 50,000 Units by mouth once a week.      Contact information for after-discharge care    Destination    [facility] .   Service: Skilled Nursing Contact information: [address] [place] [id number] [phone]             Allergies  Allergen Reactions  . Sulfa Antibiotics Swelling    Pt was told it caused her brain to swell - [facility] documented in 2014, pt is unaware of this   . Acetazolamide Nausea And Vomiting    Dizziness     Consultations:  Cardiology-[facility]   Procedures/Studies: DG Lumbar Spine 2-3 Views  Result Date: 10/26/2019 CLINICAL DATA:  Low back pain.  Fall. EXAM: LUMBAR SPINE - 2-3 VIEW COMPARISON:  None. FINDINGS: Mild rightward scoliosis. Diffuse degenerative disc and facet disease, most pronounced in the mid and lower lumbar spine. No fracture or subluxation. SI joints symmetric and unremarkable. IMPRESSION: Degenerative disc and facet disease diffusely, most pronounced from L3-4 through L5-S1. Mild rightward scoliosis. No acute bony abnormality. Electronically Signed   By: Charlett NoseKevin  Dover M.D.   On: 10/26/2019 19:01   CT Head Wo Contrast  Result Date: 10/26/2019 CLINICAL DATA:  Head trauma. EXAM: CT HEAD WITHOUT CONTRAST  TECHNIQUE: Contiguous axial images were obtained from the base of the skull through the vertex without intravenous contrast. COMPARISON:  06/26/2006 FINDINGS: Brain: Signs of generalized atrophy similar to the prior study. No signs of intracranial hemorrhage, mass effect, midline shift or evidence of hydrocephalus. Vascular: No hyperdense vessel or unexpected calcification. Skull: Normal. Negative for fracture or focal lesion. Sinuses/Orbits: No acute finding. Other: None. IMPRESSION:  1. No acute intracranial abnormality.  Signs of atrophy as before. Electronically Signed   By: Zetta Bills M.D.   On: 10/26/2019 18:47   CT Lumbar Spine Wo Contrast  Result Date: 10/26/2019 CLINICAL DATA:  Low back pain after a recent fall. EXAM: CT LUMBAR SPINE WITHOUT CONTRAST TECHNIQUE: Multidetector CT imaging of the lumbar spine was performed without intravenous contrast administration. Multiplanar CT image reconstructions were also generated. COMPARISON:  Lumbar spine radiographs 10/26/2019 FINDINGS: Segmentation: 5 lumbar type vertebrae. Alignment: Mild lumbar dextroscoliosis. Minimal retrolisthesis of L2 on L3, L3 on L4, and L4 on L5. Vertebrae: No acute fracture or destructive osseous process. Subcentimeter sclerotic foci in the L1 and L2 vertebral bodies, possibly bone islands. Paraspinal and other soft tissues: Partially visualized right pleural effusion. Aortic atherosclerosis without aneurysm. Disc levels: Disc space narrowing is mild at L1-2, moderate at L4-5, and severe at L3-4 and L5-S1 with vacuum disc at each of these levels. T12-L1: Mild right facet arthrosis without evidence of stenosis. L1-2: Mild disc bulging without evidence of significant stenosis. L2-3: Circumferential disc bulging and mild-to-moderate facet and ligamentum flavum hypertrophy result in mild spinal stenosis and bilateral lateral recess stenosis without significant neural foraminal stenosis. L3-4: Circumferential disc bulging and moderate to severe facet and ligamentum flavum hypertrophy result in moderate spinal stenosis, bilateral lateral recess stenosis, and mild bilateral neural foraminal stenosis. L4-5: Circumferential disc bulging and moderate to severe facet and ligamentum flavum hypertrophy result in moderate spinal stenosis, bilateral lateral recess stenosis, and mild-to-moderate right greater than left neural foraminal stenosis. L5-S1: Disc bulging, endplate spurring, and moderate facet hypertrophy result in  moderate left greater than right neural foraminal stenosis without spinal stenosis. IMPRESSION: 1. No evidence of acute osseous abnormality. 2. Multilevel lumbar disc and facet degeneration with moderate spinal stenosis at L3-4 and L4-5. 3. Partially visualized right pleural effusion. 4. Aortic Atherosclerosis (ICD10-I70.0). Electronically Signed   By: Logan Bores M.D.   On: 10/26/2019 21:45   CT PELVIS WO CONTRAST  Result Date: 10/26/2019 CLINICAL DATA:  Pelvic trauma. Possible urinary tract infection. Severe low back pain. EXAM: CT PELVIS WITHOUT CONTRAST TECHNIQUE: Multidetector CT imaging of the pelvis was performed following the standard protocol without intravenous contrast. COMPARISON:  None. FINDINGS: Urinary Tract:  No abnormality visualized. Bowel:  Unremarkable visualized pelvic bowel loops. Vascular/Lymphatic: There are atherosclerotic changes of the visualized abdominal aorta. Reproductive:  No mass or other significant abnormality Other:  None. Musculoskeletal: No suspicious bone lesions identified. There is osteopenia. IMPRESSION: 1. No CT evidence for acute pelvic abnormality. 2. Atherosclerotic changes of the visualized abdominal aorta Aortic Atherosclerosis (ICD10-I70.0). Electronically Signed   By: Constance Holster M.D.   On: 10/26/2019 21:47   MR BRAIN WO CONTRAST  Result Date: 10/27/2019 CLINICAL DATA:  Initial evaluation for acute vertigo, dizziness, recent fall. EXAM: MRI HEAD WITHOUT CONTRAST TECHNIQUE: Multiplanar, multiecho pulse sequences of the brain and surrounding structures were obtained without intravenous contrast. COMPARISON:  Prior head CT from 10/26/2019. FINDINGS: Brain: Diffuse prominence of the CSF containing spaces compatible with generalized age-related cerebral atrophy. Mild patchy T2/FLAIR hyperintensity within the periventricular  white matter most consistent with chronic small vessel ischemic disease, mild for age. No abnormal foci of restricted diffusion to  suggest acute or subacute ischemia. Gray-white matter differentiation maintained. No encephalomalacia to suggest chronic cortical infarction. No foci of susceptibility artifact to suggest acute or chronic intracranial hemorrhage. No mass lesion, midline shift or mass effect. Diffuse ventriculomegaly, most likely related to global parenchymal volume loss, although a degree of NPH could possibly be contributory. Pituitary gland within normal limits. Midline structures intact. No extra-axial fluid collection. Vascular: Major intracranial vascular flow voids are maintained. Skull and upper cervical spine: Craniocervical junction within normal limits. Bone marrow signal intensity normal. Edema at the left parietal scalp consistent with contusion. Sinuses/Orbits: Patient status post ocular lens replacement on the left. Globes orbital soft tissues otherwise unremarkable. Paranasal sinuses are largely clear. Small left mastoid effusion noted, of doubtful significance. Inner ear structures grossly normal. Other: None. IMPRESSION: 1. No acute intracranial abnormality. 2. Left parietal scalp contusion, likely related to history of recent fall. 3. Age-related cerebral atrophy with mild chronic small vessel ischemic disease. 4. Diffuse ventriculomegaly, most likely related to underlying global parenchymal volume loss. A degree of NPH could be contributory, and could be considered in the correct clinical setting. Electronically Signed   By: Rise Mu M.D.   On: 10/27/2019 02:56   ECHOCARDIOGRAM COMPLETE  Result Date: 10/28/2019   ECHOCARDIOGRAM REPORT   Patient Name:   Hazle Antonelli Date of Exam: 10/27/2019 Medical Rec #:  [id number]  Height:       63.0 in Accession #:    [id number] Weight:       192.0 lb Date of Birth:  [birth year]  BSA:          1.90 m Patient Age:    83 years   BP:           170/80 mmHg Patient Gender: F          HR:           89 bpm. Exam Location:  [facility] Procedure: 2D Echo Indications:     ELEVATED  TROPONIN                  ATRIAL FIBRILLATION 427.31/I48.91  History:         Patient has no prior history of Echocardiogram examinations.                  Risk Factors:Hypertension and Former Smoker.  Sonographer:     Johnathan Hausen Referring Phys:  [id number] Vernetta Honey MANSY Diagnosing Phys: Arnoldo Hooker MD IMPRESSIONS  1. Left ventricular ejection fraction, by visual estimation, is 60 to 65%. The left ventricle has normal function. There is no left ventricular hypertrophy.  2. The left ventricle has no regional wall motion abnormalities.  3. Global right ventricle has normal systolic function.The right ventricular size is normal. No increase in right ventricular wall thickness.  4. Left atrial size was mildly dilated.  5. Right atrial size was mildly dilated.  6. The mitral valve is normal in structure. Mild mitral valve regurgitation.  7. The tricuspid valve is normal in structure. Tricuspid valve regurgitation is mild.  8. The aortic valve is normal in structure. Aortic valve regurgitation is trivial.  9. The pulmonic valve was normal in structure. Pulmonic valve regurgitation is not visualized. 10. Moderately elevated pulmonary artery systolic pressure. FINDINGS  Left Ventricle: Left ventricular ejection fraction, by visual estimation, is 60 to 65%. The left ventricle has normal function. The  left ventricle has no regional wall motion abnormalities. There is no left ventricular hypertrophy. Right Ventricle: The right ventricular size is normal. No increase in right ventricular wall thickness. Global RV systolic function is has normal systolic function. The tricuspid regurgitant velocity is 3.20 m/s, and with an assumed right atrial pressure  of 10 mmHg, the estimated right ventricular systolic pressure is moderately elevated at 51.0 mmHg. Left Atrium: Left atrial size was mildly dilated. Right Atrium: Right atrial size was mildly dilated Pericardium: There is no evidence of pericardial effusion. Mitral Valve: The  mitral valve is normal in structure. Mild mitral valve regurgitation. Tricuspid Valve: The tricuspid valve is normal in structure. Tricuspid valve regurgitation is mild. Aortic Valve: The aortic valve is normal in structure. Aortic valve regurgitation is trivial. Pulmonic Valve: The pulmonic valve was normal in structure. Pulmonic valve regurgitation is not visualized. Pulmonic regurgitation is not visualized. Aorta: The aortic root, ascending aorta and aortic arch are all structurally normal, with no evidence of dilitation or obstruction. IAS/Shunts: No atrial level shunt detected by color flow Doppler.  LEFT VENTRICLE PLAX 2D LVIDd:         3.92 cm  Diastology LVIDs:         2.32 cm  LV e' lateral:   8.05 cm/s LV PW:         0.75 cm  LV E/e' lateral: 11.3 LV IVS:        0.71 cm  LV e' medial:    8.27 cm/s LVOT diam:     1.60 cm  LV E/e' medial:  11.0 LV SV:         48 ml LV SV Index:   24.04 LVOT Area:     2.01 cm  RIGHT VENTRICLE             IVC RV S prime:     15.20 cm/s  IVC diam: 2.14 cm TAPSE (M-mode): 2.5 cm LEFT ATRIUM             Index       RIGHT ATRIUM           Index LA diam:        4.00 cm 2.10 cm/m  RA Area:     17.40 cm LA Vol (A2C):   64.0 ml 33.68 ml/m RA Volume:   46.80 ml  24.63 ml/m LA Vol (A4C):   83.4 ml 43.88 ml/m LA Biplane Vol: 75.6 ml 39.78 ml/m   AORTA Ao Root diam: 2.70 cm MITRAL VALVE                        TRICUSPID VALVE MV Area (PHT): 3.99 cm             TR Peak grad:   41.0 mmHg MV PHT:        55.10 msec           TR Vmax:        345.00 cm/s MV Decel Time: 190 msec MV E velocity: 91.20 cm/s 103 cm/s  SHUNTS MV A velocity: 66.50 cm/s 70.3 cm/s Systemic Diam: 1.60 cm MV E/A ratio:  1.37       1.5  Arnoldo Hooker MD Electronically signed by Arnoldo Hooker MD Signature Date/Time: 10/28/2019/12:59:09 PM    Final    (Echo, Carotid, EGD, Colonoscopy, ERCP)    Subjective: Seen and examined on the day of discharge No acute events No new complaints  Discharge Exam: Vitals:  10/30/19 0200 10/30/19 0750  BP: (!) 177/67 (!) 190/81  Pulse: 82 84  Resp: 16   Temp:  98 F (36.7 C)  SpO2: 97% 96%   Vitals:   10/29/19 1518 10/29/19 2303 10/30/19 0200 10/30/19 0750  BP: (!) 161/70 (!) 188/84 (!) 177/67 (!) 190/81  Pulse: 82 88 82 84  Resp: Temp: 98.3 F (36.8 C) 99 F (37.2 C)  98 F (36.7 C)  TempSrc:  Oral  Oral  SpO2: 98% 97% 97% 96%  Weight:      Height:        General: Pt is alert, awake, not in acute distress Cardiovascular: RRR, S1/S2 +, no rubs, no gallops Respiratory: CTA bilaterally, no wheezing, no rhonchi Abdominal: Soft, NT, ND, bowel sounds + Extremities: no edema, no cyanosis    The results of significant diagnostics from this hospitalization (including imaging, microbiology, ancillary and laboratory) are listed below for reference.     Microbiology: Recent Results (from the past 240 hour(s))  SARS CORONAVIRUS 2 (TAT 6-24 HRS) Nasopharyngeal Nasopharyngeal Swab     Status: None   Collection Time: 10/26/19  7:38 PM   Specimen: Nasopharyngeal Swab  Result Value Ref Range Status   SARS Coronavirus 2 NEGATIVE NEGATIVE Final    Comment: (NOTE) SARS-CoV-2 target nucleic acids are NOT DETECTED. The SARS-CoV-2 RNA is generally detectable in upper and lower respiratory specimens during the acute phase of infection. Negative results do not preclude SARS-CoV-2 infection, do not rule out co-infections with other pathogens, and should not be used as the sole basis for treatment or other patient management decisions. Negative results must be combined with clinical observations, patient history, and epidemiological information. The expected result is Negative. Fact Sheet for Patients: HairSlick.no Fact Sheet for Healthcare Providers: quierodirigir.com This test is not yet approved or cleared by the [place] FDA and  has been authorized for detection and/or diagnosis of  SARS-CoV-2 by FDA under an Emergency Use Authorization (EUA). This EUA will remain  in effect (meaning this test can be used) for the duration of the COVID-19 declaration under Section 56 4(b)(1) of the Act, 21 U.S.C. section 360bbb-3(b)(1), unless the authorization is terminated or revoked sooner. Performed at [facility], [address], [place], [place] [address]   SARS CORONAVIRUS 2 (TAT 6-24 HRS) Nasopharyngeal Nasopharyngeal Swab     Status: None   Collection Time: 10/29/19  6:04 PM   Specimen: Nasopharyngeal Swab  Result Value Ref Range Status   SARS Coronavirus 2 NEGATIVE NEGATIVE Final    Comment: (NOTE) SARS-CoV-2 target nucleic acids are NOT DETECTED. The SARS-CoV-2 RNA is generally detectable in upper and lower respiratory specimens during the acute phase of infection. Negative results do not preclude SARS-CoV-2 infection, do not rule out co-infections with other pathogens, and should not be used as the sole basis for treatment or other patient management decisions. Negative results must be combined with clinical observations, patient history, and epidemiological information. The expected result is Negative. Fact Sheet for Patients: HairSlick.no Fact Sheet for Healthcare Providers: quierodirigir.com This test is not yet approved or cleared by the [place] FDA and  has been authorized for detection and/or diagnosis of SARS-CoV-2 by FDA under an Emergency Use Authorization (EUA). This EUA will remain  in effect (meaning this test can be used) for the duration of the COVID-19 declaration under Section 56 4(b)(1) of the Act, 21 U.S.C. section 360bbb-3(b)(1), unless the authorization is terminated or revoked sooner. Performed  at [facility], [address] [place], [place] [address]      Labs: BNP (last 3 results) No results for input(s): BNP in the last 8760 hours. Basic Metabolic  Panel: Recent Labs  Lab 10/26/19 1809 10/26/19 1956 10/27/19 0440 10/30/19 0518  NA 136  --  138 141  K 3.4*  --  4.2 3.2*  CL 96*  --  101 104  CO2 25  --  27 27  GLUCOSE 95  --  68* 94  BUN 12  --  13 9  CREATININE 0.72  --  0.70 0.70  CALCIUM 9.4  --  8.6* 8.5*  MG  --  1.7  --   --    Liver Function Tests: Recent Labs  Lab 10/26/19 1809  AST 50*  ALT 21  ALKPHOS 62  BILITOT 1.9*  PROT 7.0  ALBUMIN 3.9   No results for input(s): LIPASE, AMYLASE in the last 168 hours. No results for input(s): AMMONIA in the last 168 hours. CBC: Recent Labs  Lab 10/26/19 1809 10/27/19 0440  WBC 12.8* 10.5  NEUTROABS 10.9*  --   HGB 12.8 10.7*  HCT 40.7 32.8*  MCV 91.9 89.9  PLT 362 278   Cardiac Enzymes: Recent Labs  Lab 10/26/19 1809 10/27/19 0440 10/28/19 0436 10/29/19 0522  CKTOTAL 920*  --   --   --   CKMB  --  18.9* 6.3* 3.5   BNP: Invalid input(s): POCBNP CBG: No results for input(s): GLUCAP in the last 168 hours. D-Dimer No results for input(s): DDIMER in the last 72 hours. Hgb A1c No results for input(s): HGBA1C in the last 72 hours. Lipid Profile No results for input(s): CHOL, HDL, LDLCALC, TRIG, CHOLHDL, LDLDIRECT in the last 72 hours. Thyroid function studies No results for input(s): TSH, T4TOTAL, T3FREE, THYROIDAB in the last 72 hours.  Invalid input(s): FREET3 Anemia work up No results for input(s): VITAMINB12, FOLATE, FERRITIN, TIBC, IRON, RETICCTPCT in the last 72 hours. Urinalysis    Component Value Date/Time   COLORURINE YELLOW (A) 10/26/2019 1809   APPEARANCEUR CLEAR (A) 10/26/2019 1809   LABSPEC 1.017 10/26/2019 1809   PHURINE 5.0 10/26/2019 1809   GLUCOSEU NEGATIVE 10/26/2019 1809   HGBUR MODERATE (A) 10/26/2019 1809   BILIRUBINUR NEGATIVE 10/26/2019 1809   KETONESUR 20 (A) 10/26/2019 1809   PROTEINUR 30 (A) 10/26/2019 1809   NITRITE NEGATIVE 10/26/2019 1809   LEUKOCYTESUR NEGATIVE 10/26/2019 1809   Sepsis Labs Invalid input(s):  PROCALCITONIN,  WBC,  LACTICIDVEN Microbiology Recent Results (from the past 240 hour(s))  SARS CORONAVIRUS 2 (TAT 6-24 HRS) Nasopharyngeal Nasopharyngeal Swab     Status: None   Collection Time: 10/26/19  7:38 PM   Specimen: Nasopharyngeal Swab  Result Value Ref Range Status   SARS Coronavirus 2 NEGATIVE NEGATIVE Final    Comment: (NOTE) SARS-CoV-2 target nucleic acids are NOT DETECTED. The SARS-CoV-2 RNA is generally detectable in upper and lower respiratory specimens during the acute phase of infection. Negative results do not preclude SARS-CoV-2 infection, do not rule out co-infections with other pathogens, and should not be used as the sole basis for treatment or other patient management decisions. Negative results must be combined with clinical observations, patient history, and epidemiological information. The expected result is Negative. Fact Sheet for Patients: HairSlick.no Fact Sheet for Healthcare Providers: quierodirigir.com This test is not yet approved or cleared by the [place] FDA and  has been authorized for detection and/or diagnosis of SARS-CoV-2 by FDA under  an Emergency Use Authorization (EUA). This EUA will remain  in effect (meaning this test can be used) for the duration of the COVID-19 declaration under Section 56 4(b)(1) of the Act, 21 U.S.C. section 360bbb-3(b)(1), unless the authorization is terminated or revoked sooner. Performed at [facility], [address], [place], [place] [address]   SARS CORONAVIRUS 2 (TAT 6-24 HRS) Nasopharyngeal Nasopharyngeal Swab     Status: None   Collection Time: 10/29/19  6:04 PM   Specimen: Nasopharyngeal Swab  Result Value Ref Range Status   SARS Coronavirus 2 NEGATIVE NEGATIVE Final    Comment: (NOTE) SARS-CoV-2 target nucleic acids are NOT DETECTED. The SARS-CoV-2 RNA is generally detectable in upper and lower respiratory specimens during the acute  phase of infection. Negative results do not preclude SARS-CoV-2 infection, do not rule out co-infections with other pathogens, and should not be used as the sole basis for treatment or other patient management decisions. Negative results must be combined with clinical observations, patient history, and epidemiological information. The expected result is Negative. Fact Sheet for Patients: HairSlick.no Fact Sheet for Healthcare Providers: quierodirigir.com This test is not yet approved or cleared by the [place] FDA and  has been authorized for detection and/or diagnosis of SARS-CoV-2 by FDA under an Emergency Use Authorization (EUA). This EUA will remain  in effect (meaning this test can be used) for the duration of the COVID-19 declaration under Section 56 4(b)(1) of the Act, 21 U.S.C. section 360bbb-3(b)(1), unless the authorization is terminated or revoked sooner. Performed at [facility], [address], [place], [place] [address]      Time coordinating discharge: Over 30 minutes  SIGNED:   Tresa Moore, MD  [facility] 10/30/2019, 1:17 PM Pager [phone]  If 7PM-7AM, please contact night-coverage [email or web address] Password [email or web address]

## 2019-10-30 NOTE — Plan of Care (Signed)

## 2019-12-25 ENCOUNTER — Observation Stay: Payer: Medicare HMO

## 2019-12-25 ENCOUNTER — Inpatient Hospital Stay
Admission: EM | Admit: 2019-12-25 | Discharge: 2020-01-01 | DRG: 641 | Disposition: A | Discharge status: Home / Self Care | Payer: Medicare HMO | Attending: Internal Medicine | Admitting: Internal Medicine

## 2019-12-25 ENCOUNTER — Encounter: Payer: Self-pay | Admitting: Emergency Medicine

## 2019-12-25 ENCOUNTER — Other Ambulatory Visit: Payer: Self-pay

## 2019-12-25 ENCOUNTER — Emergency Department: Payer: Medicare HMO

## 2019-12-25 DIAGNOSIS — R001 Bradycardia, unspecified: Secondary | ICD-10-CM | POA: Diagnosis not present

## 2019-12-25 DIAGNOSIS — K219 Gastro-esophageal reflux disease without esophagitis: Secondary | ICD-10-CM | POA: Diagnosis not present

## 2019-12-25 DIAGNOSIS — R531 Weakness: Secondary | ICD-10-CM

## 2019-12-25 DIAGNOSIS — K222 Esophageal obstruction: Secondary | ICD-10-CM | POA: Diagnosis present

## 2019-12-25 DIAGNOSIS — E860 Dehydration: Principal | ICD-10-CM | POA: Diagnosis present

## 2019-12-25 DIAGNOSIS — N189 Chronic kidney disease, unspecified: Secondary | ICD-10-CM | POA: Diagnosis present

## 2019-12-25 DIAGNOSIS — Z882 Allergy status to sulfonamides status: Secondary | ICD-10-CM

## 2019-12-25 DIAGNOSIS — E876 Hypokalemia: Secondary | ICD-10-CM | POA: Diagnosis present

## 2019-12-25 DIAGNOSIS — I10 Essential (primary) hypertension: Secondary | ICD-10-CM | POA: Diagnosis present

## 2019-12-25 DIAGNOSIS — Z87891 Personal history of nicotine dependence: Secondary | ICD-10-CM

## 2019-12-25 DIAGNOSIS — N179 Acute kidney failure, unspecified: Secondary | ICD-10-CM | POA: Diagnosis not present

## 2019-12-25 DIAGNOSIS — W19XXXA Unspecified fall, initial encounter: Secondary | ICD-10-CM | POA: Diagnosis present

## 2019-12-25 DIAGNOSIS — R197 Diarrhea, unspecified: Secondary | ICD-10-CM

## 2019-12-25 DIAGNOSIS — Z8249 Family history of ischemic heart disease and other diseases of the circulatory system: Secondary | ICD-10-CM

## 2019-12-25 DIAGNOSIS — R109 Unspecified abdominal pain: Secondary | ICD-10-CM | POA: Diagnosis present

## 2019-12-25 DIAGNOSIS — Z20822 Contact with and (suspected) exposure to covid-19: Secondary | ICD-10-CM | POA: Diagnosis present

## 2019-12-25 DIAGNOSIS — K2970 Gastritis, unspecified, without bleeding: Secondary | ICD-10-CM | POA: Diagnosis present

## 2019-12-25 DIAGNOSIS — I129 Hypertensive chronic kidney disease with stage 1 through stage 4 chronic kidney disease, or unspecified chronic kidney disease: Secondary | ICD-10-CM | POA: Diagnosis present

## 2019-12-25 DIAGNOSIS — Z888 Allergy status to other drugs, medicaments and biological substances status: Secondary | ICD-10-CM

## 2019-12-25 DIAGNOSIS — R1110 Vomiting, unspecified: Secondary | ICD-10-CM

## 2019-12-25 LAB — CBC WITH DIFFERENTIAL/PLATELET
Abs Immature Granulocytes: 0.18 10*3/uL — ABNORMAL HIGH (ref 0.00–0.07)
Basophils Absolute: 0 10*3/uL (ref 0.0–0.1)
Basophils Relative: 0 %
Eosinophils Absolute: 0 10*3/uL (ref 0.0–0.5)
Eosinophils Relative: 0 %
HCT: 46 % (ref 36.0–46.0)
Hemoglobin: 15.1 g/dL — ABNORMAL HIGH (ref 12.0–15.0)
Immature Granulocytes: 2 %
Lymphocytes Relative: 8 %
Lymphs Abs: 0.9 10*3/uL (ref 0.7–4.0)
MCH: 28.4 pg (ref 26.0–34.0)
MCHC: 32.8 g/dL (ref 30.0–36.0)
MCV: 86.5 fL (ref 80.0–100.0)
Monocytes Absolute: 0.5 10*3/uL (ref 0.1–1.0)
Monocytes Relative: 4 %
Neutro Abs: 9.3 10*3/uL — ABNORMAL HIGH (ref 1.7–7.7)
Neutrophils Relative %: 86 %
Platelets: 299 10*3/uL (ref 150–400)
RBC: 5.32 MIL/uL — ABNORMAL HIGH (ref 3.87–5.11)
RDW: 14.7 % (ref 11.5–15.5)
WBC: 10.9 10*3/uL — ABNORMAL HIGH (ref 4.0–10.5)
nRBC: 0.2 % (ref 0.0–0.2)

## 2019-12-25 LAB — COMPREHENSIVE METABOLIC PANEL
ALT: 13 U/L (ref 0–44)
AST: 23 U/L (ref 15–41)
Albumin: 4 g/dL (ref 3.5–5.0)
Alkaline Phosphatase: 60 U/L (ref 38–126)
Anion gap: 22 — ABNORMAL HIGH (ref 5–15)
BUN: 29 mg/dL — ABNORMAL HIGH (ref 8–23)
CO2: 25 mmol/L (ref 22–32)
Calcium: 10 mg/dL (ref 8.9–10.3)
Chloride: 93 mmol/L — ABNORMAL LOW (ref 98–111)
Creatinine, Ser: 1.17 mg/dL — ABNORMAL HIGH (ref 0.44–1.00)
GFR calc Af Amer: 51 mL/min — ABNORMAL LOW (ref >60)
GFR calc non Af Amer: 44 mL/min — ABNORMAL LOW (ref >60)
Glucose, Bld: 112 mg/dL — ABNORMAL HIGH (ref 70–99)
Potassium: 3 mmol/L — ABNORMAL LOW (ref 3.5–5.1)
Sodium: 140 mmol/L (ref 135–145)
Total Bilirubin: 1.7 mg/dL — ABNORMAL HIGH (ref 0.3–1.2)
Total Protein: 8.1 g/dL (ref 6.5–8.1)

## 2019-12-25 LAB — RESPIRATORY PANEL BY RT PCR (FLU A&B, COVID)
Influenza A by PCR: NEGATIVE
Influenza B by PCR: NEGATIVE
SARS Coronavirus 2 by RT PCR: NEGATIVE

## 2019-12-25 LAB — MAGNESIUM: Magnesium: 1.7 mg/dL (ref 1.7–2.4)

## 2019-12-25 LAB — CK: Total CK: 43 U/L (ref 38–234)

## 2019-12-25 LAB — POC SARS CORONAVIRUS 2 AG: SARS Coronavirus 2 Ag: NEGATIVE

## 2019-12-25 LAB — LIPASE, BLOOD: Lipase: 34 U/L (ref 11–51)

## 2019-12-25 MED ORDER — OXYCODONE-ACETAMINOPHEN 5-325 MG PO TABS
1.0000 | ORAL_TABLET | ORAL | Status: DC | PRN
  Administered 2019-12-26 – 2019-12-28 (×6): 1 via ORAL
  Filled 2019-12-25 (×7): qty 1

## 2019-12-25 MED ORDER — IOHEXOL 300 MG/ML  SOLN
75.0000 mL | Freq: Once | INTRAMUSCULAR | Status: AC | PRN
  Administered 2019-12-25: 75 mL via INTRAVENOUS

## 2019-12-25 MED ORDER — POLYETHYLENE GLYCOL 3350 17 G PO PACK: 17.0000 g | PACK | Freq: Every day | ORAL | Status: DC | PRN

## 2019-12-25 MED ORDER — LATANOPROST 0.005 % OP SOLN
1.0000 [drp] | Freq: Every day | OPHTHALMIC | Status: DC
  Administered 2019-12-26 – 2019-12-31 (×6): 1 [drp] via OPHTHALMIC
  Filled 2019-12-25 (×2): qty 2.5

## 2019-12-25 MED ORDER — HEPARIN SODIUM (PORCINE) 5000 UNIT/ML IJ SOLN: 5000.0000 [IU] | Freq: Three times a day (TID) | INTRAMUSCULAR | Status: DC

## 2019-12-25 MED ORDER — HYDRALAZINE HCL 25 MG PO TABS: 25.0000 mg | ORAL_TABLET | Freq: Three times a day (TID) | ORAL | Status: DC | PRN

## 2019-12-25 MED ORDER — ACETAMINOPHEN 325 MG PO TABS: 650.0000 mg | ORAL_TABLET | Freq: Four times a day (QID) | ORAL | Status: DC | PRN

## 2019-12-25 MED ORDER — DORZOLAMIDE HCL-TIMOLOL MAL 2-0.5 % OP SOLN
1.0000 [drp] | Freq: Two times a day (BID) | OPHTHALMIC | Status: DC
  Administered 2019-12-26 – 2020-01-01 (×13): 1 [drp] via OPHTHALMIC
  Filled 2019-12-25: qty 10

## 2019-12-25 MED ORDER — ENOXAPARIN SODIUM 40 MG/0.4ML ~~LOC~~ SOLN
40.0000 mg | SUBCUTANEOUS | Status: DC
  Administered 2019-12-25 – 2019-12-31 (×7): 40 mg via SUBCUTANEOUS
  Filled 2019-12-25 (×6): qty 0.4

## 2019-12-25 MED ORDER — SODIUM CHLORIDE 0.9 % IV BOLUS
1000.0000 mL | Freq: Once | INTRAVENOUS | Status: AC
  Administered 2019-12-25: 15:00:00 1000 mL via INTRAVENOUS

## 2019-12-25 MED ORDER — MECLIZINE HCL 12.5 MG PO TABS
12.5000 mg | ORAL_TABLET | Freq: Three times a day (TID) | ORAL | Status: DC | PRN
  Filled 2019-12-25: qty 1

## 2019-12-25 MED ORDER — MAGNESIUM SULFATE IN D5W 1-5 GM/100ML-% IV SOLN
1.0000 g | Freq: Once | INTRAVENOUS | Status: AC
  Administered 2019-12-25: 1 g via INTRAVENOUS
  Filled 2019-12-25: qty 100

## 2019-12-25 MED ORDER — POTASSIUM CHLORIDE CRYS ER 20 MEQ PO TBCR
40.0000 meq | EXTENDED_RELEASE_TABLET | Freq: Once | ORAL | Status: AC
  Administered 2019-12-25: 17:00:00 40 meq via ORAL
  Filled 2019-12-25: qty 2

## 2019-12-25 MED ORDER — PANTOPRAZOLE SODIUM 40 MG PO TBEC
40.0000 mg | DELAYED_RELEASE_TABLET | Freq: Every day | ORAL | Status: DC
  Administered 2019-12-26 – 2020-01-01 (×7): 40 mg via ORAL
  Filled 2019-12-25 (×8): qty 1

## 2019-12-25 MED ORDER — SODIUM CHLORIDE 0.9 % IV SOLN: INTRAVENOUS | Status: DC

## 2019-12-25 MED ORDER — METOPROLOL TARTRATE 25 MG PO TABS
25.0000 mg | ORAL_TABLET | Freq: Two times a day (BID) | ORAL | Status: DC
  Administered 2019-12-25 – 2019-12-26 (×2): 25 mg via ORAL
  Filled 2019-12-25: qty 1

## 2019-12-25 MED ORDER — SODIUM CHLORIDE 0.9 % IV BOLUS
1000.0000 mL | Freq: Once | INTRAVENOUS | Status: AC
  Administered 2019-12-25: 1000 mL via INTRAVENOUS

## 2019-12-25 MED ORDER — VITAMIN D 25 MCG (1000 UNIT) PO TABS
1000.0000 [IU] | ORAL_TABLET | Freq: Every day | ORAL | Status: DC
  Administered 2019-12-26 – 2020-01-01 (×7): 1000 [IU] via ORAL
  Filled 2019-12-25 (×7): qty 1

## 2019-12-25 NOTE — ED Notes (Signed)
Pt will be moved to cpod until admission bed available; report called to care nurse Candice RN 

## 2019-12-25 NOTE — H&P (Signed)
History and Physical    Kristen Richard ONG:[id number] DOB: 06-Aug-1936 DOA: 12/25/2019  Referring MD/NP/PA:   PCP: [facility]   Patient coming from:  The patient is coming from home.  At baseline, pt is partially dependent for most of ADL.        Chief Complaint: Fall, right hip pain, abdominal pain, nausea, vomiting, generalized weakness  HPI: Kristen Richard is a 84 y.o. female with medical history significant of hypertension, GERD, vertigo, fall, who presents with fall, right hip pain, nausea, vomiting, diarrhea.  Pt patient states that she has been having nausea, vomiting, abdominal pain in the past several days.  She has nonbiliary nonbloody vomitus, 2-3 times each day.  She has mild central abdominal pain.  She states that she had diarrhea few days ago, which had resolved.  Today she feels like constipated.  She has generalized weakness, poor oral intake.  She fell accidentally on Sunday, injured her right heel. No LOC. No head or neck pain. She has moderate and sharp right hip pain.  No facial droop or slurred speech.  No numbness in legs.  No symptoms of UTI.  Patient does not have chest pain, cough, shortness breath, fever or chills.  ED Course: pt was found to have WBC 10.9, lipase 34, Covid PCR negative, CK 43, AKI with creatinine 1.17, BUN 29, temperature 97.5, blood pressure 140/73, tachycardia, oxygen saturation 100% on room air.  CT of abdomen/pelvis is negative.  X-ray of her right hip/pelvis negative.  Patient is placed on MedSurg bed for observation.  Review of Systems:   General: no fevers, chills, no body weight gain, has poor appetite, has fatigue HEENT: no blurry vision, hearing changes or sore throat Respiratory: no dyspnea, coughing, wheezing CV: no chest pain, no palpitations GI: has nausea, vomiting, abdominal pain, diarrhea, constipation GU: no dysuria, burning on urination, increased urinary frequency, hematuria  Ext: no leg edema Neuro: no unilateral  weakness, numbness, or tingling, no vision change or hearing loss Skin: no rash, no skin tear. MSK: has right hip pain Heme: No easy bruising.  Travel history: No recent long distant travel.  Allergy:  Allergies  Allergen Reactions  . Sulfa Antibiotics Swelling    Pt was told it caused her brain to swell - unc documented in 2014, pt is unaware of this   . Acetazolamide Nausea And Vomiting    Dizziness     Past Medical History:  Diagnosis Date  . Fall    RISK  . Hypertension   . Vertigo     Past Surgical History:  Procedure Laterality Date  . CATARACT EXTRACTION W/PHACO Left 12/28/2018   Procedure: CATARACT EXTRACTION PHACO AND INTRAOCULAR LENS PLACEMENT (IOC) LEFT;  Surgeon: Elliot Cousin, MD;  Location: [facility] ORS;  Service: Ophthalmology;  Laterality: Left;  [place]   01:35 CDE 15.57 Fluid pack lot # [id number]  . TUBAL LIGATION      Social History:  reports that she has quit smoking. She has quit using smokeless tobacco. No history on file for alcohol and drug.  Family History:  Family History  Problem Relation Age of Onset  . Heart attack Father      Prior to Admission medications   Medication Sig Start Date End Date Taking? Authorizing Provider  acetaminophen (TYLENOL) 325 MG tablet Take 325-650 mg by mouth every 6 (six) hours as needed for moderate pain or headache.     [provider]  Calcium Citrate-Vitamin D (CALCIUM + D PO) Take 1  tablet by mouth daily.    [provider]  diphenhydrAMINE (BENADRYL) 25 MG tablet Take 25 mg by mouth daily as needed for allergies.    [provider]  dorzolamide-timolol (COSOPT) 22.3-6.8 MG/ML ophthalmic solution Place 1 drop into both eyes 2 (two) times daily. 09/25/19   [provider]  lisinopril (PRINIVIL,ZESTRIL) 20 MG tablet Take 20 mg by mouth daily.    [provider]  lisinopril (ZESTRIL) 40 MG tablet Take 1 tablet (40 mg total) by mouth daily. 10/31/19 11/30/19  Sreenath, Sudheer B,  MD  LUMIGAN 0.01 % SOLN Place 1 drop into both eyes at bedtime. 09/12/19   [provider]  meclizine (ANTIVERT) 12.5 MG tablet Take 1 tablet (12.5 mg total) by mouth 3 (three) times daily as needed for dizziness (Or vertigo). 10/28/19   Tresa Moore, MD  metoprolol tartrate (LOPRESSOR) 25 MG tablet Take 25 mg by mouth 2 (two) times daily.    [provider]  omeprazole (PRILOSEC) 20 MG capsule Take 20 mg by mouth daily.    [provider]  Vitamin D, Ergocalciferol, (DRISDOL) 1.25 MG (50000 UT) CAPS capsule Take 50,000 Units by mouth once a week. 09/18/19   [provider]    Physical Exam: Vitals:   12/25/19 1230 12/25/19 1430 12/25/19 1715 12/25/19 1855  BP: 140/73 140/72  138/75  Pulse: 75 92 84 75  Resp:    17  Temp:      SpO2: 100% 100% 100% 100%  Weight:      Height:       General: Not in acute distress.  Dry mucosal membrane HEENT:       Eyes: PERRL, EOMI, no scleral icterus.       ENT: No discharge from the ears and nose, no pharynx injection, no tonsillar enlargement.        Neck: No JVD, no bruit, no mass felt. Heme: No neck lymph node enlargement. Cardiac: S1/S2, RRR, No murmurs, No gallops or rubs. Respiratory: No rales, wheezing, rhonchi or rubs. GI: Soft, nondistended, nontender, no rebound pain, no organomegaly, BS present. GU: No hematuria Ext: No pitting leg edema bilaterally. 2+DP/PT pulse bilaterally. Musculoskeletal: Has tenderness in the right hip Skin: No rashes.  Neuro: Alert, oriented X3, cranial nerves II-XII grossly intact, moves all extremities. Psych: Patient is not psychotic, no suicidal or hemocidal ideation.  Labs on Admission: I have personally reviewed following labs and imaging studies  CBC: Recent Labs  Lab 12/25/19 0959  WBC 10.9*  NEUTROABS 9.3*  HGB 15.1*  HCT 46.0  MCV 86.5  PLT 299   Basic Metabolic Panel: Recent Labs  Lab 12/25/19 0959  NA 140  K 3.0*  CL 93*  CO2 25  GLUCOSE  112*  BUN 29*  CREATININE 1.17*  CALCIUM 10.0  MG 1.7   GFR: Estimated Creatinine Clearance: 39.5 mL/min (A) (by C-G formula based on SCr of 1.17 mg/dL (H)). Liver Function Tests: Recent Labs  Lab 12/25/19 0959  AST 23  ALT 13  ALKPHOS 60  BILITOT 1.7*  PROT 8.1  ALBUMIN 4.0   Recent Labs  Lab 12/25/19 0959  LIPASE 34   No results for input(s): AMMONIA in the last 168 hours. Coagulation Profile: No results for input(s): INR, PROTIME in the last 168 hours. Cardiac Enzymes: Recent Labs  Lab 12/25/19 0959  CKTOTAL 43   BNP (last 3 results) No results for input(s): PROBNP in the last 8760 hours. HbA1C: No results for input(s): HGBA1C in  the last 72 hours. CBG: No results for input(s): GLUCAP in the last 168 hours. Lipid Profile: No results for input(s): CHOL, HDL, LDLCALC, TRIG, CHOLHDL, LDLDIRECT in the last 72 hours. Thyroid Function Tests: No results for input(s): TSH, T4TOTAL, FREET4, T3FREE, THYROIDAB in the last 72 hours. Anemia Panel: No results for input(s): VITAMINB12, FOLATE, FERRITIN, TIBC, IRON, RETICCTPCT in the last 72 hours. Urine analysis:    Component Value Date/Time   COLORURINE YELLOW (A) 10/26/2019 1809   APPEARANCEUR CLEAR (A) 10/26/2019 1809   LABSPEC 1.017 10/26/2019 1809   PHURINE 5.0 10/26/2019 1809   GLUCOSEU NEGATIVE 10/26/2019 1809   HGBUR MODERATE (A) 10/26/2019 1809   BILIRUBINUR NEGATIVE 10/26/2019 1809   KETONESUR 20 (A) 10/26/2019 1809   PROTEINUR 30 (A) 10/26/2019 1809   NITRITE NEGATIVE 10/26/2019 1809   LEUKOCYTESUR NEGATIVE 10/26/2019 1809   Sepsis Labs: @LABRCNTIP (procalcitonin:4,lacticidven:4) ) Recent Results (from the past 240 hour(s))  Respiratory Panel by RT PCR (Flu A&B, Covid) - Nasopharyngeal Swab     Status: None   Collection Time: 12/25/19 11:10 AM   Specimen: Nasopharyngeal Swab  Result Value Ref Range Status   SARS Coronavirus 2 by RT PCR NEGATIVE NEGATIVE Final    Comment: (NOTE) SARS-CoV-2 target  nucleic acids are NOT DETECTED. The SARS-CoV-2 RNA is generally detectable in upper respiratoy specimens during the acute phase of infection. The lowest concentration of SARS-CoV-2 viral copies this assay can detect is 131 copies/mL. A negative result does not preclude SARS-Cov-2 infection and should not be used as the sole basis for treatment or other patient management decisions. A negative result may occur with  improper specimen collection/handling, submission of specimen other than nasopharyngeal swab, presence of viral mutation(s) within the areas targeted by this assay, and inadequate number of viral copies (<131 copies/mL). A negative result must be combined with clinical observations, patient history, and epidemiological information. The expected result is Negative. Fact Sheet for Patients:  PinkCheek.be Fact Sheet for Healthcare Providers:  GravelBags.it This test is not yet ap proved or cleared by the [place] FDA and  has been authorized for detection and/or diagnosis of SARS-CoV-2 by FDA under an Emergency Use Authorization (EUA). This EUA will remain  in effect (meaning this test can be used) for the duration of the COVID-19 declaration under Section 564(b)(1) of the Act, 21 U.S.C. section 360bbb-3(b)(1), unless the authorization is terminated or revoked sooner.    Influenza A by PCR NEGATIVE NEGATIVE Final   Influenza B by PCR NEGATIVE NEGATIVE Final    Comment: (NOTE) The Xpert Xpress SARS-CoV-2/FLU/RSV assay is intended as an aid in  the diagnosis of influenza from Nasopharyngeal swab specimens and  should not be used as a sole basis for treatment. Nasal washings and  aspirates are unacceptable for Xpert Xpress SARS-CoV-2/FLU/RSV  testing. Fact Sheet for Patients: PinkCheek.be Fact Sheet for Healthcare Providers: GravelBags.it This test is not  yet approved or cleared by the [place] FDA and  has been authorized for detection and/or diagnosis of SARS-CoV-2 by  FDA under an Emergency Use Authorization (EUA). This EUA will remain  in effect (meaning this test can be used) for the duration of the  Covid-19 declaration under Section 564(b)(1) of the Act, 21  U.S.C. section 360bbb-3(b)(1), unless the authorization is  terminated or revoked. Performed at [facility], [address] [place], [place] [address]      Radiological Exams on Admission: CT ABDOMEN PELVIS W CONTRAST  Result Date: 12/25/2019 CLINICAL DATA:  Abdominal pain.  Nausea. EXAM: CT ABDOMEN AND PELVIS WITH CONTRAST TECHNIQUE: Multidetector CT imaging of the abdomen and pelvis was performed using the standard protocol following bolus administration of intravenous contrast. CONTRAST:  61mL OMNIPAQUE IOHEXOL 300 MG/ML  SOLN COMPARISON:  CT scan of the pelvis dated 10/26/2019 FINDINGS: Lower chest: Small hiatal hernia. Otherwise normal. Hepatobiliary: Small focal area of fatty infiltration adjacent to the falciform ligament. Liver parenchyma is otherwise normal. Biliary tree is normal including the gallbladder. Pancreas: Unremarkable. No pancreatic ductal dilatation or surrounding inflammatory changes. Spleen: Normal in size without focal abnormality. Adrenals/Urinary Tract: Normal adrenal glands. 4 mm cyst in the mid left kidney. Kidneys are otherwise normal. No hydronephrosis. Bladder is empty. Stomach/Bowel: Tiny hiatal hernia. Stomach and small bowel appear normal including the terminal ileum. Tiny calcification at the tip of the cecum may represent a appendicoliths in a very small appendix. Vascular/Lymphatic: Aortic atherosclerosis. No enlarged abdominal or pelvic lymph nodes. Reproductive: Uterus and bilateral adnexa are unremarkable. Other: No abdominal wall hernia or abnormality. No abdominopelvic ascites. Musculoskeletal: No acute or significant osseous  findings. IMPRESSION: Benign-appearing abdomen and pelvis. Aortic Atherosclerosis (ICD10-I70.0). Electronically Signed   By: Francene Boyers M.D.   On: 12/25/2019 15:14   DG Abdomen Acute W/Chest  Result Date: 12/25/2019 CLINICAL DATA:  Vomiting and constipation. EXAM: DG ABDOMEN ACUTE W/ 1V CHEST COMPARISON:  Chest radiograph December 12, 2010 FINDINGS: PA chest: Lungs are clear. Heart size and pulmonary vascularity are normal. No adenopathy. Supine and upright abdomen: There is moderate stool in the colon. There is no bowel dilatation or air-fluid level to suggest bowel obstruction. No free air. There are parent phleboliths in the pelvis. There is mid lumbar dextroscoliosis with degenerative change in the lumbar spine. IMPRESSION: No bowel obstruction or free air. Moderate stool in colon. Lungs clear. Electronically Signed   By: Bretta Bang III M.D.   On: 12/25/2019 10:47   DG HIP UNILAT WITH PELVIS 2-3 VIEWS RIGHT  Result Date: 12/25/2019 CLINICAL DATA:  Unable to stand EXAM: DG HIP (WITH OR WITHOUT PELVIS) 2-3V RIGHT COMPARISON:  CT 12/25/2019 FINDINGS: Contrast material within the bladder. Pubic symphysis and rami appear intact. Limited evaluation of left femoral neck due to superimposition of the trochanter. No acute displaced fracture or malalignment on the right. IMPRESSION: 1. No acute osseous abnormality. 2. Contrast within the urinary bladder Electronically Signed   By: Jasmine Pang M.D.   On: 12/25/2019 18:13     EKG: Independently reviewed.  Sinus rhythm, QTC 559, early R wave progression, Q waves in lead III/aVF   Assessment/Plan Principal Problem:   AKI (acute kidney injury) (HCC) Active Problems:   Hypertension   Hypokalemia   GERD (gastroesophageal reflux disease)   Fall   Abdominal pain  AKI: Likely due to prerenal secondary to dehydration and continuation of ACEI -placed on med-surg bed for obs - IVF: 2L NS, then 75 cc/h - Follow up renal function by BMP - Avoid  using renal toxic medications, hypotension and contrast dye (or carefully use) - Hold lisinopril  Hypertension -hold lisinopril due to AKI -Continue metoprolol -IV hydralazine  Hypokalemia: K= 3.0  on admission. - Repleted - Check Mg level  GERD (gastroesophageal reflux disease): -protonix  Fall: X-ray of right hip is negative. -f/u CT-head -pt/ot -per Percocet for right Hip pain  Abdominal pain: CT abdomen/pelvis negative.  Lipase normal 34. -Supportive care -As needed percocet       DVT ppx: SQ Lovenox Code Status: Full code Family Communication:  Yes,  patient's daughter by phone  Disposition Plan:  Anticipate discharge back to previous home environment Consults called: None Admission status: Med-surg bed for obs   Date of Service 12/25/2019    Lorretta Harp [facility]   If 7PM-7AM, please contact night-coverage [email or web address] 12/25/2019, 7:14 PM

## 2019-12-25 NOTE — ED Notes (Signed)
Patient denies pain and is resting comfortably.  

## 2019-12-25 NOTE — ED Notes (Signed)
Pt to CT

## 2019-12-25 NOTE — ED Triage Notes (Addendum)
ARrives from home via [facility]>  C/O weakness x 2 weeks.  Per EMS report, patient has been unable to ambulated due to weakness.  Patient's granddaughter, who lives in the home, is COVID +.    Also c/o intermittent emesis and loss of appetitie.  Patient is AAOx3.  Skin warm and dry.  NAD

## 2019-12-25 NOTE — ED Provider Notes (Signed)
[facility] Emergency Department Provider Note  ____________________________________________  Time seen: Approximately 11:09 AM  I have reviewed the triage vital signs and the nursing notes.   HISTORY  Chief Complaint Weakness    HPI Kristen Richard is a 84 y.o. female with a past history of CKD, hypertension, GERD who comes the ED complaining of generalized weakness, severe fatigue unable to stand or walk.  She gets short of breath when she tries to walk as well but also feels like she cannot take a single step today.  She reports that EMS had to pick her up and carry her to the stretcher.  She also notes that her sense of smell has been off for the past 2 weeks and that everything smells like "a toilet," and she has not been eating.  Also reports her last bowel movement was 2 weeks ago for the same reason.  Denies headache vision changes paresthesias or weakness, denies chest pain.   Reportedly patient has a household member who is Covid positive as well.     Past Medical History:  Diagnosis Date  . Fall    RISK  . Hypertension   . Vertigo      Patient Active Problem List   Diagnosis Date Noted  . AKI (acute kidney injury) (HCC) 12/25/2019  . Hypertension   . Hypokalemia   . GERD (gastroesophageal reflux disease)   . Pressure injury of skin 10/27/2019  . Rhabdomyolysis 10/26/2019     Past Surgical History:  Procedure Laterality Date  . CATARACT EXTRACTION W/PHACO Left 12/28/2018   Procedure: CATARACT EXTRACTION PHACO AND INTRAOCULAR LENS PLACEMENT (IOC) LEFT;  Surgeon: Elliot Cousin, MD;  Location: [facility] ORS;  Service: Ophthalmology;  Laterality: Left;  [place]   01:35 CDE 15.57 Fluid pack lot # [id number]  . TUBAL LIGATION       Prior to Admission medications   Medication Sig Start Date End Date Taking? Authorizing Provider  acetaminophen (TYLENOL) 325 MG tablet Take 325-650 mg by mouth every 6 (six) hours as needed for moderate pain or headache.      [provider]  Calcium Citrate-Vitamin D (CALCIUM + D PO) Take 1 tablet by mouth daily.    [provider]  diphenhydrAMINE (BENADRYL) 25 MG tablet Take 25 mg by mouth daily as needed for allergies.    [provider]  dorzolamide-timolol (COSOPT) 22.3-6.8 MG/ML ophthalmic solution Place 1 drop into both eyes 2 (two) times daily. 09/25/19   [provider]  lisinopril (PRINIVIL,ZESTRIL) 20 MG tablet Take 20 mg by mouth daily.    [provider]  lisinopril (ZESTRIL) 40 MG tablet Take 1 tablet (40 mg total) by mouth daily. 10/31/19 11/30/19  Sreenath, Sudheer B, MD  LUMIGAN 0.01 % SOLN Place 1 drop into both eyes at bedtime. 09/12/19   [provider]  meclizine (ANTIVERT) 12.5 MG tablet Take 1 tablet (12.5 mg total) by mouth 3 (three) times daily as needed for dizziness (Or vertigo). 10/28/19   Tresa Moore, MD  metoprolol tartrate (LOPRESSOR) 25 MG tablet Take 25 mg by mouth 2 (two) times daily.    [provider]  omeprazole (PRILOSEC) 20 MG capsule Take 20 mg by mouth daily.    [provider]  Vitamin D, Ergocalciferol, (DRISDOL) 1.25 MG (50000 UT) CAPS capsule Take 50,000 Units by mouth once a week. 09/18/19   [provider]     Allergies Sulfa antibiotics and Acetazolamide   No family history on file.  Social History Social History   Tobacco Use  . Smoking status: Former Games developer  . Smokeless tobacco: Former Engineer, water Use Topics  . Alcohol use: Not on file  . Drug use: Not on file    Review of Systems  Constitutional:   No fever positive chills.  ENT:   No sore throat. No rhinorrhea. Cardiovascular:   No chest pain or syncope. Respiratory:   No dyspnea or cough. Gastrointestinal:   Negative for abdominal pain, vomiting and diarrhea.  Positive constipation Musculoskeletal:   Negative for focal pain or swelling All other systems reviewed and are negative except as documented above  in ROS and HPI.  ____________________________________________   PHYSICAL EXAM:  VITAL SIGNS: ED Triage Vitals  Enc Vitals Group     BP 12/25/19 0941 133/74     Pulse Rate 12/25/19 0941 (!) 102     Resp 12/25/19 0941 16     Temp 12/25/19 0941 (!) 97.5 F (36.4 C)     Temp src --      SpO2 12/25/19 0941 100 %     Weight 12/25/19 0939 192 lb 0.3 oz (87.1 kg)     Height 12/25/19 0939 5\' 3"  (1.6 m)     Head Circumference --      Peak Flow --      Pain Score 12/25/19 0939 0     Pain Loc --      Pain Edu? --      Excl. in GC? --     Vital signs reviewed, nursing assessments reviewed.   Constitutional:   Alert and oriented.  Ill-appearing Eyes:   Conjunctivae are normal. EOMI. PERRL. ENT      Head:   Normocephalic and atraumatic.      Nose:   Normal      Mouth/Throat:   Dry mucous membranes      Neck:   No meningismus. Full ROM. Hematological/Lymphatic/Immunilogical:   No cervical lymphadenopathy. Cardiovascular:   Tachycardia heart rate 105. Symmetric bilateral radial and DP pulses.  No murmurs. Cap refill less than 2 seconds. Respiratory:   Normal respiratory effort without tachypnea/retractions. Breath sounds are clear and equal bilaterally. No wheezes/rales/rhonchi. Gastrointestinal:   Soft and nontender. Non distended. There is no CVA tenderness.  No rebound, rigidity, or guarding.  No tympany.  Normal bowel sounds  Musculoskeletal:   Normal range of motion in all extremities. No joint effusions.  No lower extremity tenderness.  No edema. Neurologic:   Normal speech and language.  Motor grossly intact. No acute focal neurologic deficits are appreciated.  Skin:    Skin is warm, dry and intact. No rash noted.  No petechiae, purpura, or bullae.  ____________________________________________    LABS (pertinent positives/negatives) (all labs ordered are listed, but only abnormal results are displayed) Labs Reviewed  COMPREHENSIVE METABOLIC PANEL - Abnormal; Notable for  the following components:      Result Value   Potassium 3.0 (*)    Chloride 93 (*)    Glucose, Bld 112 (*)    BUN 29 (*)    Creatinine, Ser 1.17 (*)    Total Bilirubin 1.7 (*)    GFR calc non Af Amer 44 (*)    GFR calc Af Amer 51 (*)    Anion gap 22 (*)    All other components within normal limits  CBC WITH DIFFERENTIAL/PLATELET - Abnormal; Notable for the following components:   WBC 10.9 (*)    RBC 5.32 (*)    Hemoglobin  15.1 (*)    Neutro Abs 9.3 (*)    Abs Immature Granulocytes 0.18 (*)    All other components within normal limits  RESPIRATORY PANEL BY RT PCR (FLU A&B, COVID)  LIPASE, BLOOD  CK  MAGNESIUM  POC SARS CORONAVIRUS 2 AG -  ED  POC SARS CORONAVIRUS 2 AG   ____________________________________________   EKG  Sinus rhythm rate of 99, normal axis and intervals.  Normal QRS ST segments and T waves.  ____________________________________________    RADIOLOGY  DG Abdomen Acute W/Chest  Result Date: 12/25/2019 CLINICAL DATA:  Vomiting and constipation. EXAM: DG ABDOMEN ACUTE W/ 1V CHEST COMPARISON:  Chest radiograph December 12, 2010 FINDINGS: PA chest: Lungs are clear. Heart size and pulmonary vascularity are normal. No adenopathy. Supine and upright abdomen: There is moderate stool in the colon. There is no bowel dilatation or air-fluid level to suggest bowel obstruction. No free air. There are parent phleboliths in the pelvis. There is mid lumbar dextroscoliosis with degenerative change in the lumbar spine. IMPRESSION: No bowel obstruction or free air. Moderate stool in colon. Lungs clear. Electronically Signed   By: Lowella Grip III M.D.   On: 12/25/2019 10:47    ____________________________________________   PROCEDURES Procedures  ____________________________________________  DIFFERENTIAL DIAGNOSIS   COVID-19, dehydration, electrolyte abnormality, bowel obstruction, UTI, pneumonia  CLINICAL IMPRESSION / ASSESSMENT AND PLAN / ED  COURSE  Medications ordered in the ED: Medications  sodium chloride 0.9 % bolus 1,000 mL (has no administration in time range)  iohexol (OMNIPAQUE) 300 MG/ML solution 75 mL (has no administration in time range)  sodium chloride 0.9 % bolus 1,000 mL (1,000 mLs Intravenous New Bag/Given 12/25/19 1045)    Pertinent labs & imaging results that were available during my care of the patient were reviewed by me and considered in my medical decision making (see chart for details).  Kristen Richard was evaluated in Emergency Department on 12/25/2019 for the symptoms described in the history of present illness. She was evaluated in the context of the global COVID-19 pandemic, which necessitated consideration that the patient might be at risk for infection with the SARS-CoV-2 virus that causes COVID-19. Institutional protocols and algorithms that pertain to the evaluation of patients at risk for COVID-19 are in a state of rapid change based on information released by regulatory bodies including the CDC and federal and state organizations. These policies and algorithms were followed during the patient's care in the ED.   Patient presents with weakness and fatigue, Covid exposure.  Slight tachycardia which I think is due to dehydration.  I do not think the patient is septic.  Will check labs and give IV fluids, abdominal x-ray series including chest.  ----------------------------------------- 11:12 AM on 12/25/2019 -----------------------------------------  X-ray unremarkable, no signs of obstruction or pneumonia.   ----------------------------------------- 2:42 PM on 12/25/2019 -----------------------------------------  Symptoms not improved despite IV fluids.  I will continue hydration.  Labs do show AKI and hypokalemia.  Will obtain CT scan of the abdomen and pelvis given her multiple GI symptoms and nondiagnostic x-ray.  Discussed with hospitalist for further evaluation and management.  Covid PCR  negative.      ____________________________________________   FINAL CLINICAL IMPRESSION(S) / ED DIAGNOSES    Final diagnoses:  Generalized weakness  Hypokalemia  AKI (acute kidney injury) ([place])  Dehydration     ED Discharge Orders    None      Portions of this note were generated with dragon dictation software. Dictation errors may occur despite  best attempts at proofreading.   Sharman Cheek, MD 12/25/19 [phone]

## 2019-12-26 ENCOUNTER — Other Ambulatory Visit: Payer: Self-pay

## 2019-12-26 ENCOUNTER — Observation Stay: Payer: Medicare HMO

## 2019-12-26 DIAGNOSIS — Z8249 Family history of ischemic heart disease and other diseases of the circulatory system: Secondary | ICD-10-CM | POA: Diagnosis not present

## 2019-12-26 DIAGNOSIS — E876 Hypokalemia: Secondary | ICD-10-CM | POA: Diagnosis present

## 2019-12-26 DIAGNOSIS — K2900 Acute gastritis without bleeding: Secondary | ICD-10-CM | POA: Diagnosis not present

## 2019-12-26 DIAGNOSIS — I129 Hypertensive chronic kidney disease with stage 1 through stage 4 chronic kidney disease, or unspecified chronic kidney disease: Secondary | ICD-10-CM | POA: Diagnosis present

## 2019-12-26 DIAGNOSIS — Z20822 Contact with and (suspected) exposure to covid-19: Secondary | ICD-10-CM | POA: Diagnosis present

## 2019-12-26 DIAGNOSIS — Z882 Allergy status to sulfonamides status: Secondary | ICD-10-CM | POA: Diagnosis not present

## 2019-12-26 DIAGNOSIS — Z87891 Personal history of nicotine dependence: Secondary | ICD-10-CM | POA: Diagnosis not present

## 2019-12-26 DIAGNOSIS — R001 Bradycardia, unspecified: Secondary | ICD-10-CM | POA: Diagnosis not present

## 2019-12-26 DIAGNOSIS — R112 Nausea with vomiting, unspecified: Secondary | ICD-10-CM | POA: Diagnosis not present

## 2019-12-26 DIAGNOSIS — Z888 Allergy status to other drugs, medicaments and biological substances status: Secondary | ICD-10-CM | POA: Diagnosis not present

## 2019-12-26 DIAGNOSIS — K219 Gastro-esophageal reflux disease without esophagitis: Secondary | ICD-10-CM | POA: Diagnosis present

## 2019-12-26 DIAGNOSIS — K222 Esophageal obstruction: Secondary | ICD-10-CM | POA: Diagnosis present

## 2019-12-26 DIAGNOSIS — N179 Acute kidney failure, unspecified: Secondary | ICD-10-CM | POA: Diagnosis present

## 2019-12-26 DIAGNOSIS — K2970 Gastritis, unspecified, without bleeding: Secondary | ICD-10-CM | POA: Diagnosis present

## 2019-12-26 DIAGNOSIS — E860 Dehydration: Secondary | ICD-10-CM | POA: Diagnosis present

## 2019-12-26 DIAGNOSIS — N189 Chronic kidney disease, unspecified: Secondary | ICD-10-CM | POA: Diagnosis present

## 2019-12-26 LAB — BASIC METABOLIC PANEL
Anion gap: 14 (ref 5–15)
BUN: 19 mg/dL (ref 8–23)
CO2: 25 mmol/L (ref 22–32)
Calcium: 8.7 mg/dL — ABNORMAL LOW (ref 8.9–10.3)
Chloride: 102 mmol/L (ref 98–111)
Creatinine, Ser: 0.82 mg/dL (ref 0.44–1.00)
GFR calc Af Amer: >60 mL/min (ref >60)
GFR calc non Af Amer: >60 mL/min (ref >60)
Glucose, Bld: 85 mg/dL (ref 70–99)
Potassium: 2.8 mmol/L — ABNORMAL LOW (ref 3.5–5.1)
Sodium: 141 mmol/L (ref 135–145)

## 2019-12-26 LAB — CBC
HCT: 38.2 % (ref 36.0–46.0)
Hemoglobin: 12.6 g/dL (ref 12.0–15.0)
MCH: 28.6 pg (ref 26.0–34.0)
MCHC: 33 g/dL (ref 30.0–36.0)
MCV: 86.8 fL (ref 80.0–100.0)
Platelets: 208 10*3/uL (ref 150–400)
RBC: 4.4 MIL/uL (ref 3.87–5.11)
RDW: 14.5 % (ref 11.5–15.5)
WBC: 8.3 10*3/uL (ref 4.0–10.5)
nRBC: 0 % (ref 0.0–0.2)

## 2019-12-26 LAB — MAGNESIUM: Magnesium: 1.8 mg/dL (ref 1.7–2.4)

## 2019-12-26 LAB — GLUCOSE, CAPILLARY: Glucose-Capillary: 73 mg/dL (ref 70–99)

## 2019-12-26 LAB — POTASSIUM: Potassium: 3.6 mmol/L (ref 3.5–5.1)

## 2019-12-26 MED ORDER — MORPHINE SULFATE (PF) 2 MG/ML IV SOLN
1.0000 mg | INTRAVENOUS | Status: DC | PRN
  Administered 2019-12-26: 1 mg via INTRAVENOUS
  Filled 2019-12-26: qty 1

## 2019-12-26 MED ORDER — ONDANSETRON HCL 4 MG/2ML IJ SOLN
4.0000 mg | Freq: Four times a day (QID) | INTRAMUSCULAR | Status: DC | PRN
  Administered 2019-12-26 – 2019-12-28 (×3): 4 mg via INTRAVENOUS
  Filled 2019-12-26 (×3): qty 2

## 2019-12-26 MED ORDER — POTASSIUM CHLORIDE 10 MEQ/100ML IV SOLN
10.0000 meq | INTRAVENOUS | Status: AC
  Administered 2019-12-26 (×6): 10 meq via INTRAVENOUS
  Filled 2019-12-26 (×6): qty 100

## 2019-12-26 MED ORDER — MAGNESIUM SULFATE 2 GM/50ML IV SOLN
2.0000 g | Freq: Once | INTRAVENOUS | Status: AC
  Administered 2019-12-26: 2 g via INTRAVENOUS
  Filled 2019-12-26: qty 50

## 2019-12-26 NOTE — Progress Notes (Signed)
CCMD called to report pts HR sustaining in 20s-40s with some pauses on tele ( longest 4sec) / pt sitting in chair dry heaving / reports some dizziness/ no distress noted/ MD paged to make aware/ orders to d/c metoprolol/ orders to monitor closely/ possible cardio consult ordered if pauses continue/ defib pads placed at bedside if needed/ pt instructed to notify staff with any worsening dizziness/ call bell with reach/  pts HR has improved - HR 50s and she states her dizziness is better before staff left out of room.

## 2019-12-26 NOTE — Progress Notes (Signed)
Initial Nutrition Assessment  DOCUMENTATION CODES:   Not applicable  INTERVENTION:   RD will order supplements once diet advanced  Pt is at high refeed risk   NUTRITION DIAGNOSIS:   Inadequate oral intake related to acute illness as evidenced by per patient/family report.  GOAL:   Patient will meet greater than or equal to 90% of their needs  MONITOR:   PO intake, Supplement acceptance, Labs, Weight trends, Skin, I & O's  REASON FOR ASSESSMENT:   Consult Assessment of nutrition requirement/status  ASSESSMENT:   84 y.o. female with medical history significant of hypertension, GERD, vertigo, fall, who presents s/p fall with right hip pain, nausea, vomiting and diarrhea.  RD working remotely.  Per chart review, pt with poor appetite and oral intake for 2 weeks r/t loss of smell and weakness. Pt also reports nausea, vomiting and diarrhea for 3 days pta. Of note, pt lives with a COVID (+) person. Pt reports weakness and unable to stand. Pt has continued to have nausea and vomiting in hospital and is currently NPO. RD will add supplements once diet advanced. Pt's admit weight is significantly lower than her UBW; RD unsure if pt has lost weight or if this was a documentation error. RD unable to determine if pt has had any significant weight changes.   Medications reviewed and include: vitamin D3, lovenox, protonix, NaCl @75ml /hr  Labs reviewed: K 2.8(L), Mg 1.8 wnl  Unable to complete Nutrition-Focused physical exam at this time.   Diet Order:   Diet Order            Diet NPO time specified Except for: Ice Chips, Sips with Meds  Diet effective midnight             EDUCATION NEEDS:   No education needs have been identified at this time  Skin:  Skin Assessment: Reviewed RN Assessment(ecchymosis)  Last BM:  2 weeks pta per pt report  Height:   Ht Readings from Last 1 Encounters:  12/25/19 5\' 3"  (1.6 m)    Weight:   Wt Readings from Last 1 Encounters:   12/25/19 67.8 kg    Ideal Body Weight:  52.3 kg  BMI:  Body mass index is 26.48 kg/m.  Estimated Nutritional Needs:   Kcal:  1400-1600kcal/day  Protein:  70-80g/day  Fluid:  >1.3L/day  MS, RD, LDN Contact information available in Amion

## 2019-12-26 NOTE — Progress Notes (Signed)
CCMD called to report HR sustaining in the 40s, RN at beside to assess, pt sleeping, asymptomatic, easily aroused. MD aware. Will continue to monitor.

## 2019-12-26 NOTE — Evaluation (Signed)
Occupational Therapy Evaluation Patient Details Name: Kristen Richard MRN: [id number] DOB: 1936-08-28 Today's Date: 12/26/2019    History of Present Illness Pt is an 84 y.o. female presenting to hospital with generalized weakness, severe fatigue, unable to stand/walk; not eating; smell off past 2 weeks.  Pt admitted with AKI, htn, hypokalemia, abdominal pain, and h/o recent fall.  PMH includes CKD, htn, fall risk, vertigo.   Clinical Impression   Kristen Richard was seen for OT evaluation this date. Prior to hospital admission, pt was generally independent with BADL management. She endorses living at home with her great grand daughter and states she uses a RW for household distances. Her daughter at bedside indicates pt does not consistently use her RW in the home and states the pt has had at least 4 falls in the past 6 months. Currently pt demonstrates impairments as described below (See OT problem list) which functionally limit her ability to perform ADL/self-care tasks. Pt currently requires MIN A +2 for functional mobility as well as moderate assistance for LB ADL management. Pt is currently NPO 2/2 increased nausea and vomiting with food intake. She endorses she has lost a significant amount of weight in the past month and has been unable to keep food down or smell food since returning home from STR in January. Pt requires total assist for bed-level peri-care this date after an episode of bowel incontinence. Pt would benefit from skilled OT services to address noted impairments and functional limitations (see below for any additional details) in order to maximize safety and independence while minimizing falls risk and caregiver burden. Upon hospital discharge, recommend STR to maximize pt safety and return to PLOF.      Follow Up Recommendations  SNF;Other (comment)(If family declines recommend HHOT with 24/7 supervision for safety.)    Equipment Recommendations  None recommended by OT(Pt has necessary  equipment)    Recommendations for Other Services       Precautions / Restrictions Precautions Precautions: Fall Precaution Comments: NPO Restrictions Weight Bearing Restrictions: No      Mobility Bed Mobility Overal bed mobility: Needs Assistance Bed Mobility: Rolling;Supine to Sit Rolling: Min assist;Mod assist   Supine to sit: Min assist;Mod assist;+2 for physical assistance;HOB elevated     General bed mobility comments: logrolling L and R in bed to assist with clean-up d/t bowel incontinence and to change sheets; assist for trunk and B LE's semi-supine to sitting edge of bed (assist to scoot pt forward towards edge of bed)  Transfers Overall transfer level: Needs assistance Equipment used: Rolling walker (2 wheeled) Transfers: Sit to/from Stand Sit to Stand: Min assist;+2 physical assistance         General transfer comment: vc's to scoot to edge of bed and UE/LE placement; assist to initiate stand up to walker; vc's for lining up prior to sitting in recliner    Balance Overall balance assessment: Needs assistance Sitting-balance support: No upper extremity supported;Feet supported Sitting balance-Leahy Scale: Good Sitting balance - Comments: steady sitting reaching within BOS   Standing balance support: Bilateral upper extremity supported;During functional activity Standing balance-Leahy Scale: Poor Standing balance comment: pt requiring B UE support on RW for balance with taking steps bed to recliner                           ADL either performed or assessed with clinical judgement   ADL Overall ADL's : Needs assistance/impaired  General ADL Comments: Pt functionally limited by poor activity tolerance, generalized weakness, and pain this date. Requires +2 min A for functional transfers. Pt noted to have loose blood BM this date. Requires max/total assist for bed level peri-care/clean up. If  functionally able to make it to the bathroom suspect pt would require moderate assist for hygine management.  Moderate assist for LB ADL management in seated position.     Vision         Perception     Praxis      Pertinent Vitals/Pain Pain Assessment: Faces Faces Pain Scale: Hurts whole lot Pain Location: lower legs; back with touch and mobiltiy Pain Descriptors / Indicators: Tender;Discomfort;Sore;Grimacing;Guarding Pain Intervention(s): Limited activity within patient's tolerance;Monitored during session;Repositioned     Hand Dominance Right   Extremity/Trunk Assessment Upper Extremity Assessment Upper Extremity Assessment: Generalized weakness   Lower Extremity Assessment Lower Extremity Assessment: Generalized weakness       Communication Communication Communication: No difficulties   Cognition Arousal/Alertness: Awake/alert Behavior During Therapy: WFL for tasks assessed/performed Overall Cognitive Status: Within Functional Limits for tasks assessed                                 General Comments: some general confusion noted during PLOF/home situation questions(pt's daughter shaking her head no to some of pt's answers), but pt able to follow VCs generally consistently with occasional increased prompting to perform.   General Comments  Pea sized are of skin breakdwon noted on L buttocks (toward sacrum). ~Stage 1. RN notified.    Exercises Other Exercises Other Exercises: OT engages pt in functional hygine management after pt noted to be soiled with runny BM. Pt educated in bed mobility technique t/o and OT provides max/total assist for peri-care.   Shoulder Instructions      Home Living Family/patient expects to be discharged to:: Private residence Living Arrangements: Other (Comment) Available Help at Discharge: Family;Available 24 hours/day Type of Home: House Home Access: Ramped entrance     Home Layout: One level     Bathroom  Shower/Tub: Tub only   Firefighter: Standard Bathroom Accessibility: Yes How Accessible: Accessible via walker(Per pt dtr at bedside, BR is small but could fit walker.) Home Equipment: Walker - 2 wheels;Bedside commode;Cane - single point;Hospital bed          Prior Functioning/Environment Level of Independence: Independent with assistive device(s)        Comments: sponge bathes independently, dresses self independently, was driving prior to fall in december. Family drives her now. Taking medications on time, but can't keep them down; at least 4 falls in the past 6 months.        OT Problem List: Decreased strength;Pain;Increased edema;Decreased activity tolerance;Decreased safety awareness;Impaired balance (sitting and/or standing);Decreased knowledge of use of DME or AE      OT Treatment/Interventions:      OT Goals(Current goals can be found in the care plan section) Acute Rehab OT Goals Patient Stated Goal: to go home OT Goal Formulation: With patient Time For Goal Achievement: 01/09/20 Potential to Achieve Goals: Good ADL Goals Pt Will Perform Lower Body Dressing: sit to/from stand;with min assist;with adaptive equipment(With LRAD PRN for improved safety and functional independence.) Pt Will Transfer to Toilet: bedside commode;with supervision;with set-up;ambulating(With LRAD PRN for improved safety and functional independence.) Pt Will Perform Toileting - Clothing Manipulation and hygiene: with adaptive equipment;with min assist;sit to/from stand(With LRAD PRN for improved  safety and functional independence.)  OT Frequency:     Barriers to D/C:            Co-evaluation   Reason for Co-Treatment: Complexity of the patient's impairments (multi-system involvement) PT goals addressed during session: Mobility/safety with mobility;Balance;Proper use of DME OT goals addressed during session: ADL's and self-care;Proper use of Adaptive equipment and DME      AM-PAC  OT "6 Clicks" Daily Activity     Outcome Measure Help from another person eating meals?: A Little(Pt currently NPO) Help from another person taking care of personal grooming?: A Little Help from another person toileting, which includes using toliet, bedpan, or urinal?: A Lot Help from another person bathing (including washing, rinsing, drying)?: A Lot Help from another person to put on and taking off regular upper body clothing?: A Little Help from another person to put on and taking off regular lower body clothing?: A Lot 6 Click Score: 15   End of Session Equipment Utilized During Treatment: Gait belt;Rolling walker Nurse Communication: Mobility status;Other (comment)(Pt cleaned up aftet loose BM with potential red blood in stool. Pt noted with skin break down beginning on L buttocks.)  Activity Tolerance: Patient tolerated treatment well Patient left: in chair;with call bell/phone within reach;with chair alarm set;with family/visitor present  OT Visit Diagnosis: Other abnormalities of gait and mobility (R26.89);Repeated falls (R29.6);Pain Pain - Right/Left: (both) Pain - part of body: Leg;Ankle and joints of foot;Knee                Time: 3149-7026 OT Time Calculation (min): 43 min Charges:  OT General Charges $OT Visit: 1 Visit OT Evaluation $OT Eval Moderate Complexity: 1 Mod OT Treatments $Self Care/Home Management : 8-22 mins  Shara Blazing, M.S., OTR/L Ascom: [phone] 12/26/19, 4:30 PM

## 2019-12-26 NOTE — Progress Notes (Signed)
PROGRESS NOTE    Zyionna Pesce  MVE:[id number] DOB: [birth year] DOA: 12/25/2019 PCP: [facility]    Brief Narrative:  Kristen Richard is a 84 y.o. female with medical history significant of hypertension, GERD, vertigo, fall, who presents with fall, right hip pain, nausea, vomiting, diarrhea. pt was found to have WBC 10.9, lipase 34, Covid PCR negative, CK 43, AKI with creatinine 1.17, BUN 29, temperature 97.5, blood pressure 140/73, tachycardia, oxygen saturation 100% on room air.  CT of abdomen/pelvis is negative.  X-ray of her right hip/pelvis negative.  Patient is placed on MedSurg bed for observation.   Consultants:   none  Procedures: CT  Antimicrobials:   none   Subjective: Vomited her pills this am. Now HR while sitting sustaining 2240s with 3 to 3.5sec pauses. However pt is nauseous.  Patient also on metoprolol.  Patient had reported some dizziness.  Currently patient's heart rate improved to the 50s with improvement of her dizziness per nsg .  Objective: Vitals:   12/26/19 0601 12/26/19 0818 12/26/19 1130 12/26/19 1623  BP: (!) 141/75 (!) 147/68 106/64 (!) 116/53  Pulse: 91 96 (!) 53 (!) 49  Resp: 19 18 16    Temp: 97.6 F (36.4 C) 98 F (36.7 C) 98.4 F (36.9 C)   TempSrc: Oral Oral    SpO2: 100% 100% 99%   Weight:      Height:        Intake/Output Summary (Last 24 hours) at 12/26/2019 1635 Last data filed at 12/26/2019 0300 Gross per 24 hour  Intake 322.31 ml  Output 0 ml  Net 322.31 ml   Filed Weights   12/25/19 0939 12/25/19 2327  Weight: 87.1 kg 67.8 kg    Examination:  General exam: Appears calm and comfortable  Respiratory system: Clear to auscultation. Respiratory effort normal. Cardiovascular system: S1 & S2 heard, RRR. No JVD, murmurs, rubs, gallops or clicks.  Gastrointestinal system: Abdomen is nondistended, soft and nontender. Normal bowel sounds heard. Central nervous system: Alert and awake, grossly intact Extremities: No  edema Skin: Warm dry Psychiatry: . Mood & affect appropriate in current setting.     Data Reviewed: I have personally reviewed following labs and imaging studies  CBC: Recent Labs  Lab 12/25/19 0959 12/26/19 0452  WBC 10.9* 8.3  NEUTROABS 9.3*  --   HGB 15.1* 12.6  HCT 46.0 38.2  MCV 86.5 86.8  PLT 299 208   Basic Metabolic Panel: Recent Labs  Lab 12/25/19 0959 12/26/19 0452  NA 140 141  K 3.0* 2.8*  CL 93* 102  CO2 25 25  GLUCOSE 112* 85  BUN 29* 19  CREATININE 1.17* 0.82  CALCIUM 10.0 8.7*  MG 1.7 1.8   GFR: Estimated Creatinine Clearance: 49.8 mL/min (by C-G formula based on SCr of 0.82 mg/dL). Liver Function Tests: Recent Labs  Lab 12/25/19 0959  AST 23  ALT 13  ALKPHOS 60  BILITOT 1.7*  PROT 8.1  ALBUMIN 4.0   Recent Labs  Lab 12/25/19 0959  LIPASE 34   No results for input(s): AMMONIA in the last 168 hours. Coagulation Profile: No results for input(s): INR, PROTIME in the last 168 hours. Cardiac Enzymes: Recent Labs  Lab 12/25/19 0959  CKTOTAL 43   BNP (last 3 results) No results for input(s): PROBNP in the last 8760 hours. HbA1C: No results for input(s): HGBA1C in the last 72 hours. CBG: Recent Labs  Lab 12/26/19 0820  GLUCAP 73   Lipid Profile: No results for input(s):  CHOL, HDL, LDLCALC, TRIG, CHOLHDL, LDLDIRECT in the last 72 hours. Thyroid Function Tests: No results for input(s): TSH, T4TOTAL, FREET4, T3FREE, THYROIDAB in the last 72 hours. Anemia Panel: No results for input(s): VITAMINB12, FOLATE, FERRITIN, TIBC, IRON, RETICCTPCT in the last 72 hours. Sepsis Labs: No results for input(s): PROCALCITON, LATICACIDVEN in the last 168 hours.  Recent Results (from the past 240 hour(s))  Respiratory Panel by RT PCR (Flu A&B, Covid) - Nasopharyngeal Swab     Status: None   Collection Time: 12/25/19 11:10 AM   Specimen: Nasopharyngeal Swab  Result Value Ref Range Status   SARS Coronavirus 2 by RT PCR NEGATIVE NEGATIVE Final     Comment: (NOTE) SARS-CoV-2 target nucleic acids are NOT DETECTED. The SARS-CoV-2 RNA is generally detectable in upper respiratoy specimens during the acute phase of infection. The lowest concentration of SARS-CoV-2 viral copies this assay can detect is 131 copies/mL. A negative result does not preclude SARS-Cov-2 infection and should not be used as the sole basis for treatment or other patient management decisions. A negative result may occur with  improper specimen collection/handling, submission of specimen other than nasopharyngeal swab, presence of viral mutation(s) within the areas targeted by this assay, and inadequate number of viral copies (<131 copies/mL). A negative result must be combined with clinical observations, patient history, and epidemiological information. The expected result is Negative. Fact Sheet for Patients:  https://www.moore.com/ Fact Sheet for Healthcare Providers:  https://www.young.biz/ This test is not yet ap proved or cleared by the [place] FDA and  has been authorized for detection and/or diagnosis of SARS-CoV-2 by FDA under an Emergency Use Authorization (EUA). This EUA will remain  in effect (meaning this test can be used) for the duration of the COVID-19 declaration under Section 564(b)(1) of the Act, 21 U.S.C. section 360bbb-3(b)(1), unless the authorization is terminated or revoked sooner.    Influenza A by PCR NEGATIVE NEGATIVE Final   Influenza B by PCR NEGATIVE NEGATIVE Final    Comment: (NOTE) The Xpert Xpress SARS-CoV-2/FLU/RSV assay is intended as an aid in  the diagnosis of influenza from Nasopharyngeal swab specimens and  should not be used as a sole basis for treatment. Nasal washings and  aspirates are unacceptable for Xpert Xpress SARS-CoV-2/FLU/RSV  testing. Fact Sheet for Patients: https://www.moore.com/ Fact Sheet for Healthcare  Providers: https://www.young.biz/ This test is not yet approved or cleared by the [place] FDA and  has been authorized for detection and/or diagnosis of SARS-CoV-2 by  FDA under an Emergency Use Authorization (EUA). This EUA will remain  in effect (meaning this test can be used) for the duration of the  Covid-19 declaration under Section 564(b)(1) of the Act, 21  U.S.C. section 360bbb-3(b)(1), unless the authorization is  terminated or revoked. Performed at [facility], [address], [place], [place] [address]          Radiology Studies: CT HEAD WO CONTRAST  Result Date: 12/26/2019 CLINICAL DATA:  Posttraumatic headache. EXAM: CT HEAD WITHOUT CONTRAST TECHNIQUE: Contiguous axial images were obtained from the base of the skull through the vertex without intravenous contrast. COMPARISON:  Head CT 10/26/2019, brain MRI 10/27/2019 FINDINGS: Brain: No intracranial hemorrhage, mass effect, or midline shift. Atrophy and ventriculomegaly are stable. The basilar cisterns are patent. No evidence of territorial infarct or acute ischemia. No extra-axial or intracranial fluid collection. Vascular: Atherosclerosis of skullbase vasculature without hyperdense vessel or abnormal calcification. Skull: No fracture or focal lesion. Sinuses/Orbits: No acute findings. Chronic opacification of lower left  mastoid air cells. Prior left cataract resection. Other: None. IMPRESSION: 1. No acute intracranial abnormality. No skull fracture. 2. Stable atrophy. Stable ventriculomegaly likely related to central atrophy, normal pressure hydrocephalus could have a similar appearance. Electronically Signed   By: Keith Rake M.D.   On: 12/26/2019 06:54   CT ABDOMEN PELVIS W CONTRAST  Result Date: 12/25/2019 CLINICAL DATA:  Abdominal pain. Nausea. EXAM: CT ABDOMEN AND PELVIS WITH CONTRAST TECHNIQUE: Multidetector CT imaging of the abdomen and pelvis was performed using the standard  protocol following bolus administration of intravenous contrast. CONTRAST:  78mL OMNIPAQUE IOHEXOL 300 MG/ML  SOLN COMPARISON:  CT scan of the pelvis dated 10/26/2019 FINDINGS: Lower chest: Small hiatal hernia. Otherwise normal. Hepatobiliary: Small focal area of fatty infiltration adjacent to the falciform ligament. Liver parenchyma is otherwise normal. Biliary tree is normal including the gallbladder. Pancreas: Unremarkable. No pancreatic ductal dilatation or surrounding inflammatory changes. Spleen: Normal in size without focal abnormality. Adrenals/Urinary Tract: Normal adrenal glands. 4 mm cyst in the mid left kidney. Kidneys are otherwise normal. No hydronephrosis. Bladder is empty. Stomach/Bowel: Tiny hiatal hernia. Stomach and small bowel appear normal including the terminal ileum. Tiny calcification at the tip of the cecum may represent a appendicoliths in a very small appendix. Vascular/Lymphatic: Aortic atherosclerosis. No enlarged abdominal or pelvic lymph nodes. Reproductive: Uterus and bilateral adnexa are unremarkable. Other: No abdominal wall hernia or abnormality. No abdominopelvic ascites. Musculoskeletal: No acute or significant osseous findings. IMPRESSION: Benign-appearing abdomen and pelvis. Aortic Atherosclerosis (ICD10-I70.0). Electronically Signed   By: Lorriane Shire M.D.   On: 12/25/2019 15:14   DG Abdomen Acute W/Chest  Result Date: 12/25/2019 CLINICAL DATA:  Vomiting and constipation. EXAM: DG ABDOMEN ACUTE W/ 1V CHEST COMPARISON:  Chest radiograph December 12, 2010 FINDINGS: PA chest: Lungs are clear. Heart size and pulmonary vascularity are normal. No adenopathy. Supine and upright abdomen: There is moderate stool in the colon. There is no bowel dilatation or air-fluid level to suggest bowel obstruction. No free air. There are parent phleboliths in the pelvis. There is mid lumbar dextroscoliosis with degenerative change in the lumbar spine. IMPRESSION: No bowel obstruction or free  air. Moderate stool in colon. Lungs clear. Electronically Signed   By: Lowella Grip III M.D.   On: 12/25/2019 10:47   DG HIP UNILAT WITH PELVIS 2-3 VIEWS RIGHT  Result Date: 12/25/2019 CLINICAL DATA:  Unable to stand EXAM: DG HIP (WITH OR WITHOUT PELVIS) 2-3V RIGHT COMPARISON:  CT 12/25/2019 FINDINGS: Contrast material within the bladder. Pubic symphysis and rami appear intact. Limited evaluation of left femoral neck due to superimposition of the trochanter. No acute displaced fracture or malalignment on the right. IMPRESSION: 1. No acute osseous abnormality. 2. Contrast within the urinary bladder Electronically Signed   By: Donavan Foil M.D.   On: 12/25/2019 18:13        Scheduled Meds: . cholecalciferol  1,000 Units Oral Daily  . dorzolamide-timolol  1 drop Both Eyes BID  . enoxaparin (LOVENOX) injection  40 mg Subcutaneous Q24H  . latanoprost  1 drop Both Eyes QHS  . pantoprazole  40 mg Oral Daily   Continuous Infusions: . sodium chloride 75 mL/hr at 12/25/19 2242    Assessment & Plan:   Principal Problem:   AKI (acute kidney injury) ([place]) Active Problems:   Hypertension   Hypokalemia   GERD (gastroesophageal reflux disease)   Fall   Abdominal pain  AKI: Likely due to prerenal secondary to dehydration and continuation of ACEI Now improved  We will continue IV fluid - Avoid using renal toxic medications, hypotension and contrast dye (or carefully use) - Hold lisinopril  Hypertension -hold lisinopril due to AKI Will DC metoprolol as she is having bradycardia and pauses If need to will utilize IV hydralazine and initiate amlodipine in a.m. -IV hydralazine  Hypokalemia: K= 3.0  on admission. Repleted however potassium now is 2.8 Will aggressively replete and check post K Magnesium stable  Bradycardia -up to 3.5-second pause  On beta-blocker and nauseous and vomiting (vasovagal?) For now will monitor closely on tele. D/c metoprolol D/c any meds causing  decrease HR. If still with brady with pause, then may consider cardiology consult for SSS evaluation.  Vomiting /nausea CT abd no acute abn. Will consult GI, keep npo at [place] incase needs EGD.  GERD (gastroesophageal reflux disease): -protonix  Fall: X-ray of right hip is negative. -f/u CT-head -pt/ot-recommend SNF -per Percocet for right Hip pain  Abdominal pain: CT abdomen/pelvis negative.  Lipase normal 34. -Supportive care -As needed percocet    DVT prophylaxis: Lovenox Code Status: Full code Family Communication: None at bedside Disposition Plan: Needs snf when medically stable.  Currently will likely be here 1-2 more days as she is having bradycardia with pauses, nausea and vomiting and GI has been consulted       LOS: 0 days   Time spent: 45 minutes with more than 50% on COC    Lynn Ito, MD [facility] Pager [phone]  If 7PM-7AM, please contact night-coverage [email or web address] Password [email or web address] 12/26/2019, 4:35 PM

## 2019-12-26 NOTE — Evaluation (Signed)
Physical Therapy Evaluation Patient Details Name: Kristen Richard MRN: [id number] DOB: July 11, 1936 Today's Date: 12/26/2019   History of Present Illness  Pt is an 84 y.o. female presenting to hospital with generalized weakness, severe fatigue, unable to stand/walk; not eating; smell off past 2 weeks.  Pt admitted with AKI, htn, hypokalemia, abdominal pain, and h/o recent fall.  PMH includes CKD, htn, fall risk, vertigo.  Clinical Impression  PT/OT co-evaluation performed.  Prior to hospital admission, pt was ambulatory and lives with her 18 y.o. granddaughter.  Pt noted to be incontinent in bed beginning of session requiring assist for clean up.  Currently pt is min to mod assist for logrolling in bed; 2 assist semi-supine to sitting edge of bed; min assist x2 to stand up to walker; and CGA x2 to walk a few feet bed to recliner with RW (extra time required to take steps; generalized weakness noted).  Pt would benefit from skilled PT to address noted impairments and functional limitations (see below for any additional details).  Upon hospital discharge, pt would benefit from STR (pt's daughter verbalizing concerns regarding last rehab stay though).  If family chooses to take pt home, recommend 24/7 assist and pt may benefit from manual w/c d/t generalized weakness and decreased activity tolerance.    Follow Up Recommendations SNF    Equipment Recommendations  Rolling walker with 5" wheels;3in1 (PT);Wheelchair (measurements PT);Wheelchair cushion (measurements PT)    Recommendations for Other Services OT consult     Precautions / Restrictions Precautions Precautions: Fall Precaution Comments: NPO Restrictions Weight Bearing Restrictions: No      Mobility  Bed Mobility Overal bed mobility: Needs Assistance Bed Mobility: Rolling;Supine to Sit Rolling: Min assist;Mod assist   Supine to sit: Min assist;Mod assist;+2 for physical assistance;HOB elevated     General bed mobility comments:  logrolling L and R in bed to assist with clean-up d/t bowel incontinence and to change sheets; assist for trunk and B LE's semi-supine to sitting edge of bed (assist to scoot pt forward towards edge of bed)  Transfers Overall transfer level: Needs assistance Equipment used: Rolling walker (2 wheeled) Transfers: Sit to/from Stand Sit to Stand: Min assist;+2 physical assistance         General transfer comment: vc's to scoot to edge of bed and UE/LE placement; assist to initiate stand up to walker; vc's for lining up prior to sitting in recliner  Ambulation/Gait Ambulation/Gait assistance: Min guard;+2 physical assistance Gait Distance (Feet): 3 Feet(bed to recliner) Assistive device: Rolling walker (2 wheeled)   Gait velocity: decreased   General Gait Details: increased time to take steps; decreased B LE step length/foot clearance; steady with RW use  Stairs            Wheelchair Mobility    Modified Rankin (Stroke Patients Only)       Balance Overall balance assessment: Needs assistance Sitting-balance support: No upper extremity supported;Feet supported Sitting balance-Leahy Scale: Good Sitting balance - Comments: steady sitting reaching within BOS   Standing balance support: Bilateral upper extremity supported;During functional activity Standing balance-Leahy Scale: Poor Standing balance comment: pt requiring B UE support on RW for balance with taking steps bed to recliner                             Pertinent Vitals/Pain Pain Assessment: Faces Faces Pain Scale: Hurts a little bit(2/10 at rest; 8/10 with touch to lower legs) Pain Location: lower legs; back Pain  Descriptors / Indicators: Tender;Discomfort;Sore;Grimacing;Guarding Pain Intervention(s): Limited activity within patient's tolerance;Monitored during session;Repositioned  HR low 60's at rest to 88 bpm with activity; O2 sats WFL during session (on room air)    Home Living Family/patient  expects to be discharged to:: Private residence Living Arrangements: Other (Comment)(19 y.o. granddaughter) Available Help at Discharge: Family;Available 24 hours/day Type of Home: House Home Access: Ramped entrance     Home Layout: One level Home Equipment: Walker - 2 wheels;Bedside commode;Cane - single point;Hospital bed      Prior Function Level of Independence: Independent with assistive device(s)         Comments: sponge bathes independently, dresses self independently, was driving prior to fall in december. Family drives her now. Taking medications on time, but can't keep them down; at least 4 falls in the past 6 months.     Hand Dominance   Dominant Hand: Right    Extremity/Trunk Assessment   Upper Extremity Assessment Upper Extremity Assessment: Generalized weakness    Lower Extremity Assessment Lower Extremity Assessment: Generalized weakness       Communication   Communication: No difficulties  Cognition Arousal/Alertness: Awake/alert Behavior During Therapy: WFL for tasks assessed/performed                                   General Comments: some general confusion noted during PLOF/home situation questions(pt's daughter shaking her head no to some of pt's answers)      General Comments General comments (skin integrity, edema, etc.): skin breakdown noted L buttocks (towards sacrum) size of about pencil eraser (nurse notified).  Nursing cleared pt for participation in physical therapy.  Pt agreeable to PT session.  Pt's daughter present during session.    Exercises     Assessment/Plan    PT Assessment Patient needs continued PT services  PT Problem List Decreased strength;Decreased activity tolerance;Decreased balance;Decreased mobility;Decreased knowledge of use of DME;Decreased knowledge of precautions;Pain;Decreased skin integrity       PT Treatment Interventions DME instruction;Gait training;Functional mobility training;Therapeutic  activities;Therapeutic exercise;Balance training;Patient/family education    PT Goals (Current goals can be found in the Care Plan section)  Acute Rehab PT Goals Patient Stated Goal: to go home PT Goal Formulation: With patient Time For Goal Achievement: 01/09/20 Potential to Achieve Goals: Fair    Frequency Min 2X/week   Barriers to discharge        Co-evaluation PT/OT/SLP Co-Evaluation/Treatment: Yes Reason for Co-Treatment: For patient/therapist safety;To address functional/ADL transfers PT goals addressed during session: Mobility/safety with mobility;Balance;Proper use of DME OT goals addressed during session: ADL's and self-care;Proper use of Adaptive equipment and DME       AM-PAC PT "6 Clicks" Mobility  Outcome Measure Help needed turning from your back to your side while in a flat bed without using bedrails?: A Lot Help needed moving from lying on your back to sitting on the side of a flat bed without using bedrails?: Total Help needed moving to and from a bed to a chair (including a wheelchair)?: A Lot Help needed standing up from a chair using your arms (e.g., wheelchair or bedside chair)?: A Lot Help needed to walk in hospital room?: Total Help needed climbing 3-5 steps with a railing? : Total 6 Click Score: 9    End of Session Equipment Utilized During Treatment: Gait belt Activity Tolerance: Patient limited by fatigue Patient left: in chair;with call bell/phone within reach;with chair alarm set;with family/visitor  present;Other (comment)(B heels floating via pillow) Nurse Communication: Mobility status;Precautions(Some redness noted in stool during clean-up; skin breakdown buttock/sacral area) PT Visit Diagnosis: Other abnormalities of gait and mobility (R26.89);Muscle weakness (generalized) (M62.81);History of falling (Z91.81);Difficulty in walking, not elsewhere classified (R26.2)    Time: 2482-5003 PT Time Calculation (min) (ACUTE ONLY): 39 min   Charges:    PT Evaluation $PT Eval Low Complexity: 1 Low PT Treatments $Therapeutic Activity: 8-22 mins        Hendricks Limes, PT 12/26/19, 3:50 PM

## 2019-12-27 ENCOUNTER — Encounter: Payer: Self-pay | Admitting: Internal Medicine

## 2019-12-27 ENCOUNTER — Inpatient Hospital Stay: Payer: Medicare HMO | Admitting: Certified Registered"

## 2019-12-27 ENCOUNTER — Encounter
Admission: EM | Disposition: A | Discharge status: Home / Self Care | Payer: Self-pay | Source: Home / Self Care | Attending: Internal Medicine

## 2019-12-27 DIAGNOSIS — K2900 Acute gastritis without bleeding: Secondary | ICD-10-CM

## 2019-12-27 HISTORY — PX: ESOPHAGOGASTRODUODENOSCOPY (EGD) WITH PROPOFOL: SHX5813

## 2019-12-27 LAB — GLUCOSE, CAPILLARY: Glucose-Capillary: 74 mg/dL (ref 70–99)

## 2019-12-27 LAB — BASIC METABOLIC PANEL WITH GFR
Anion gap: 10 (ref 5–15)
BUN: 19 mg/dL (ref 8–23)
CO2: 26 mmol/L (ref 22–32)
Calcium: 8.3 mg/dL — ABNORMAL LOW (ref 8.9–10.3)
Chloride: 104 mmol/L (ref 98–111)
Creatinine, Ser: 0.86 mg/dL (ref 0.44–1.00)
GFR calc Af Amer: >60 mL/min
GFR calc non Af Amer: >60 mL/min
Glucose, Bld: 83 mg/dL (ref 70–99)
Potassium: 3.7 mmol/L (ref 3.5–5.1)
Sodium: 140 mmol/L (ref 135–145)

## 2019-12-27 SURGERY — ESOPHAGOGASTRODUODENOSCOPY (EGD) WITH PROPOFOL
Anesthesia: General

## 2019-12-27 MED ORDER — PROPOFOL 500 MG/50ML IV EMUL
INTRAVENOUS | Status: DC | PRN
  Administered 2019-12-27: 140 ug/kg/min via INTRAVENOUS

## 2019-12-27 MED ORDER — PROPOFOL 10 MG/ML IV BOLUS
INTRAVENOUS | Status: DC | PRN
  Administered 2019-12-27: 10 mg via INTRAVENOUS
  Administered 2019-12-27: 40 mg via INTRAVENOUS

## 2019-12-27 MED ORDER — GLYCOPYRROLATE 0.2 MG/ML IJ SOLN
INTRAMUSCULAR | Status: DC | PRN
  Administered 2019-12-27: .2 mg via INTRAVENOUS

## 2019-12-27 MED ORDER — LIDOCAINE HCL (CARDIAC) PF 100 MG/5ML IV SOSY
PREFILLED_SYRINGE | INTRAVENOUS | Status: DC | PRN
  Administered 2019-12-27: 100 mg via INTRATRACHEAL

## 2019-12-27 MED ORDER — SODIUM CHLORIDE 0.9 % IV SOLN: INTRAVENOUS | Status: DC

## 2019-12-27 NOTE — Anesthesia Preprocedure Evaluation (Signed)
Anesthesia Evaluation  Patient identified by MRN, date of birth, ID band Patient awake    Reviewed: Allergy & Precautions, NPO status , Patient's Chart, lab work & pertinent test results  History of Anesthesia Complications Negative for: history of anesthetic complications  Airway Mallampati: III  TM Distance: >3 FB Neck ROM: Full    Dental  (+) Upper Dentures, Lower Dentures   Pulmonary neg sleep apnea, neg COPD, former smoker,    breath sounds clear to auscultation- rhonchi (-) wheezing      Cardiovascular hypertension, Pt. on medications (-) CAD, (-) Past MI, (-) Cardiac Stents and (-) CABG  Rhythm:Regular Rate:Normal - Systolic murmurs and - Diastolic murmurs    Neuro/Psych neg Seizures negative neurological ROS  negative psych ROS   GI/Hepatic negative GI ROS, Neg liver ROS,   Endo/Other  negative endocrine ROSneg diabetes  Renal/GU negative Renal ROS     Musculoskeletal negative musculoskeletal ROS (+)   Abdominal (+) + obese,   Peds  Hematology negative hematology ROS (+)   Anesthesia Other Findings Past Medical History: No date: Fall     Comment:  RISK No date: Hypertension No date: Vertigo   Reproductive/Obstetrics                             Anesthesia Physical  Anesthesia Plan  ASA: II  Anesthesia Plan: General   Post-op Pain Management:    Induction: Intravenous  PONV Risk Score and Plan: 3 and Propofol infusion and TIVA  Airway Management Planned: Natural Airway and Nasal Cannula  Additional Equipment:   Intra-op Plan:   Post-operative Plan:   Informed Consent: I have reviewed the patients History and Physical, chart, labs and discussed the procedure including the risks, benefits and alternatives for the proposed anesthesia with the patient or authorized representative who has indicated his/her understanding and acceptance.       Plan Discussed with:  CRNA and Anesthesiologist  Anesthesia Plan Comments:         Anesthesia Quick Evaluation

## 2019-12-27 NOTE — Progress Notes (Signed)
Cancellation Note:  Pt currently off unit for procedure.  Will re-attempt PT treatment session at a later date/time.  Hendricks Limes, PT 12/27/19, 1:13 PM

## 2019-12-27 NOTE — Progress Notes (Signed)
PROGRESS NOTE    Kristen Richard  JOI:[id number] DOB: Apr 05, 1936 DOA: 12/25/2019 PCP: [facility]    Brief Narrative:  Kristen Richard is a 84 y.o. female with medical history significant of hypertension, GERD, vertigo, fall, who presents with fall, right hip pain, nausea, vomiting, diarrhea. pt was found to have WBC 10.9, lipase 34, Covid PCR negative, CK 43, AKI with creatinine 1.17, BUN 29, temperature 97.5, blood pressure 140/73, tachycardia, oxygen saturation 100% on room air.  CT of abdomen/pelvis is negative.  X-ray of her right hip/pelvis negative.  Patient is placed on MedSurg bed for observation.   Consultants:   none  Procedures: CT  Antimicrobials:   none   Subjective: Nausea or vomiting this AM.  She did say had dizziness yesterday but it was more with moving her head.  She does report having vertigo in the past. Objective: Vitals:   12/26/19 1703 12/26/19 1925 12/27/19 0502 12/27/19 0747  BP:  (!) 116/59 139/62 (!) 148/62  Pulse: (!) 53 (!) 56 64 74  Resp:  20 18 19   Temp:  (!) 97.5 F (36.4 C) 97.6 F (36.4 C) (!) 97.5 F (36.4 C)  TempSrc:  Oral Oral Oral  SpO2:  100% 100% 100%  Weight:      Height:        Intake/Output Summary (Last 24 hours) at 12/27/2019 0803 Last data filed at 12/27/2019 0600 Gross per 24 hour  Intake 1000 ml  Output 1300 ml  Net -300 ml   Filed Weights   12/25/19 0939 12/25/19 2327  Weight: 87.1 kg 67.8 kg    Examination:  General exam: Appears calm and comfortable  Respiratory system: Clear to auscultation. Respiratory effort normal. Cardiovascular system: S1 & S2 heard, RRR. No JVD, murmurs, rubs, gallops or clicks.  Gastrointestinal system: Abdomen is nondistended, soft and nontender. Normal bowel sounds heard. Central nervous system: Alert and awake, grossly intact Extremities: No edema Skin: Warm dry Psychiatry: . Mood & affect appropriate in current setting.     Data Reviewed: I have personally  reviewed following labs and imaging studies  CBC: Recent Labs  Lab 12/25/19 0959 12/26/19 0452  WBC 10.9* 8.3  NEUTROABS 9.3*  --   HGB 15.1* 12.6  HCT 46.0 38.2  MCV 86.5 86.8  PLT 299 470   Basic Metabolic Panel: Recent Labs  Lab 12/25/19 0959 12/26/19 0452 12/26/19 1802 12/27/19 0415  NA 140 141  --  140  K 3.0* 2.8* 3.6 3.7  CL 93* 102  --  104  CO2 25 25  --  26  GLUCOSE 112* 85  --  83  BUN 29* 19  --  19  CREATININE 1.17* 0.82  --  0.86  CALCIUM 10.0 8.7*  --  8.3*  MG 1.7 1.8  --   --    GFR: Estimated Creatinine Clearance: 47.5 mL/min (by C-G formula based on SCr of 0.86 mg/dL). Liver Function Tests: Recent Labs  Lab 12/25/19 0959  AST 23  ALT 13  ALKPHOS 60  BILITOT 1.7*  PROT 8.1  ALBUMIN 4.0   Recent Labs  Lab 12/25/19 0959  LIPASE 34   No results for input(s): AMMONIA in the last 168 hours. Coagulation Profile: No results for input(s): INR, PROTIME in the last 168 hours. Cardiac Enzymes: Recent Labs  Lab 12/25/19 0959  CKTOTAL 43   BNP (last 3 results) No results for input(s): PROBNP in the last 8760 hours. HbA1C: No results for input(s): HGBA1C in the  last 72 hours. CBG: Recent Labs  Lab 12/26/19 0820  GLUCAP 73   Lipid Profile: No results for input(s): CHOL, HDL, LDLCALC, TRIG, CHOLHDL, LDLDIRECT in the last 72 hours. Thyroid Function Tests: No results for input(s): TSH, T4TOTAL, FREET4, T3FREE, THYROIDAB in the last 72 hours. Anemia Panel: No results for input(s): VITAMINB12, FOLATE, FERRITIN, TIBC, IRON, RETICCTPCT in the last 72 hours. Sepsis Labs: No results for input(s): PROCALCITON, LATICACIDVEN in the last 168 hours.  Recent Results (from the past 240 hour(s))  Respiratory Panel by RT PCR (Flu A&B, Covid) - Nasopharyngeal Swab     Status: None   Collection Time: 12/25/19 11:10 AM   Specimen: Nasopharyngeal Swab  Result Value Ref Range Status   SARS Coronavirus 2 by RT PCR NEGATIVE NEGATIVE Final    Comment:  (NOTE) SARS-CoV-2 target nucleic acids are NOT DETECTED. The SARS-CoV-2 RNA is generally detectable in upper respiratoy specimens during the acute phase of infection. The lowest concentration of SARS-CoV-2 viral copies this assay can detect is 131 copies/mL. A negative result does not preclude SARS-Cov-2 infection and should not be used as the sole basis for treatment or other patient management decisions. A negative result may occur with  improper specimen collection/handling, submission of specimen other than nasopharyngeal swab, presence of viral mutation(s) within the areas targeted by this assay, and inadequate number of viral copies (<131 copies/mL). A negative result must be combined with clinical observations, patient history, and epidemiological information. The expected result is Negative. Fact Sheet for Patients:  https://www.moore.com/ Fact Sheet for Healthcare Providers:  https://www.young.biz/ This test is not yet ap proved or cleared by the [place] FDA and  has been authorized for detection and/or diagnosis of SARS-CoV-2 by FDA under an Emergency Use Authorization (EUA). This EUA will remain  in effect (meaning this test can be used) for the duration of the COVID-19 declaration under Section 564(b)(1) of the Act, 21 U.S.C. section 360bbb-3(b)(1), unless the authorization is terminated or revoked sooner.    Influenza A by PCR NEGATIVE NEGATIVE Final   Influenza B by PCR NEGATIVE NEGATIVE Final    Comment: (NOTE) The Xpert Xpress SARS-CoV-2/FLU/RSV assay is intended as an aid in  the diagnosis of influenza from Nasopharyngeal swab specimens and  should not be used as a sole basis for treatment. Nasal washings and  aspirates are unacceptable for Xpert Xpress SARS-CoV-2/FLU/RSV  testing. Fact Sheet for Patients: https://www.moore.com/ Fact Sheet for Healthcare  Providers: https://www.young.biz/ This test is not yet approved or cleared by the [place] FDA and  has been authorized for detection and/or diagnosis of SARS-CoV-2 by  FDA under an Emergency Use Authorization (EUA). This EUA will remain  in effect (meaning this test can be used) for the duration of the  Covid-19 declaration under Section 564(b)(1) of the Act, 21  U.S.C. section 360bbb-3(b)(1), unless the authorization is  terminated or revoked. Performed at [facility], [address], [place], [place] [address]          Radiology Studies: CT HEAD WO CONTRAST  Result Date: 12/26/2019 CLINICAL DATA:  Posttraumatic headache. EXAM: CT HEAD WITHOUT CONTRAST TECHNIQUE: Contiguous axial images were obtained from the base of the skull through the vertex without intravenous contrast. COMPARISON:  Head CT 10/26/2019, brain MRI 10/27/2019 FINDINGS: Brain: No intracranial hemorrhage, mass effect, or midline shift. Atrophy and ventriculomegaly are stable. The basilar cisterns are patent. No evidence of territorial infarct or acute ischemia. No extra-axial or intracranial fluid collection. Vascular: Atherosclerosis of skullbase vasculature  without hyperdense vessel or abnormal calcification. Skull: No fracture or focal lesion. Sinuses/Orbits: No acute findings. Chronic opacification of lower left mastoid air cells. Prior left cataract resection. Other: None. IMPRESSION: 1. No acute intracranial abnormality. No skull fracture. 2. Stable atrophy. Stable ventriculomegaly likely related to central atrophy, normal pressure hydrocephalus could have a similar appearance. Electronically Signed   By: Narda Rutherford M.D.   On: 12/26/2019 06:54   CT ABDOMEN PELVIS W CONTRAST  Result Date: 12/25/2019 CLINICAL DATA:  Abdominal pain. Nausea. EXAM: CT ABDOMEN AND PELVIS WITH CONTRAST TECHNIQUE: Multidetector CT imaging of the abdomen and pelvis was performed using the standard  protocol following bolus administration of intravenous contrast. CONTRAST:  88mL OMNIPAQUE IOHEXOL 300 MG/ML  SOLN COMPARISON:  CT scan of the pelvis dated 10/26/2019 FINDINGS: Lower chest: Small hiatal hernia. Otherwise normal. Hepatobiliary: Small focal area of fatty infiltration adjacent to the falciform ligament. Liver parenchyma is otherwise normal. Biliary tree is normal including the gallbladder. Pancreas: Unremarkable. No pancreatic ductal dilatation or surrounding inflammatory changes. Spleen: Normal in size without focal abnormality. Adrenals/Urinary Tract: Normal adrenal glands. 4 mm cyst in the mid left kidney. Kidneys are otherwise normal. No hydronephrosis. Bladder is empty. Stomach/Bowel: Tiny hiatal hernia. Stomach and small bowel appear normal including the terminal ileum. Tiny calcification at the tip of the cecum may represent a appendicoliths in a very small appendix. Vascular/Lymphatic: Aortic atherosclerosis. No enlarged abdominal or pelvic lymph nodes. Reproductive: Uterus and bilateral adnexa are unremarkable. Other: No abdominal wall hernia or abnormality. No abdominopelvic ascites. Musculoskeletal: No acute or significant osseous findings. IMPRESSION: Benign-appearing abdomen and pelvis. Aortic Atherosclerosis (ICD10-I70.0). Electronically Signed   By: Francene Boyers M.D.   On: 12/25/2019 15:14   DG Abdomen Acute W/Chest  Result Date: 12/25/2019 CLINICAL DATA:  Vomiting and constipation. EXAM: DG ABDOMEN ACUTE W/ 1V CHEST COMPARISON:  Chest radiograph December 12, 2010 FINDINGS: PA chest: Lungs are clear. Heart size and pulmonary vascularity are normal. No adenopathy. Supine and upright abdomen: There is moderate stool in the colon. There is no bowel dilatation or air-fluid level to suggest bowel obstruction. No free air. There are parent phleboliths in the pelvis. There is mid lumbar dextroscoliosis with degenerative change in the lumbar spine. IMPRESSION: No bowel obstruction or free  air. Moderate stool in colon. Lungs clear. Electronically Signed   By: Bretta Bang III M.D.   On: 12/25/2019 10:47   DG HIP UNILAT WITH PELVIS 2-3 VIEWS RIGHT  Result Date: 12/25/2019 CLINICAL DATA:  Unable to stand EXAM: DG HIP (WITH OR WITHOUT PELVIS) 2-3V RIGHT COMPARISON:  CT 12/25/2019 FINDINGS: Contrast material within the bladder. Pubic symphysis and rami appear intact. Limited evaluation of left femoral neck due to superimposition of the trochanter. No acute displaced fracture or malalignment on the right. IMPRESSION: 1. No acute osseous abnormality. 2. Contrast within the urinary bladder Electronically Signed   By: Jasmine Pang M.D.   On: 12/25/2019 18:13        Scheduled Meds: . cholecalciferol  1,000 Units Oral Daily  . dorzolamide-timolol  1 drop Both Eyes BID  . enoxaparin (LOVENOX) injection  40 mg Subcutaneous Q24H  . latanoprost  1 drop Both Eyes QHS  . pantoprazole  40 mg Oral Daily   Continuous Infusions: . sodium chloride 75 mL/hr at 12/25/19 2242    Assessment & Plan:   Principal Problem:   AKI (acute kidney injury) (HCC) Active Problems:   Hypertension   Hypokalemia   GERD (gastroesophageal reflux disease)  Fall   Abdominal pain  AKI: Likely due to prerenal secondary to dehydration and continuation of ACEI Now improved We will continue IV fluid - Avoid using renal toxic medications, hypotension and contrast dye (or carefully use) - Hold lisinopril  Hypertension -hold lisinopril due to AKI Will DC metoprolol as she is having bradycardia and pauses If need to will utilize IV hydralazine and initiate amlodipine in a.m. -IV hydralazine  Hypokalemia: K= 3.0  on admission. Repleted however potassium now is 2.8 Will aggressively replete and check post K Magnesium stable  Bradycardia -up to 3.5-second pause  On beta-blocker and nauseous and vomiting (vasovagal?) Resolved after stopping metoprolol.  Also her nausea and vomiting resolved  today. Continue monitoring on telemetry   Vomiting /nausea CT abd no acute abn. GI consulted, n.p.o. for possible EGD  GERD (gastroesophageal reflux disease): -protonix  Fall: X-ray of right hip is negative. -f/u CT-head -pt/ot-recommend SNF -per Percocet for right Hip pain  Abdominal pain: CT abdomen/pelvis negative.  Lipase normal 34. -Supportive care -As needed percocet    DVT prophylaxis: Lovenox Code Status: Full code Family Communication: None at bedside Disposition Plan: Needs snf when medically stable.  Currently will likely be here 1-2 more days as was having bradycardia with pauses yesterday and need to monitor today.  Having EGD done today.     LOS: 1 day   Time spent: 45 minutes with more than 50% on COC    Lynn Ito, MD [facility] Pager [phone]  If 7PM-7AM, please contact night-coverage [email or web address] Password [facility] 12/27/2019, 8:03 AM Patient ID: Kristen Richard, female   DOB: 1936-01-04, 84 y.o.   MRN: [id number]

## 2019-12-27 NOTE — H&P (Signed)
Wyline Mood, MD [address], [place], [place], [address] [address], [address], [place], [place], [address] Phone: [phone]  Fax: [phone]  Primary Care Physician:  [facility]   Pre-Procedure History & Physical: HPI:  Kristen Richard is a 84 y.o. female is here for an endoscopy    Past Medical History:  Diagnosis Date  . Fall    RISK  . Hypertension   . Vertigo     Past Surgical History:  Procedure Laterality Date  . CATARACT EXTRACTION W/PHACO Left 12/28/2018   Procedure: CATARACT EXTRACTION PHACO AND INTRAOCULAR LENS PLACEMENT (IOC) LEFT;  Surgeon: Elliot Cousin, MD;  Location: [facility] ORS;  Service: Ophthalmology;  Laterality: Left;  [place]   01:35 CDE 15.57 Fluid pack lot # [id number]  . TUBAL LIGATION      Prior to Admission medications   Medication Sig Start Date End Date Taking? Authorizing Provider  acetaminophen (TYLENOL) 325 MG tablet Take 325-650 mg by mouth every 6 (six) hours as needed for moderate pain or headache.    Yes [provider]  cholecalciferol (VITAMIN D3) 25 MCG (1000 UNIT) tablet Take 1,000 Units by mouth daily. 12/10/19  Yes [provider]  dorzolamide-timolol (COSOPT) 22.3-6.8 MG/ML ophthalmic solution Place 1 drop into both eyes 2 (two) times daily. 09/25/19  Yes [provider]  lisinopril (ZESTRIL) 40 MG tablet Take 1 tablet (40 mg total) by mouth daily. 10/31/19 12/25/19 Yes Sreenath, Sudheer B, MD  LUMIGAN 0.01 % SOLN Place 1 drop into both eyes at bedtime. 09/12/19  Yes [provider]  meclizine (ANTIVERT) 12.5 MG tablet Take 1 tablet (12.5 mg total) by mouth 3 (three) times daily as needed for dizziness (Or vertigo). 10/28/19  Yes Sreenath, Sudheer B, MD  metoprolol tartrate (LOPRESSOR) 25 MG tablet Take 25 mg by mouth 2 (two) times daily.   Yes [provider]  omeprazole (PRILOSEC) 20 MG capsule Take 20 mg by mouth daily.    [provider]    Allergies  as of 12/25/2019 - Review Complete 12/25/2019  Allergen Reaction Noted  . Sulfa antibiotics Swelling 10/01/2013  . Acetazolamide Nausea And Vomiting 12/21/2018    Family History  Problem Relation Age of Onset  . Heart attack Father     Social History   Socioeconomic History  . Marital status: Widowed    Spouse name: Not on file  . Number of children: Not on file  . Years of education: Not on file  . Highest education level: Not on file  Occupational History  . Not on file  Tobacco Use  . Smoking status: Former Games developer  . Smokeless tobacco: Former Engineer, water and Sexual Activity  . Alcohol use: Not Currently  . Drug use: Never  . Sexual activity: Not on file  Other Topics Concern  . Not on file  Social History Narrative  . Not on file   Social Determinants of Health   Financial Resource Strain:   . Difficulty of Paying Living Expenses: Not on file  Food Insecurity:   . Worried About Programme researcher, broadcasting/film/video in the Last Year: Not on file  . Ran Out of Food in the Last Year: Not on file  Transportation Needs:   . Lack of Transportation (Medical): Not on file  . Lack of Transportation (Non-Medical): Not on file  Physical Activity:   . Days of Exercise per Week: Not on file  . Minutes of Exercise per Session: Not  on file  Stress:   . Feeling of Stress : Not on file  Social Connections:   . Frequency of Communication with Friends and Family: Not on file  . Frequency of Social Gatherings with Friends and Family: Not on file  . Attends Religious Services: Not on file  . Active Member of Clubs or Organizations: Not on file  . Attends Archivist Meetings: Not on file  . Marital Status: Not on file  Intimate Partner Violence:   . Fear of Current or Ex-Partner: Not on file  . Emotionally Abused: Not on file  . Physically Abused: Not on file  . Sexually Abused: Not on file    Review of Systems: See HPI, otherwise negative ROS  Physical Exam: BP (!) 149/60    Pulse 96   Temp (!) 96.9 F (36.1 C) (Temporal)   Resp 16   Ht 5\' 3"  (1.6 m)   Wt 67.8 kg   SpO2 100%   BMI 26.48 kg/m  General:   Alert,  pleasant and cooperative in NAD Head:  Normocephalic and atraumatic. Neck:  Supple; no masses or thyromegaly. Lungs:  Clear throughout to auscultation, normal respiratory effort.    Heart:  +S1, +S2, Regular rate and rhythm, No edema. Abdomen:  Soft, nontender and nondistended. Normal bowel sounds, without guarding, and without rebound.   Neurologic:  Alert and  oriented x4;  grossly normal neurologically.  Impression/Plan: Kristen Richard is here for an endoscopy  to be performed for  evaluation of vomiting    Risks, benefits, limitations, and alternatives regarding endoscopy have been reviewed with the patient.  Questions have been answered.  All parties agreeable.   Jonathon Bellows, MD  12/27/2019, 12:44 PM

## 2019-12-27 NOTE — NC FL2 (Signed)
[place] LEVEL OF CARE SCREENING TOOL     IDENTIFICATION  Patient Name: Kristen Richard Birthdate: 1936/03/28 Sex: female Admission Date (Current Location): 12/25/2019  [place] and [place] Number:  Engineering geologist and Address:  [facility], [address] [place], [place] [address]      Provider Number: [id number]  Attending Physician Name and Address:  Nolberto Hanlon, MD  Relative Name and Phone Number:       Current Level of Care: Hospital Recommended Level of Care: [place] Prior Approval Number:    Date Approved/Denied:   PASRR Number: [id number]  Discharge Plan: SNF    Current Diagnoses: Patient Active Problem List   Diagnosis Date Noted  . AKI (acute kidney injury) ([place]) 12/25/2019  . Abdominal pain 12/25/2019  . Hypertension   . Hypokalemia   . GERD (gastroesophageal reflux disease)   . Fall   . Dehydration   . Pressure injury of skin 10/27/2019  . Rhabdomyolysis 10/26/2019    Orientation RESPIRATION BLADDER Height & Weight     Self, Time, Situation, Place  Normal Continent Weight: 149 lb 8 oz (67.8 kg) Height:  5\' 3"  (160 cm)  BEHAVIORAL SYMPTOMS/MOOD NEUROLOGICAL BOWEL NUTRITION STATUS      Incontinent Diet(NPO at this time, please see d/c summary)  AMBULATORY STATUS COMMUNICATION OF NEEDS Skin   Extensive Assist Verbally Normal                       Personal Care Assistance Level of Assistance  Dressing, Feeding, Bathing Bathing Assistance: Maximum assistance Feeding assistance: Independent Dressing Assistance: Maximum assistance     Functional Limitations Info  Sight, Hearing, Speech Sight Info: Adequate Hearing Info: Adequate Speech Info: Adequate    SPECIAL CARE FACTORS FREQUENCY  PT (By licensed PT), OT (By licensed OT)     PT Frequency: 5x OT Frequency: 5x            Contractures Contractures Info: Not present    Additional Factors Info  Code Status, Allergies  Code Status Info: Partial Allergies Info: Sulfa Antibiotics, Acetazolamide           Current Medications (12/27/2019):  This is the current hospital active medication list Current Facility-Administered Medications  Medication Dose Route Frequency Provider Last Rate Last Admin  . 0.9 %  sodium chloride infusion   Intravenous Continuous Ivor Costa, MD 75 mL/hr at 12/27/19 0839 New Bag at 12/27/19 0839  . acetaminophen (TYLENOL) tablet 650 mg  650 mg Oral Q6H PRN Ivor Costa, MD      . cholecalciferol (VITAMIN D3) tablet 1,000 Units  1,000 Units Oral Daily Ivor Costa, MD   1,000 Units at 12/27/19 [phone]  . dorzolamide-timolol (COSOPT) 22.3-6.8 MG/ML ophthalmic solution 1 drop  1 drop Both Eyes BID Ivor Costa, MD   1 drop at 12/27/19 0835  . enoxaparin (LOVENOX) injection 40 mg  40 mg Subcutaneous Q24H Ivor Costa, MD   40 mg at 12/26/19 2136  . hydrALAZINE (APRESOLINE) tablet 25 mg  25 mg Oral TID PRN Ivor Costa, MD      . latanoprost (XALATAN) 0.005 % ophthalmic solution 1 drop  1 drop Both Eyes QHS Ivor Costa, MD   1 drop at 12/26/19 2136  . meclizine (ANTIVERT) tablet 12.5 mg  12.5 mg Oral TID PRN Ivor Costa, MD      . morphine 2 MG/ML injection 1 mg  1 mg Intravenous Q4H PRN Nolberto Hanlon, MD  1 mg at 12/26/19 1248  . ondansetron (ZOFRAN) injection 4 mg  4 mg Intravenous Q6H PRN Lynn Ito, MD   4 mg at 12/26/19 1550  . oxyCODONE-acetaminophen (PERCOCET/ROXICET) 5-325 MG per tablet 1 tablet  1 tablet Oral Q4H PRN Lorretta Harp, MD   1 tablet at 12/27/19 0834  . pantoprazole (PROTONIX) EC tablet 40 mg  40 mg Oral Daily Lorretta Harp, MD   40 mg at 12/27/19 0834  . polyethylene glycol (MIRALAX / GLYCOLAX) packet 17 g  17 g Oral Daily PRN Lorretta Harp, MD         Discharge Medications: Please see discharge summary for a list of discharge medications.  Relevant Imaging Results:  Relevant Lab Results:   Additional Information SSN:[id number]  Reuel Boom Jacaria Colburn, LCSW

## 2019-12-27 NOTE — Progress Notes (Signed)
Occupational Therapy Treatment Patient Details Name: Kristen Richard MRN: [id number] DOB: February 23, 1936 Today's Date: 12/27/2019    History of present illness Pt is an 84 y.o. female presenting to hospital with generalized weakness, severe fatigue, unable to stand/walk; not eating; smell off past 2 weeks.  Pt admitted with AKI, htn, hypokalemia, abdominal pain, and h/o recent fall.  PMH includes CKD, htn, fall risk, vertigo.   OT comments  [name] was seen for OT treatment this date. Upon arrival to room pt awake/alert, semi-supine in bed, denies pain, and agreeable to OT tx session. With mild encouragement, pt agreeable to attempting bed mobility for seated ADL management this date. Pt comes to sitting EOB given moderate assist and cueing for sequencing this date. Upon coming into sitting, pt endorses 8/10 dizziness and reports feeling as if she is falling backward when sitting still. Pt endorses increased symptoms when turning her head from side to side. Pt educated on use of therapeutic rest breaks during functional mobility and pt endorses dizziness improves with time in sitting. OT engages pt in seated balance activity including cross-body crawls and provides education on strategies to support symptom management with positional changes. Pt declines further seated ADL activity this date. This Thereasa Richard provides education on use of log-roll technique to return to bed. Pt requires CGA for lateral scoot t/f at EOB and moderate assist for mgt of BLE during sit>sup t/f. MD in room at end of session to see pt. Pt left in bed with MD present, bed alarm set. Pt making good progress toward goals and continues to benefit from skilled OT services to maximize return to PLOF and minimize risk of future falls, injury, caregiver burden, and readmission. Will continue to follow POC. Discharge recommendation remains appropriate.    Follow Up Recommendations  SNF;Other (comment)    Equipment Recommendations  None recommended  by OT    Recommendations for Other Services      Precautions / Restrictions Precautions Precautions: Fall Precaution Comments: NPO Restrictions Weight Bearing Restrictions: No       Mobility Bed Mobility Overal bed mobility: Needs Assistance Bed Mobility: Supine to Sit;Sit to Supine     Supine to sit: Mod assist Sit to supine: Mod assist   General bed mobility comments: Pt requires +1 moderate assistance for sit<> supine t/f this date. She comes to sitting EOB with moderate cueing for hand placement and sequencing. Therapist provides education on use of log-roll technique to facilitate safety and functional independence during bed mobility.  Transfers Overall transfer level: Needs assistance Equipment used: Rolling walker (2 wheeled) Transfers: Lateral/Scoot Transfers          Lateral/Scoot Transfers: Supervision;Min guard General transfer comment: Pt performs lateral scoot t/f at EOB with min guard for safety t/o . Pt noted with several instances of anterior/posterior LOB during functional mobility this date. contact guard provided t/o session for pt safety. Pt endorses increased dizziness upon sitting.    Balance Overall balance assessment: Needs assistance Sitting-balance support: Feet supported;Single extremity supported Sitting balance-Leahy Scale: Poor Sitting balance - Comments: Pt very limited by increased dizziness with upright positioning today. Requires at least contact guard for safety while seated EOB 2/2 intermittent LOB with head positional changes.                                   ADL either performed or assessed with clinical judgement   ADL Overall ADL's : Needs  assistance/impaired                                       General ADL Comments: Pt continues to be functionally limited by poor activity tolerance, generalized weakness, and pain this date. She endorses increased dizziness/vertigo symptoms with sup>sit transfer  this date and requires increased time/effort to perform functional mobility. 2/2 dizziness she is unable to engage in functional tasks, but does maintain seated balance at EOB for ~20 min.     Vision Baseline Vision/History: Wears glasses Patient Visual Report: No change from baseline     Perception     Praxis      Cognition Arousal/Alertness: Awake/alert Behavior During Therapy: WFL for tasks assessed/performed Overall Cognitive Status: Within Functional Limits for tasks assessed                                 General Comments: Pt continues to display some general confusion and requires increased time/prompting to follow VCs this date. Overall WFLs for tasks assessed. Pt also endorses significant (8-9/10) dizziness with movement this date which limited ability to attend t/o session.        Exercises Other Exercises Other Exercises: Pt educated on log-roll bed mobility technique with pt able to return demonstrate understanding during sit>sup t/f this date. Pt requires moderate assist for all bed mobility due to weakness and fatigue. Other Exercises: OT engages pt in sitting balance activities including cross body crawls while maintaining head in neutral to minimize dizziness/vertigo symptoms. Pt requires support to maintain seated balance at EOB this date. See above for detail.   Shoulder Instructions       General Comments      Pertinent Vitals/ Pain       Pain Assessment: No/denies pain Pain Location: Pt denies pain at rest, states legs are not painful unless touched. Monitored t/o session. Pain Intervention(s): Monitored during session  Home Living                                          Prior Functioning/Environment              Frequency  Min 1X/week        Progress Toward Goals  OT Goals(current goals can now be found in the care plan section)  Progress towards OT goals: Progressing toward goals  Acute Rehab OT  Goals Patient Stated Goal: to go home OT Goal Formulation: With patient Time For Goal Achievement: 01/09/20 Potential to Achieve Goals: Good  Plan Discharge plan remains appropriate;Frequency remains appropriate    Co-evaluation                 AM-PAC OT "6 Clicks" Daily Activity     Outcome Measure   Help from another person eating meals?: A Little(Pt NPO) Help from another person taking care of personal grooming?: A Little Help from another person toileting, which includes using toliet, bedpan, or urinal?: A Lot Help from another person bathing (including washing, rinsing, drying)?: A Lot Help from another person to put on and taking off regular upper body clothing?: A Little Help from another person to put on and taking off regular lower body clothing?: A Lot 6 Click Score: 15    End  of Session Equipment Utilized During Treatment: Gait belt;Rolling walker  OT Visit Diagnosis: Other abnormalities of gait and mobility (R26.89);Repeated falls (R29.6);Pain Pain - Right/Left: (both) Pain - part of body: Leg;Ankle and joints of foot;Knee   Activity Tolerance Patient tolerated treatment well   Patient Left in bed;with call bell/phone within reach;with bed alarm set;Other (comment)(With MD in room.)   Nurse Communication          Time: [phone] OT Time Calculation (min): 38 min  Charges: OT General Charges $OT Visit: 1 Visit OT Treatments $Self Care/Home Management : 38-52 mins  Rockney Ghee, M.S., OTR/L Ascom: [phone] 12/27/19, 11:15 AM

## 2019-12-27 NOTE — Progress Notes (Signed)
Pt returns from endo, VSS. Pt offered clear liquid diet. I will continue to assess.

## 2019-12-27 NOTE — Consult Note (Signed)
Kristen Richard , MD [address], [place], [place], [address] [address], [place], [place], [address] Phone: [phone]  Fax: [phone]  Consultation  Referring Provider:    Dr Marylu Lund Primary Care Physician:  [facility] Primary Gastroenterologist:  None         Reason for Consultation:     Vomiting   Date of Admission:  12/25/2019 Date of Consultation:  12/27/2019         HPI:   Kristen Richard is a 84 y.o. female admitted 12/24/2018 from whole after a fall and had some abdominal pain nausea vomiting.  Appears that her symptoms began a few days earlier.  Or none patient noted to be in AKI.  She underwent a CT scan of the abdomen and pelvis on 12/25/2019 which showed no acute abnormalities.  BMP this morning is normal.  Hemoglobin of 12.6 g with a normal white cell count.  On admission lipase was normal.  Says for the past few months develops nausea everytime she eats and throws up. Denies any abdominal pain. No other complaints.   Past Medical History:  Diagnosis Date  . Fall    RISK  . Hypertension   . Vertigo     Past Surgical History:  Procedure Laterality Date  . CATARACT EXTRACTION W/PHACO Left 12/28/2018   Procedure: CATARACT EXTRACTION PHACO AND INTRAOCULAR LENS PLACEMENT (IOC) LEFT;  Surgeon: Elliot Cousin, MD;  Location: [facility] ORS;  Service: Ophthalmology;  Laterality: Left;  [place]   01:35 CDE 15.57 Fluid pack lot # [id number]  . TUBAL LIGATION      Prior to Admission medications   Medication Sig Start Date End Date Taking? Authorizing Provider  acetaminophen (TYLENOL) 325 MG tablet Take 325-650 mg by mouth every 6 (six) hours as needed for moderate pain or headache.    Yes [provider]  cholecalciferol (VITAMIN D3) 25 MCG (1000 UNIT) tablet Take 1,000 Units by mouth daily. 12/10/19  Yes [provider]  dorzolamide-timolol (COSOPT) 22.3-6.8 MG/ML ophthalmic solution Place 1 drop into both eyes 2 (two) times daily.  09/25/19  Yes [provider]  lisinopril (ZESTRIL) 40 MG tablet Take 1 tablet (40 mg total) by mouth daily. 10/31/19 12/25/19 Yes Sreenath, Sudheer B, MD  LUMIGAN 0.01 % SOLN Place 1 drop into both eyes at bedtime. 09/12/19  Yes [provider]  meclizine (ANTIVERT) 12.5 MG tablet Take 1 tablet (12.5 mg total) by mouth 3 (three) times daily as needed for dizziness (Or vertigo). 10/28/19  Yes Sreenath, Sudheer B, MD  metoprolol tartrate (LOPRESSOR) 25 MG tablet Take 25 mg by mouth 2 (two) times daily.   Yes [provider]  omeprazole (PRILOSEC) 20 MG capsule Take 20 mg by mouth daily.    [provider]    Family History  Problem Relation Age of Onset  . Heart attack Father      Social History   Tobacco Use  . Smoking status: Former Games developer  . Smokeless tobacco: Former Engineer, water Use Topics  . Alcohol use: Not on file  . Drug use: Not on file    Allergies as of 12/25/2019 - Review Complete 12/25/2019  Allergen Reaction Noted  . Sulfa antibiotics Swelling 10/01/2013  . Acetazolamide Nausea And Vomiting 12/21/2018    Review of Systems:    All systems reviewed and negative except where noted in HPI.   Physical Exam:  Vital signs in last 24 hours: Temp:  [97.5 F (36.4  C)-98.4 F (36.9 C)] 97.5 F (36.4 C) (02/18 0747) Pulse Rate:  [49-74] 74 (02/18 0747) Resp:  [16-20] 19 (02/18 0747) BP: (106-148)/(53-64) 148/62 (02/18 0747) SpO2:  [96 %-100 %] 100 % (02/18 0747) Last BM Date: (pt states "over 2 weeks ago"- refusing med to help) General:   Pleasant, cooperative in NAD Head:  Normocephalic and atraumatic. Eyes:   No icterus.   Conjunctiva pink. PERRLA. Lungs: Respirations even and unlabored. Lungs clear to auscultation bilaterally.   No wheezes, crackles, or rhonchi.  Heart:  Regular rate and rhythm;  Without murmur, clicks, rubs or gallops Abdomen:  Soft, nondistended, nontender. Normal bowel sounds. No appreciable masses or  hepatomegaly.  No rebound or guarding.  Neurologic:  Alert and oriented x3;  grossly normal neurologically. Psych:  Alert and cooperative. Normal affect.  LAB RESULTS: Recent Labs    12/25/19 0959 12/26/19 0452  WBC 10.9* 8.3  HGB 15.1* 12.6  HCT 46.0 38.2  PLT 299 208   BMET Recent Labs    12/25/19 0959 12/25/19 0959 12/26/19 0452 12/26/19 1802 12/27/19 0415  NA 140  --  141  --  140  K 3.0*   < > 2.8* 3.6 3.7  CL 93*  --  102  --  104  CO2 25  --  25  --  26  GLUCOSE 112*  --  85  --  83  BUN 29*  --  19  --  19  CREATININE 1.17*  --  0.82  --  0.86  CALCIUM 10.0  --  8.7*  --  8.3*   < > = values in this interval not displayed.   LFT Recent Labs    12/25/19 0959  PROT 8.1  ALBUMIN 4.0  AST 23  ALT 13  ALKPHOS 60  BILITOT 1.7*   PT/INR No results for input(s): LABPROT, INR in the last 72 hours.  STUDIES: CT HEAD WO CONTRAST  Result Date: 12/26/2019 CLINICAL DATA:  Posttraumatic headache. EXAM: CT HEAD WITHOUT CONTRAST TECHNIQUE: Contiguous axial images were obtained from the base of the skull through the vertex without intravenous contrast. COMPARISON:  Head CT 10/26/2019, brain MRI 10/27/2019 FINDINGS: Brain: No intracranial hemorrhage, mass effect, or midline shift. Atrophy and ventriculomegaly are stable. The basilar cisterns are patent. No evidence of territorial infarct or acute ischemia. No extra-axial or intracranial fluid collection. Vascular: Atherosclerosis of skullbase vasculature without hyperdense vessel or abnormal calcification. Skull: No fracture or focal lesion. Sinuses/Orbits: No acute findings. Chronic opacification of lower left mastoid air cells. Prior left cataract resection. Other: None. IMPRESSION: 1. No acute intracranial abnormality. No skull fracture. 2. Stable atrophy. Stable ventriculomegaly likely related to central atrophy, normal pressure hydrocephalus could have a similar appearance. Electronically Signed   By: Narda Rutherford M.D.    On: 12/26/2019 06:54   CT ABDOMEN PELVIS W CONTRAST  Result Date: 12/25/2019 CLINICAL DATA:  Abdominal pain. Nausea. EXAM: CT ABDOMEN AND PELVIS WITH CONTRAST TECHNIQUE: Multidetector CT imaging of the abdomen and pelvis was performed using the standard protocol following bolus administration of intravenous contrast. CONTRAST:  17mL OMNIPAQUE IOHEXOL 300 MG/ML  SOLN COMPARISON:  CT scan of the pelvis dated 10/26/2019 FINDINGS: Lower chest: Small hiatal hernia. Otherwise normal. Hepatobiliary: Small focal area of fatty infiltration adjacent to the falciform ligament. Liver parenchyma is otherwise normal. Biliary tree is normal including the gallbladder. Pancreas: Unremarkable. No pancreatic ductal dilatation or surrounding inflammatory changes. Spleen: Normal in size without focal abnormality. Adrenals/Urinary Tract: Normal adrenal glands.  4 mm cyst in the mid left kidney. Kidneys are otherwise normal. No hydronephrosis. Bladder is empty. Stomach/Bowel: Tiny hiatal hernia. Stomach and small bowel appear normal including the terminal ileum. Tiny calcification at the tip of the cecum may represent a appendicoliths in a very small appendix. Vascular/Lymphatic: Aortic atherosclerosis. No enlarged abdominal or pelvic lymph nodes. Reproductive: Uterus and bilateral adnexa are unremarkable. Other: No abdominal wall hernia or abnormality. No abdominopelvic ascites. Musculoskeletal: No acute or significant osseous findings. IMPRESSION: Benign-appearing abdomen and pelvis. Aortic Atherosclerosis (ICD10-I70.0). Electronically Signed   By: Lorriane Shire M.D.   On: 12/25/2019 15:14   DG Abdomen Acute W/Chest  Result Date: 12/25/2019 CLINICAL DATA:  Vomiting and constipation. EXAM: DG ABDOMEN ACUTE W/ 1V CHEST COMPARISON:  Chest radiograph December 12, 2010 FINDINGS: PA chest: Lungs are clear. Heart size and pulmonary vascularity are normal. No adenopathy. Supine and upright abdomen: There is moderate stool in the colon.  There is no bowel dilatation or air-fluid level to suggest bowel obstruction. No free air. There are parent phleboliths in the pelvis. There is mid lumbar dextroscoliosis with degenerative change in the lumbar spine. IMPRESSION: No bowel obstruction or free air. Moderate stool in colon. Lungs clear. Electronically Signed   By: Lowella Grip III M.D.   On: 12/25/2019 10:47   DG HIP UNILAT WITH PELVIS 2-3 VIEWS RIGHT  Result Date: 12/25/2019 CLINICAL DATA:  Unable to stand EXAM: DG HIP (WITH OR WITHOUT PELVIS) 2-3V RIGHT COMPARISON:  CT 12/25/2019 FINDINGS: Contrast material within the bladder. Pubic symphysis and rami appear intact. Limited evaluation of left femoral neck due to superimposition of the trochanter. No acute displaced fracture or malalignment on the right. IMPRESSION: 1. No acute osseous abnormality. 2. Contrast within the urinary bladder Electronically Signed   By: Donavan Foil M.D.   On: 12/25/2019 18:13      Impression / Plan:   Kristen Richard is a 84 y.o. y/o female admitted to the hospital with nausea vomiting and abdominal pain.  Labs are within normal limits.  CT scan of the abdomen and pelvis did not show any acute abnormalities.No abdominal pain presently but has long standing nausea and vomiting .   Plan  1. EGD today and if negative will need HIDA scan to evaluate gall bladder  I have discussed alternative options, risks & benefits,  which include, but are not limited to, bleeding, infection, perforation,respiratory complication & drug reaction.  The patient agrees with this plan & written consent will be obtained.        Thank you for involving me in the care of this patient.      LOS: 1 day   Jonathon Bellows, MD  12/27/2019, 8:45 AM

## 2019-12-27 NOTE — Op Note (Signed)
[facility] Gastroenterology Patient Name: Kristen Richard Procedure Date: 12/27/2019 12:29 PM MRN: [id number] Account #: [id number] Date of Birth: 1936-09-29 Admit Type: Inpatient Age: 84 Room: [facility] ENDO ROOM 3 Gender: Female Note Status: Finalized Procedure:             Upper GI endoscopy Indications:           Nausea with vomiting Providers:             Wyline Mood MD, MD Referring MD:          [facility], MD (Referring MD) Medicines:             Monitored Anesthesia Care Complications:         No immediate complications. Procedure:             Pre-Anesthesia Assessment:                        - Prior to the procedure, a History and Physical was                         performed, and patient medications, allergies and                         sensitivities were reviewed. The patient's tolerance                         of previous anesthesia was reviewed.                        - The risks and benefits of the procedure and the                         sedation options and risks were discussed with the                         patient. All questions were answered and informed                         consent was obtained.                        - ASA Grade Assessment: III - A patient with severe                         systemic disease.                        After obtaining informed consent, the endoscope was                         passed under direct vision. Throughout the procedure,                         the patient's blood pressure, pulse, and oxygen                         saturations were monitored continuously. The Endoscope                         was introduced through  the mouth, and advanced to the                         third part of duodenum. The upper GI endoscopy was                         accomplished with ease. The patient tolerated the                         procedure well. Findings:      The examined duodenum was  normal.      Diffuse moderate inflammation characterized by congestion (edema),       erythema and granularity was found in the gastric body and in the       gastric antrum. Biopsies were taken with a cold forceps for histology.      One benign-appearing, intrinsic mild stenosis was found at the       gastroesophageal junction. This stenosis measured less than one cm (in       length). The stenosis was traversed.      The cardia and gastric fundus were normal on retroflexion. Impression:            - Normal examined duodenum.                        - Gastritis. Biopsied.                        - Benign-appearing esophageal stenosis. Recommendation:        - Return patient to hospital [facility] for ongoing care.                        - Advance diet as tolerated.                        - Use Prilosec (omeprazole) 40 mg PO daily.                        - Await pathology results.                        - If nausea persists get HIDA scan Procedure Code(s):     --- Professional ---                        [phone], Esophagogastroduodenoscopy, flexible,                         transoral; with biopsy, single or multiple Diagnosis Code(s):     --- Professional ---                        K29.70, Gastritis, unspecified, without bleeding                        K22.2, Esophageal obstruction                        R11.2, Nausea with vomiting, unspecified CPT copyright 2019 American Medical Association. All rights reserved. The codes documented in this report are preliminary and upon coder review may  be revised to meet current compliance requirements. Jonathon Bellows,  MD Wyline Mood MD, MD 12/27/2019 12:53:30 PM This report has been signed electronically. Number of Addenda: 0 Note Initiated On: 12/27/2019 12:29 PM Estimated Blood Loss:  Estimated blood loss: none.      [facility]

## 2019-12-27 NOTE — Transfer of Care (Signed)
Immediate Anesthesia Transfer of Care Note  Patient: Kristen Richard  Procedure(s) Performed: ESOPHAGOGASTRODUODENOSCOPY (EGD) WITH PROPOFOL (N/A )  Patient Location: Endoscopy Unit  Anesthesia Type:General  Level of Consciousness: drowsy and responds to stimulation  Airway & Oxygen Therapy: Patient Spontanous Breathing and Patient connected to face mask oxygen  Post-op Assessment: Report given to RN and Post -op Vital signs reviewed and stable  Post vital signs: Reviewed and stable  Last Vitals:  Vitals Value Taken Time  BP    Temp    Pulse    Resp    SpO2      Last Pain:  Vitals:   12/27/19 1221  TempSrc: Temporal  PainSc: 0-No pain         Complications: No apparent anesthesia complications

## 2019-12-28 ENCOUNTER — Encounter: Payer: Self-pay | Admitting: *Deleted

## 2019-12-28 LAB — GLUCOSE, CAPILLARY
Glucose-Capillary: 61 mg/dL — ABNORMAL LOW (ref 70–99)
Glucose-Capillary: 63 mg/dL — ABNORMAL LOW (ref 70–99)
Glucose-Capillary: 92 mg/dL (ref 70–99)

## 2019-12-28 MED ORDER — BOOST / RESOURCE BREEZE PO LIQD CUSTOM
1.0000 | Freq: Three times a day (TID) | ORAL | Status: DC
  Administered 2019-12-28 – 2019-12-29 (×3): 1 via ORAL
  Administered 2019-12-31: 237 mL via ORAL

## 2019-12-28 MED ORDER — ADULT MULTIVITAMIN W/MINERALS CH
1.0000 | ORAL_TABLET | Freq: Every day | ORAL | Status: DC
  Administered 2019-12-29 – 2020-01-01 (×4): 1 via ORAL
  Filled 2019-12-28 (×4): qty 1

## 2019-12-28 MED ORDER — DEXTROSE-NACL 5-0.45 % IV SOLN: INTRAVENOUS | Status: DC

## 2019-12-28 MED ORDER — DEXTROSE-NACL 5-0.9 % IV SOLN: INTRAVENOUS | Status: DC

## 2019-12-28 NOTE — Progress Notes (Signed)
MD notified of patient low CBG this a.m. I will continue to assess.

## 2019-12-28 NOTE — Progress Notes (Signed)
Hypoglycemic Event  CBG: 63  Treatment: juice   Symptoms: none  Follow-up CBG: [id number] CBG Result:63  Possible Reasons for Event: poor po intake  Comments/MD notified: with recheck patient given apple juice with one pack of sugar added. Recheck was at 1003 and shows 92.     Beverlyn Roux

## 2019-12-28 NOTE — Progress Notes (Signed)
PROGRESS NOTE    Kristen Richard  FBX:[id number] DOB: 1936-02-14 DOA: 12/25/2019 PCP: [facility]    Brief Narrative:  Kristen Richard is a 84 y.o. female with medical history significant of hypertension, GERD, vertigo, fall, who presents with fall, right hip pain, nausea, vomiting, diarrhea. pt was found to have WBC 10.9, lipase 34, Covid PCR negative, CK 43, AKI with creatinine 1.17, BUN 29, temperature 97.5, blood pressure 140/73, tachycardia, oxygen saturation 100% on room air.  CT of abdomen/pelvis is negative.  X-ray of her right hip/pelvis negative.  Patient is placed on MedSurg bed for observation.   Consultants:   none  Procedures: CT  Antimicrobials:   none   Subjective: Earlier this a.m. she had no nausea.  Her blood glucose level 63 and was given OJ.  With increase in blood glucose to 92 she denied any nausea or vomiting to me.  However later nursing contacted me that she vomited with physical therapy. Objective: Vitals:   12/27/19 1944 12/28/19 0452 12/28/19 0807 12/28/19 1209  BP: (!) 143/67 (!) 150/59 (!) 146/65 125/74  Pulse: 70 (!) 58 74 86  Resp: 18 18    Temp: 97.8 F (36.6 C) 98.2 F (36.8 C) 98.1 F (36.7 C) 97.7 F (36.5 C)  TempSrc: Oral Oral Oral Oral  SpO2: 100% 100% 99% 100%  Weight:      Height:        Intake/Output Summary (Last 24 hours) at 12/28/2019 1314 Last data filed at 12/28/2019 0930 Gross per 24 hour  Intake 1388.6 ml  Output 1650 ml  Net -261.4 ml   Filed Weights   12/25/19 0939 12/25/19 2327 12/27/19 1221  Weight: 87.1 kg 67.8 kg 67.8 kg    Examination:  General exam: Appears calm and comfortable , nad Respiratory system: Clear to auscultation. Respiratory effort normal. Cardiovascular system: S1 & S2 heard, RRR. No  murmurs, rubs, gallops or clicks.  Gastrointestinal system: Abdomen is nondistended, soft and nontender. Normal bowel sounds heard. Central nervous system: Alert and awake Extremities: No  edema Skin: Warm dry Psychiatry: . Mood & affect appropriate in current setting.     Data Reviewed: I have personally reviewed following labs and imaging studies  CBC: Recent Labs  Lab 12/25/19 0959 12/26/19 0452  WBC 10.9* 8.3  NEUTROABS 9.3*  --   HGB 15.1* 12.6  HCT 46.0 38.2  MCV 86.5 86.8  PLT 299 208   Basic Metabolic Panel: Recent Labs  Lab 12/25/19 0959 12/26/19 0452 12/26/19 1802 12/27/19 0415  NA 140 141  --  140  K 3.0* 2.8* 3.6 3.7  CL 93* 102  --  104  CO2 25 25  --  26  GLUCOSE 112* 85  --  83  BUN 29* 19  --  19  CREATININE 1.17* 0.82  --  0.86  CALCIUM 10.0 8.7*  --  8.3*  MG 1.7 1.8  --   --    GFR: Estimated Creatinine Clearance: 47.5 mL/min (by C-G formula based on SCr of 0.86 mg/dL). Liver Function Tests: Recent Labs  Lab 12/25/19 0959  AST 23  ALT 13  ALKPHOS 60  BILITOT 1.7*  PROT 8.1  ALBUMIN 4.0   Recent Labs  Lab 12/25/19 0959  LIPASE 34   No results for input(s): AMMONIA in the last 168 hours. Coagulation Profile: No results for input(s): INR, PROTIME in the last 168 hours. Cardiac Enzymes: Recent Labs  Lab 12/25/19 0959  CKTOTAL 43   BNP (last  3 results) No results for input(s): PROBNP in the last 8760 hours. HbA1C: No results for input(s): HGBA1C in the last 72 hours. CBG: Recent Labs  Lab 12/26/19 0820 12/27/19 0804 12/28/19 0808 12/28/19 0836 12/28/19 1003  GLUCAP 73 74 61* 63* 92   Lipid Profile: No results for input(s): CHOL, HDL, LDLCALC, TRIG, CHOLHDL, LDLDIRECT in the last 72 hours. Thyroid Function Tests: No results for input(s): TSH, T4TOTAL, FREET4, T3FREE, THYROIDAB in the last 72 hours. Anemia Panel: No results for input(s): VITAMINB12, FOLATE, FERRITIN, TIBC, IRON, RETICCTPCT in the last 72 hours. Sepsis Labs: No results for input(s): PROCALCITON, LATICACIDVEN in the last 168 hours.  Recent Results (from the past 240 hour(s))  Respiratory Panel by RT PCR (Flu A&B, Covid) - Nasopharyngeal  Swab     Status: None   Collection Time: 12/25/19 11:10 AM   Specimen: Nasopharyngeal Swab  Result Value Ref Range Status   SARS Coronavirus 2 by RT PCR NEGATIVE NEGATIVE Final    Comment: (NOTE) SARS-CoV-2 target nucleic acids are NOT DETECTED. The SARS-CoV-2 RNA is generally detectable in upper respiratoy specimens during the acute phase of infection. The lowest concentration of SARS-CoV-2 viral copies this assay can detect is 131 copies/mL. A negative result does not preclude SARS-Cov-2 infection and should not be used as the sole basis for treatment or other patient management decisions. A negative result may occur with  improper specimen collection/handling, submission of specimen other than nasopharyngeal swab, presence of viral mutation(s) within the areas targeted by this assay, and inadequate number of viral copies (<131 copies/mL). A negative result must be combined with clinical observations, patient history, and epidemiological information. The expected result is Negative. Fact Sheet for Patients:  PinkCheek.be Fact Sheet for Healthcare Providers:  GravelBags.it This test is not yet ap proved or cleared by the [place] FDA and  has been authorized for detection and/or diagnosis of SARS-CoV-2 by FDA under an Emergency Use Authorization (EUA). This EUA will remain  in effect (meaning this test can be used) for the duration of the COVID-19 declaration under Section 564(b)(1) of the Act, 21 U.S.C. section 360bbb-3(b)(1), unless the authorization is terminated or revoked sooner.    Influenza A by PCR NEGATIVE NEGATIVE Final   Influenza B by PCR NEGATIVE NEGATIVE Final    Comment: (NOTE) The Xpert Xpress SARS-CoV-2/FLU/RSV assay is intended as an aid in  the diagnosis of influenza from Nasopharyngeal swab specimens and  should not be used as a sole basis for treatment. Nasal washings and  aspirates are  unacceptable for Xpert Xpress SARS-CoV-2/FLU/RSV  testing. Fact Sheet for Patients: PinkCheek.be Fact Sheet for Healthcare Providers: GravelBags.it This test is not yet approved or cleared by the [place] FDA and  has been authorized for detection and/or diagnosis of SARS-CoV-2 by  FDA under an Emergency Use Authorization (EUA). This EUA will remain  in effect (meaning this test can be used) for the duration of the  Covid-19 declaration under Section 564(b)(1) of the Act, 21  U.S.C. section 360bbb-3(b)(1), unless the authorization is  terminated or revoked. Performed at [facility], [address], [place], [place] [address]          Radiology Studies: No results found.      Scheduled Meds: . cholecalciferol  1,000 Units Oral Daily  . dorzolamide-timolol  1 drop Both Eyes BID  . enoxaparin (LOVENOX) injection  40 mg Subcutaneous Q24H  . feeding supplement  1 Container Oral TID BM  . latanoprost  1  drop Both Eyes QHS  . [START ON 12/29/2019] multivitamin with minerals  1 tablet Oral Daily  . pantoprazole  40 mg Oral Daily   Continuous Infusions:   Assessment & Plan:   Principal Problem:   AKI (acute kidney injury) (HCC) Active Problems:   Hypertension   Hypokalemia   GERD (gastroesophageal reflux disease)   Fall   Abdominal pain  AKI: Likely due to prerenal secondary to dehydration and continuation of ACEI Improved We will continue IV fluid - Avoid using renal toxic medications, hypotension and contrast dye (or carefully use) - Hold lisinopril  Hypertension -Continue to hold lisinopril due to AKI metoprolol discontinued as she is having bradycardia and pauses If need to will utilize IV hydralazine and initiate amlodipine in a.m.   Hypokalemia: K= 3.0  on admission. Repleted and now stable Magnesium stable  Bradycardia -up to 3.5-second pause  On beta-blocker and nauseous and  vomiting (vasovagal?) Resolved after stopping metoprolol.    Continue monitoring on telemetry   Vomiting /nausea CT abd no acute abn. S/p EGD on 12/27/2019 found with gastritis, and benign esophageal stenosis Recommended Protonix   GERD (gastroesophageal reflux disease): -protonix  Fall: X-ray of right hip is negative. -f/u CT-head -pt/ot-recommend SNF, but going home with home health -per Percocet for right Hip pain  Abdominal pain: CT abdomen/pelvis negative.  Lipase normal 34. -Supportive care -As needed percocet    DVT prophylaxis: Lovenox Code Status: Full code Family Communication: None at bedside Disposition Plan: Was going to discharge patient today however she vomited and her blood glucose levels was low.  Will monitor overnight and encouraged p.o. intake.  If stable will discharge in a.m.   LOS: 2 days   Time spent: 45 minutes with more than 50% on COC    Lynn Ito, MD [facility] Pager [phone]  If 7PM-7AM, please contact night-coverage [email or web address] Password [facility] 12/28/2019, 1:14 PM Patient ID: Kristen Richard, female   DOB: May 23, 1936, 84 y.o.   MRN: [id number]

## 2019-12-28 NOTE — Anesthesia Postprocedure Evaluation (Signed)
Anesthesia Post Note  Patient: Kristen Richard  Procedure(s) Performed: ESOPHAGOGASTRODUODENOSCOPY (EGD) WITH PROPOFOL (N/A )  Patient location during evaluation: Endoscopy Anesthesia Type: General Level of consciousness: awake and alert Pain management: pain level controlled Vital Signs Assessment: post-procedure vital signs reviewed and stable Respiratory status: spontaneous breathing, nonlabored ventilation, respiratory function stable and patient connected to nasal cannula oxygen Cardiovascular status: blood pressure returned to baseline and stable Postop Assessment: no apparent nausea or vomiting Anesthetic complications: no     Last Vitals:  Vitals:   12/28/19 0807 12/28/19 1209  BP: (!) 146/65 125/74  Pulse: 74 86  Resp:    Temp: 36.7 C 36.5 C  SpO2: 99% 100%    Last Pain:  Vitals:   12/28/19 1459  TempSrc:   PainSc: 4                  Lenard Simmer

## 2019-12-28 NOTE — Care Management Important Message (Signed)
Important Message  Patient Details  Name: Kristen Richard MRN: [id number] Date of Birth: [birth year]   Medicare Important Message Given:  Yes  Initial Medicare IM given by Patient Access Associate on 12/27/2019 at 8:21am.  Still valid.   [name] 12/28/2019, 8:13 AM

## 2019-12-28 NOTE — Progress Notes (Signed)
Physical Therapy Treatment Patient Details Name: Kristen Richard MRN: [id number] DOB: 1936/03/08 Today's Date: 12/28/2019    History of Present Illness Pt is an 84 y.o. female presenting to hospital with generalized weakness, severe fatigue, unable to stand/walk; not eating; smell off past 2 weeks.  Pt admitted with AKI, htn, hypokalemia, abdominal pain, and h/o recent fall.  PMH includes CKD, htn, fall risk, vertigo.    PT Comments    Pt resting in bed upon PT arrival.  Pt reporting blood sugar was low this morning (63) and verbalizing concern regarding what it was now; NT came and re-checked blood sugar (92).  Pt sat up on edge of bed with mod assist but leaking noted by pt's R elbow IV so pt assisted back to bed so therapist able to find nurse to check out IV (charge nurse came to address); once IV addressed, therapist assisted pt with sitting up on edge of bed again.  Pt requiring bed height to be elevated mildly and then pt stood 2x's with min assist up to RW (pt requiring assist for clean-up d/t mild stool incontinence).  Then pt able to take steps bed to recliner with RW CGA (increased effort/time required for pt to take steps but overall steady).  After getting to recliner, pt became nauseas and requiring emesis bag (nurse then present and brought pt nausea medication).  Will continue to focus on strengthening and progressive functional mobility per pt tolerance.   Follow Up Recommendations  SNF     Equipment Recommendations  Rolling walker with 5" wheels;3in1 (PT);Wheelchair (measurements PT);Wheelchair cushion (measurements PT)    Recommendations for Other Services OT consult     Precautions / Restrictions Precautions Precautions: Fall Restrictions Weight Bearing Restrictions: No    Mobility  Bed Mobility Overal bed mobility: Needs Assistance Bed Mobility: Supine to Sit;Sit to Supine     Supine to sit: Mod assist;HOB elevated Sit to supine: Mod assist;HOB elevated   General  bed mobility comments: assist for trunk and B LE's semi-supine to/from sit and then semi-supine to sit; vc's for technique  Transfers Overall transfer level: Needs assistance Equipment used: Rolling walker (2 wheeled) Transfers: Sit to/from Stand Sit to Stand: Min assist;From elevated surface         General transfer comment: x2 trials standing from bed; pt unable to stand on own with bed height at regular height so therapist increased height of bed mildly to improve ability to stand; min assist to initiate and come to full stand  Ambulation/Gait Ambulation/Gait assistance: Min guard Gait Distance (Feet): 3 Feet(bed to recliner) Assistive device: Rolling walker (2 wheeled)   Gait velocity: decreased   General Gait Details: increased time to take steps; decreased B LE step length/foot clearance; steady with RW use   Stairs             Wheelchair Mobility    Modified Rankin (Stroke Patients Only)       Balance Overall balance assessment: Needs assistance Sitting-balance support: Feet supported;No upper extremity supported Sitting balance-Leahy Scale: Good Sitting balance - Comments: steady sitting reaching within BOS   Standing balance support: Bilateral upper extremity supported;During functional activity Standing balance-Leahy Scale: Poor Standing balance comment: pt requiring B UE support on RW for balance with taking steps bed to recliner                            Cognition Arousal/Alertness: Awake/alert Behavior During Therapy: [facility] for tasks assessed/performed  Overall Cognitive Status: Within Functional Limits for tasks assessed                                 General Comments: some general confusion noted requiring increased time and cueing during session      Exercises      General Comments   Nursing cleared pt for participation in physical therapy.  Pt agreeable to PT session.      Pertinent Vitals/Pain Pain Assessment:  Faces Faces Pain Scale: No hurt Pain Location: Pt reports legs are not painful unless touched Pain Intervention(s): Limited activity within patient's tolerance;Monitored during session;Repositioned  HR 93-111 bpm during sessions activities; O2 sats on room air [facility]    Home Living                      Prior Function            PT Goals (current goals can now be found in the care plan section) Acute Rehab PT Goals Patient Stated Goal: to go home PT Goal Formulation: With patient Time For Goal Achievement: 01/09/20 Potential to Achieve Goals: Fair Progress towards PT goals: Progressing toward goals    Frequency    Min 2X/week      PT Plan Current plan remains appropriate    Co-evaluation              AM-PAC PT "6 Clicks" Mobility   Outcome Measure  Help needed turning from your back to your side while in a flat bed without using bedrails?: A Lot Help needed moving from lying on your back to sitting on the side of a flat bed without using bedrails?: A Lot Help needed moving to and from a bed to a chair (including a wheelchair)?: A Lot Help needed standing up from a chair using your arms (e.g., wheelchair or bedside chair)?: A Lot Help needed to walk in hospital room?: Total Help needed climbing 3-5 steps with a railing? : Total 6 Click Score: 10    End of Session Equipment Utilized During Treatment: Gait belt Activity Tolerance: Other (comment)(Limited d/t nausea after sitting down in recliner) Patient left: in chair;with call bell/phone within reach;with chair alarm set;with nursing/sitter in room;Other (comment)(B heels floating via pillow) Nurse Communication: Mobility status;Precautions(pt's IV leaking (R elbow area)--charge nurse came to address d/t unable to contact primary nurse; pt nauseas) PT Visit Diagnosis: Other abnormalities of gait and mobility (R26.89);Muscle weakness (generalized) (M62.81);History of falling (Z91.81);Difficulty in walking, not  elsewhere classified (R26.2)     Time: 7494-4967 PT Time Calculation (min) (ACUTE ONLY): 41 min  Charges:  $Therapeutic Activity: 38-52 mins                   Garey Alleva, PT 12/28/19, 11:24 AM

## 2019-12-28 NOTE — TOC Initial Note (Signed)
Transition of Care [facility]) - Initial/Assessment Note    Patient Details  Name: Kristen Richard MRN: [id number] Date of Birth: 10/23/1936  Transition of Care [facility]) CM/SW Contact:    Maree Krabbe, LCSW Phone Number: 12/28/2019, 10:20 AM  Clinical Narrative:  Pt is alert and oriented. Pt lives at home with her Granddaughter. Pt states Granddaughter is at the house a majority of the day, everyday. Pt states she does not want to go to SNF. Pt states she is agreeable to [facility]. Pt ask that CSW speak to her daughter [name] to determine where pt will go at d/c.   CSW spoke with pt's daughter. Pt's daughter states that pt is not going to go to SNF. Pt's daughter states pt has already told her she will not go to SNF. Pt's daughter did confirm pt's Granddaughter is there with pt majority of everyday day. Pt's daughter is also interested in a private caregiver list. Pt's daughter states she is considering hiring someone for extra help. Pt's daughter is agreeable to home health and would like to use [facility]. Pt's daughter states at home pt has a hospital bed, bedside commode, wheelchair, and walker. No additional equipment is needed.  CSW will set up [place]. MD notified. Potential d/c today.                 Expected Discharge Plan: Home w Home Health Services Barriers to Discharge: No Barriers Identified   Patient Goals and CMS Choice Patient states their goals for this hospitalization and ongoing recovery are:: pt to return home -- per pt   Choice offered to / list presented to : Adult Children  Expected Discharge Plan and Services Expected Discharge Plan: Home w Home Health Services In-house Referral: Clinical Social Work   Post Acute Care Choice: Home Health Living arrangements for the past 2 months: Single Family Home                           HH Arranged: PT, OT, RN HH Agency: [facility] Date [facility] Agency Contacted: 12/28/19 Time HH Agency Contacted: 1019 Representative spoke with at [facility]  Agency: [place]  Prior Living Arrangements/Services Living arrangements for the past 2 months: Single Family Home Lives with:: Relatives(granddaughter) Patient language and need for interpreter reviewed:: Yes Do you feel safe going back to the place where you live?: Yes      Need for Family Participation in Patient Care: Yes (Comment) Care giver support system in place?: Yes (comment)   Criminal Activity/Legal Involvement Pertinent to Current Situation/Hospitalization: No - Comment as needed  Activities of Daily Living Home Assistive Devices/Equipment: Cane (specify quad or straight), Wheelchair, Environmental consultant (specify type) ADL Screening (condition at time of admission) Patient's cognitive ability adequate to safely complete daily activities?: Yes Is the patient deaf or have difficulty hearing?: No Does the patient have difficulty seeing, even when wearing glasses/contacts?: No Does the patient have difficulty concentrating, remembering, or making decisions?: No Patient able to express need for assistance with ADLs?: Yes Does the patient have difficulty dressing or bathing?: Yes Independently performs ADLs?: No Communication: Independent Dressing (OT): Needs assistance Is this a change from baseline?: Pre-admission baseline Grooming: Needs assistance Is this a change from baseline?: Pre-admission baseline Feeding: Independent Bathing: Needs assistance Is this a change from baseline?: Pre-admission baseline Toileting: Dependent Is this a change from baseline?: Pre-admission baseline In/Out Bed: Dependent Is this a change from baseline?: Pre-admission baseline Walks in Home: Independent with  device (comment) Does the patient have difficulty walking or climbing stairs?: Yes Weakness of Legs: Both Weakness of Arms/Hands: Both  Permission Sought/Granted Permission sought to share information with : Family Supports Permission granted to share information with : Yes, Verbal Permission  Granted  Share Information with NAME: [name]  Permission granted to share info w AGENCY: [facility]  Permission granted to share info w Relationship: daughter     Emotional Assessment Appearance:: Appears stated age Attitude/Demeanor/Rapport: Engaged Affect (typically observed): Accepting, Appropriate, Calm Orientation: : Oriented to Self, Oriented to Place, Oriented to  Time, Oriented to Situation Alcohol / Substance Use: Not Applicable Psych Involvement: No (comment)  Admission diagnosis:  Dehydration [E86.0] Hypokalemia [E87.6] Fall [W19.XXXA] Generalized weakness [R53.1] AKI (acute kidney injury) ([place]) [N17.9] Patient Active Problem List   Diagnosis Date Noted  . AKI (acute kidney injury) ([place]) 12/25/2019  . Abdominal pain 12/25/2019  . Hypertension   . Hypokalemia   . GERD (gastroesophageal reflux disease)   . Fall   . Dehydration   . Pressure injury of skin 10/27/2019  . Rhabdomyolysis 10/26/2019   PCP:  [facility] [place]:   [address] [place] [address] Phone: [phone] Fax: [phone]  [id number] - [place], [place] - [address] [place] [address] Phone: [phone] Fax: [phone]     Social Determinants of Health (SDOH) Interventions    Readmission Risk Interventions No flowsheet data found.

## 2019-12-29 DIAGNOSIS — R1110 Vomiting, unspecified: Secondary | ICD-10-CM

## 2019-12-29 LAB — BASIC METABOLIC PANEL
Anion gap: 7 (ref 5–15)
BUN: 15 mg/dL (ref 8–23)
CO2: 30 mmol/L (ref 22–32)
Calcium: 8.3 mg/dL — ABNORMAL LOW (ref 8.9–10.3)
Chloride: 100 mmol/L (ref 98–111)
Creatinine, Ser: 0.77 mg/dL (ref 0.44–1.00)
GFR calc Af Amer: >60 mL/min (ref >60)
GFR calc non Af Amer: >60 mL/min (ref >60)
Glucose, Bld: 107 mg/dL — ABNORMAL HIGH (ref 70–99)
Potassium: 3.4 mmol/L — ABNORMAL LOW (ref 3.5–5.1)
Sodium: 137 mmol/L (ref 135–145)

## 2019-12-29 LAB — GLUCOSE, CAPILLARY: Glucose-Capillary: 96 mg/dL (ref 70–99)

## 2019-12-29 MED ORDER — POTASSIUM CHLORIDE CRYS ER 20 MEQ PO TBCR
40.0000 meq | EXTENDED_RELEASE_TABLET | Freq: Once | ORAL | Status: AC
  Administered 2019-12-29: 40 meq via ORAL
  Filled 2019-12-29: qty 2

## 2019-12-29 MED ORDER — DEXTROSE-NACL 5-0.45 % IV SOLN: INTRAVENOUS | Status: DC

## 2019-12-29 NOTE — Progress Notes (Signed)
Verbal orders from MD to start patient on soft diet.

## 2019-12-29 NOTE — Progress Notes (Signed)
PROGRESS NOTE    Kristen Richard  KYH:[id number] DOB: 1936/11/08 DOA: 12/25/2019 PCP: [facility]    Brief Narrative:  Kristen Richard is a 84 y.o. female with medical history significant of hypertension, GERD, vertigo, fall, who presents with fall, right hip pain, nausea, vomiting, diarrhea. pt was found to have WBC 10.9, lipase 34, Covid PCR negative, CK 43, AKI with creatinine 1.17, BUN 29, temperature 97.5, blood pressure 140/73, tachycardia, oxygen saturation 100% on room air.  CT of abdomen/pelvis is negative.  X-ray of her right hip/pelvis negative.  Patient is placed on MedSurg bed for observation.   Consultants:   none  Procedures: CT  Antimicrobials:   none   Subjective: Patient stated vomited.  No nausea currently.  No abdominal pain Objective: Vitals:   12/28/19 1959 12/29/19 0456 12/29/19 0742 12/29/19 1142  BP: (!) 149/67 (!) 147/86 (!) 152/79 90/62  Pulse: 71 74 79 72  Resp: 18 16 16 16   Temp: 97.8 F (36.6 C) 98 F (36.7 C) 98 F (36.7 C) 97.7 F (36.5 C)  TempSrc: Oral Oral Oral Oral  SpO2: 99% 99% 100% 100%  Weight:      Height:        Intake/Output Summary (Last 24 hours) at 12/29/2019 1244 Last data filed at 12/29/2019 12/31/2019 Gross per 24 hour  Intake 503.66 ml  Output 1250 ml  Net -746.34 ml   Filed Weights   12/25/19 0939 12/25/19 2327 12/27/19 1221  Weight: 87.1 kg 67.8 kg 67.8 kg    Examination:  General exam: Appears calm, sitting in chair, NAD Respiratory system: Clear to auscultation. Respiratory effort normal. Cardiovascular system: S1 & S2 heard, RRR. No  murmurs, rubs, gallops or clicks.  Gastrointestinal system: Abdomen is nondistended, soft and nontender. Normal bowel sounds heard.  No rebound or guarding Central nervous system: Alert and awake Extremities: No edema Skin: Warm dry Psychiatry: . Mood & affect appropriate in current setting.     Data Reviewed: I have personally reviewed following labs and imaging  studies  CBC: Recent Labs  Lab 12/25/19 0959 12/26/19 0452  WBC 10.9* 8.3  NEUTROABS 9.3*  --   HGB 15.1* 12.6  HCT 46.0 38.2  MCV 86.5 86.8  PLT 299 208   Basic Metabolic Panel: Recent Labs  Lab 12/25/19 0959 12/26/19 0452 12/26/19 1802 12/27/19 0415 12/29/19 0423  NA 140 141  --  140 137  K 3.0* 2.8* 3.6 3.7 3.4*  CL 93* 102  --  104 100  CO2 25 25  --  26 30  GLUCOSE 112* 85  --  83 107*  BUN 29* 19  --  19 15  CREATININE 1.17* 0.82  --  0.86 0.77  CALCIUM 10.0 8.7*  --  8.3* 8.3*  MG 1.7 1.8  --   --   --    GFR: Estimated Creatinine Clearance: 51 mL/min (by C-G formula based on SCr of 0.77 mg/dL). Liver Function Tests: Recent Labs  Lab 12/25/19 0959  AST 23  ALT 13  ALKPHOS 60  BILITOT 1.7*  PROT 8.1  ALBUMIN 4.0   Recent Labs  Lab 12/25/19 0959  LIPASE 34   No results for input(s): AMMONIA in the last 168 hours. Coagulation Profile: No results for input(s): INR, PROTIME in the last 168 hours. Cardiac Enzymes: Recent Labs  Lab 12/25/19 0959  CKTOTAL 43   BNP (last 3 results) No results for input(s): PROBNP in the last 8760 hours. HbA1C: No results for input(s):  HGBA1C in the last 72 hours. CBG: Recent Labs  Lab 12/27/19 0804 12/28/19 0808 12/28/19 0836 12/28/19 1003 12/29/19 0748  GLUCAP 74 61* 63* 92 96   Lipid Profile: No results for input(s): CHOL, HDL, LDLCALC, TRIG, CHOLHDL, LDLDIRECT in the last 72 hours. Thyroid Function Tests: No results for input(s): TSH, T4TOTAL, FREET4, T3FREE, THYROIDAB in the last 72 hours. Anemia Panel: No results for input(s): VITAMINB12, FOLATE, FERRITIN, TIBC, IRON, RETICCTPCT in the last 72 hours. Sepsis Labs: No results for input(s): PROCALCITON, LATICACIDVEN in the last 168 hours.  Recent Results (from the past 240 hour(s))  Respiratory Panel by RT PCR (Flu A&B, Covid) - Nasopharyngeal Swab     Status: None   Collection Time: 12/25/19 11:10 AM   Specimen: Nasopharyngeal Swab  Result Value  Ref Range Status   SARS Coronavirus 2 by RT PCR NEGATIVE NEGATIVE Final    Comment: (NOTE) SARS-CoV-2 target nucleic acids are NOT DETECTED. The SARS-CoV-2 RNA is generally detectable in upper respiratoy specimens during the acute phase of infection. The lowest concentration of SARS-CoV-2 viral copies this assay can detect is 131 copies/mL. A negative result does not preclude SARS-Cov-2 infection and should not be used as the sole basis for treatment or other patient management decisions. A negative result may occur with  improper specimen collection/handling, submission of specimen other than nasopharyngeal swab, presence of viral mutation(s) within the areas targeted by this assay, and inadequate number of viral copies (<131 copies/mL). A negative result must be combined with clinical observations, patient history, and epidemiological information. The expected result is Negative. Fact Sheet for Patients:  https://www.moore.com/ Fact Sheet for Healthcare Providers:  https://www.young.biz/ This test is not yet ap proved or cleared by the [place] FDA and  has been authorized for detection and/or diagnosis of SARS-CoV-2 by FDA under an Emergency Use Authorization (EUA). This EUA will remain  in effect (meaning this test can be used) for the duration of the COVID-19 declaration under Section 564(b)(1) of the Act, 21 U.S.C. section 360bbb-3(b)(1), unless the authorization is terminated or revoked sooner.    Influenza A by PCR NEGATIVE NEGATIVE Final   Influenza B by PCR NEGATIVE NEGATIVE Final    Comment: (NOTE) The Xpert Xpress SARS-CoV-2/FLU/RSV assay is intended as an aid in  the diagnosis of influenza from Nasopharyngeal swab specimens and  should not be used as a sole basis for treatment. Nasal washings and  aspirates are unacceptable for Xpert Xpress SARS-CoV-2/FLU/RSV  testing. Fact Sheet for  Patients: https://www.moore.com/ Fact Sheet for Healthcare Providers: https://www.young.biz/ This test is not yet approved or cleared by the [place] FDA and  has been authorized for detection and/or diagnosis of SARS-CoV-2 by  FDA under an Emergency Use Authorization (EUA). This EUA will remain  in effect (meaning this test can be used) for the duration of the  Covid-19 declaration under Section 564(b)(1) of the Act, 21  U.S.C. section 360bbb-3(b)(1), unless the authorization is  terminated or revoked. Performed at [facility], [address], [place], [place] [address]          Radiology Studies: No results found.      Scheduled Meds: . cholecalciferol  1,000 Units Oral Daily  . dorzolamide-timolol  1 drop Both Eyes BID  . enoxaparin (LOVENOX) injection  40 mg Subcutaneous Q24H  . feeding supplement  1 Container Oral TID BM  . latanoprost  1 drop Both Eyes QHS  . multivitamin with minerals  1 tablet Oral Daily  . pantoprazole  40 mg Oral Daily   Continuous Infusions: . dextrose 5 % and 0.45% NaCl 50 mL/hr at 12/28/19 1649  . dextrose 5 % and 0.45% NaCl      Assessment & Plan:   Principal Problem:   AKI (acute kidney injury) ([place]) Active Problems:   Hypertension   Hypokalemia   GERD (gastroesophageal reflux disease)   Fall   Abdominal pain  AKI: Likely due to prerenal secondary to dehydration and continuation of ACEI Improved We will continue IV fluid - Avoid using renal toxic medications, hypotension and contrast dye (or carefully use) - Hold lisinopril  Hypertension -Continue to hold lisinopril due to AKI metoprolol discontinued as she is having bradycardia and pauses If need to will utilize IV hydralazine and initiate amlodipine in a.m.   Hypokalemia: K= 3.0  on admission. Repleted and now stable Magnesium stable  Bradycardia -up to 3.5-second pause  On beta-blocker and nauseous and  vomiting (vasovagal?) Resolved after stopping metoprolol.    Continue monitoring on telemetry   Vomiting /nausea CT abd no acute abn. S/p EGD on 12/27/2019 found with gastritis, and benign esophageal stenosis Recommended Protonix GI following-recommend HIDA if ongoing symptoms, will order N.p.o. start D5 half for HIDA   GERD (gastroesophageal reflux disease): -protonix  Fall: X-ray of right hip is negative. -f/u CT-head -pt/ot-recommend SNF, but going home with home health -per Percocet for right Hip pain  Abdominal pain: CT abdomen/pelvis negative.  Lipase normal 34. -Supportive care -As needed percocet    DVT prophylaxis: Lovenox Code Status: Full code Family Communication: None at bedside Disposition Plan: Was going to discharge patient today however she vomited will need to obtain HIDA for further evaluation.    LOS: 3 days   Time spent: 45 minutes with more than 50% on [place], MD [facility] Pager [phone]  If 7PM-7AM, please contact night-coverage [email or web address] Password [facility] 12/29/2019, 12:44 PM Patient ID: Vonzell Schlatter, female   DOB: May 30, 1936, 84 y.o.   MRN: [id number]

## 2019-12-29 NOTE — Plan of Care (Signed)
  Problem: Education: Goal: Knowledge of General Education information will improve Description: Including pain rating scale, medication(s)/side effects and non-pharmacologic comfort measures Outcome: Progressing   Problem: Safety: Goal: Ability to remain free from injury will improve Outcome: Progressing   

## 2019-12-29 NOTE — Progress Notes (Signed)
Lucilla Lame, MD [facility]   [address], [place], [place] [address] Phone: [phone] Fax : [phone]   Subjective: This patient had an EGD yesterday with gastritis and a stricture seen in the esophagus.  The patient reports that she has had problems with nausea all the way back to when she was in the nursing home and they would bring a big plate of food to her and she would smell it and start getting nauseous.  The patient also reports that she has issues with liquids just as much as solids.  She had a CT scan of the abdomen pelvis which did not show any abnormalities.  The patient does have a history of reflux and vertigo.   Objective: Vital signs in last 24 hours: Vitals:   12/28/19 1959 12/29/19 0456 12/29/19 0742 12/29/19 1142  BP: (!) 149/67 (!) 147/86 (!) 152/79 90/62  Pulse: 71 74 79 72  Resp: 18 16 16 16   Temp: 97.8 F (36.6 C) 98 F (36.7 C) 98 F (36.7 C) 97.7 F (36.5 C)  TempSrc: Oral Oral Oral Oral  SpO2: 99% 99% 100% 100%  Weight:      Height:       Weight change:   Intake/Output Summary (Last 24 hours) at 12/29/2019 1406 Last data filed at 12/29/2019 5176 Gross per 24 hour  Intake 503.66 ml  Output 1250 ml  Net -746.34 ml     Exam: Heart:: Regular rate and rhythm, S1S2 present or without murmur or extra heart sounds Lungs: normal and clear to auscultation and percussion Abdomen: soft, nontender, normal bowel sounds   Lab Results: @LABTEST2 @ Micro Results: Recent Results (from the past 240 hour(s))  Respiratory Panel by RT PCR (Flu A&B, Covid) - Nasopharyngeal Swab     Status: None   Collection Time: 12/25/19 11:10 AM   Specimen: Nasopharyngeal Swab  Result Value Ref Range Status   SARS Coronavirus 2 by RT PCR NEGATIVE NEGATIVE Final    Comment: (NOTE) SARS-CoV-2 target nucleic acids are NOT DETECTED. The SARS-CoV-2 RNA is generally detectable in upper respiratoy specimens during the acute phase of infection. The lowest concentration  of SARS-CoV-2 viral copies this assay can detect is 131 copies/mL. A negative result does not preclude SARS-Cov-2 infection and should not be used as the sole basis for treatment or other patient management decisions. A negative result may occur with  improper specimen collection/handling, submission of specimen other than nasopharyngeal swab, presence of viral mutation(s) within the areas targeted by this assay, and inadequate number of viral copies (<131 copies/mL). A negative result must be combined with clinical observations, patient history, and epidemiological information. The expected result is Negative. Fact Sheet for Patients:  PinkCheek.be Fact Sheet for Healthcare Providers:  GravelBags.it This test is not yet ap proved or cleared by the [place] FDA and  has been authorized for detection and/or diagnosis of SARS-CoV-2 by FDA under an Emergency Use Authorization (EUA). This EUA will remain  in effect (meaning this test can be used) for the duration of the COVID-19 declaration under Section 564(b)(1) of the Act, 21 U.S.C. section 360bbb-3(b)(1), unless the authorization is terminated or revoked sooner.    Influenza A by PCR NEGATIVE NEGATIVE Final   Influenza B by PCR NEGATIVE NEGATIVE Final    Comment: (NOTE) The Xpert Xpress SARS-CoV-2/FLU/RSV assay is intended as an aid in  the diagnosis of influenza from Nasopharyngeal swab specimens and  should not be used as a sole basis for treatment. Nasal washings  and  aspirates are unacceptable for Xpert Xpress SARS-CoV-2/FLU/RSV  testing. Fact Sheet for Patients: https://www.moore.com/ Fact Sheet for Healthcare Providers: https://www.young.biz/ This test is not yet approved or cleared by the [place] FDA and  has been authorized for detection and/or diagnosis of SARS-CoV-2 by  FDA under an Emergency Use Authorization (EUA).  This EUA will remain  in effect (meaning this test can be used) for the duration of the  Covid-19 declaration under Section 564(b)(1) of the Act, 21  U.S.C. section 360bbb-3(b)(1), unless the authorization is  terminated or revoked. Performed at [facility], [address], [place], [place] [address]    Studies/Results: No results found. Medications: I have reviewed the patient's current medications. Scheduled Meds: . cholecalciferol  1,000 Units Oral Daily  . dorzolamide-timolol  1 drop Both Eyes BID  . enoxaparin (LOVENOX) injection  40 mg Subcutaneous Q24H  . feeding supplement  1 Container Oral TID BM  . latanoprost  1 drop Both Eyes QHS  . multivitamin with minerals  1 tablet Oral Daily  . pantoprazole  40 mg Oral Daily   Continuous Infusions: . dextrose 5 % and 0.45% NaCl 50 mL/hr at 12/28/19 1649  . dextrose 5 % and 0.45% NaCl     PRN Meds:.acetaminophen, hydrALAZINE, meclizine, ondansetron (ZOFRAN) IV, oxyCODONE-acetaminophen, polyethylene glycol   Assessment: Principal Problem:   AKI (acute kidney injury) ([facility]) Active Problems:   Hypertension   Hypokalemia   GERD (gastroesophageal reflux disease)   Fall   Abdominal pain    Plan: This patient had an upper endoscopy without any cause to explain her nausea with liquids solids and having nausea with smelling food.  Dr. Tobi Bastos had suggested getting a HIDA scan to make sure there is nothing going on with the patient's gallbladder.  I do not disagree with this plan.  As for a GI cause of the symptoms I do not see any pathology pointing directly to something that would explain all of this patient's symptoms.  If she continues to have dysphagia and is not on any anticoagulation then I am not opposed to repeating the upper endoscopy with dilation of the esophagus which may help with some dysphagia to solids such as chicken steak bread and beef but unlikely to help with the patient's nausea or troubles with liquids and  smells.  The patient has been explained the plan agrees with it.   LOS: 3 days   Midge Minium 12/29/2019, 2:06 PM Pager [phone] 7am-5pm  Check AMION for 5pm -7am coverage and on weekends

## 2019-12-29 NOTE — Plan of Care (Signed)
  Problem: Education: Goal: Knowledge of General Education information will improve Description: Including pain rating scale, medication(s)/side effects and non-pharmacologic comfort measures Outcome: Progressing   Problem: Pain Managment: Goal: General experience of comfort will improve Outcome: Progressing   Problem: Safety: Goal: Ability to remain free from injury will improve Outcome: Progressing   

## 2019-12-30 LAB — BASIC METABOLIC PANEL
Anion gap: 9 (ref 5–15)
BUN: 11 mg/dL (ref 8–23)
CO2: 30 mmol/L (ref 22–32)
Calcium: 8.5 mg/dL — ABNORMAL LOW (ref 8.9–10.3)
Chloride: 99 mmol/L (ref 98–111)
Creatinine, Ser: 0.78 mg/dL (ref 0.44–1.00)
GFR calc Af Amer: >60 mL/min (ref >60)
GFR calc non Af Amer: >60 mL/min (ref >60)
Glucose, Bld: 101 mg/dL — ABNORMAL HIGH (ref 70–99)
Potassium: 3 mmol/L — ABNORMAL LOW (ref 3.5–5.1)
Sodium: 138 mmol/L (ref 135–145)

## 2019-12-30 LAB — GLUCOSE, CAPILLARY: Glucose-Capillary: 98 mg/dL (ref 70–99)

## 2019-12-30 MED ORDER — POTASSIUM CHLORIDE CRYS ER 20 MEQ PO TBCR
40.0000 meq | EXTENDED_RELEASE_TABLET | Freq: Two times a day (BID) | ORAL | Status: DC
  Administered 2019-12-30 – 2019-12-31 (×4): 40 meq via ORAL
  Filled 2019-12-30 (×4): qty 2

## 2019-12-30 NOTE — Progress Notes (Signed)
Lucilla Lame, MD [facility]   [address], [place], [place] [address] Phone: [phone] Fax : [phone]   Subjective: The patient reports that she ate pancakes this morning without any symptoms.  She is not having any nausea or vomiting so far today.  She is getting around with physical therapy today.  There is no report of any abdominal pain.   Objective: Vital signs in last 24 hours: Vitals:   12/29/19 1957 12/30/19 0426 12/30/19 0728 12/30/19 1119  BP: 124/67 (!) 143/78 (!) 152/74 137/71  Pulse: 73 80 80 90  Resp: 20 20 18 19   Temp: 98.4 F (36.9 C) 98 F (36.7 C) 98 F (36.7 C) 98.1 F (36.7 C)  TempSrc: Oral Oral Oral Oral  SpO2: 98% 100% 99% 98%  Weight:  67.5 kg    Height:       Weight change:   Intake/Output Summary (Last 24 hours) at 12/30/2019 1158 Last data filed at 12/30/2019 1038 Gross per 24 hour  Intake 369.36 ml  Output 1950 ml  Net -1580.64 ml     Exam: Heart:: Regular rate and rhythm, S1S2 present or without murmur or extra heart sounds Lungs: normal and clear to auscultation and percussion Abdomen: soft, nontender, normal bowel sounds   Lab Results: @LABTEST2 @ Micro Results: Recent Results (from the past 240 hour(s))  Respiratory Panel by RT PCR (Flu A&B, Covid) - Nasopharyngeal Swab     Status: None   Collection Time: 12/25/19 11:10 AM   Specimen: Nasopharyngeal Swab  Result Value Ref Range Status   SARS Coronavirus 2 by RT PCR NEGATIVE NEGATIVE Final    Comment: (NOTE) SARS-CoV-2 target nucleic acids are NOT DETECTED. The SARS-CoV-2 RNA is generally detectable in upper respiratoy specimens during the acute phase of infection. The lowest concentration of SARS-CoV-2 viral copies this assay can detect is 131 copies/mL. A negative result does not preclude SARS-Cov-2 infection and should not be used as the sole basis for treatment or other patient management decisions. A negative result may occur with  improper specimen  collection/handling, submission of specimen other than nasopharyngeal swab, presence of viral mutation(s) within the areas targeted by this assay, and inadequate number of viral copies (<131 copies/mL). A negative result must be combined with clinical observations, patient history, and epidemiological information. The expected result is Negative. Fact Sheet for Patients:  PinkCheek.be Fact Sheet for Healthcare Providers:  GravelBags.it This test is not yet ap proved or cleared by the [place] FDA and  has been authorized for detection and/or diagnosis of SARS-CoV-2 by FDA under an Emergency Use Authorization (EUA). This EUA will remain  in effect (meaning this test can be used) for the duration of the COVID-19 declaration under Section 564(b)(1) of the Act, 21 U.S.C. section 360bbb-3(b)(1), unless the authorization is terminated or revoked sooner.    Influenza A by PCR NEGATIVE NEGATIVE Final   Influenza B by PCR NEGATIVE NEGATIVE Final    Comment: (NOTE) The Xpert Xpress SARS-CoV-2/FLU/RSV assay is intended as an aid in  the diagnosis of influenza from Nasopharyngeal swab specimens and  should not be used as a sole basis for treatment. Nasal washings and  aspirates are unacceptable for Xpert Xpress SARS-CoV-2/FLU/RSV  testing. Fact Sheet for Patients: PinkCheek.be Fact Sheet for Healthcare Providers: GravelBags.it This test is not yet approved or cleared by the [place] FDA and  has been authorized for detection and/or diagnosis of SARS-CoV-2 by  FDA under an Emergency Use Authorization (EUA). This EUA will remain  in effect (meaning this test can be used) for the duration of the  Covid-19 declaration under Section 564(b)(1) of the Act, 21  U.S.C. section 360bbb-3(b)(1), unless the authorization is  terminated or revoked. Performed at [facility],  [address], [place], [place] [address]    Studies/Results: No results found. Medications: I have reviewed the patient's current medications. Scheduled Meds: . cholecalciferol  1,000 Units Oral Daily  . dorzolamide-timolol  1 drop Both Eyes BID  . enoxaparin (LOVENOX) injection  40 mg Subcutaneous Q24H  . feeding supplement  1 Container Oral TID BM  . latanoprost  1 drop Both Eyes QHS  . multivitamin with minerals  1 tablet Oral Daily  . pantoprazole  40 mg Oral Daily  . potassium chloride  40 mEq Oral BID   Continuous Infusions: . dextrose 5 % and 0.45% NaCl 50 mL/hr at 12/29/19 2055  . dextrose 5 % and 0.45% NaCl     PRN Meds:.acetaminophen, hydrALAZINE, meclizine, ondansetron (ZOFRAN) IV, oxyCODONE-acetaminophen, polyethylene glycol   Assessment: Principal Problem:   AKI (acute kidney injury) (HCC) Active Problems:   Hypertension   Hypokalemia   GERD (gastroesophageal reflux disease)   Fall   Abdominal pain   Intractable vomiting    Plan: This patient came with nausea vomiting diarrhea and is doing much better without any abdominal pain.  She also has no nausea this morning and tolerated her breakfast.  The patient is supposed to have her HIDA scan tomorrow to make sure that her gallbladder is functioning well.  There is no sign of any cause for her symptoms on the upper endoscopy.  Dr. Allegra Lai will be taking over the service tomorrow and will follow up with the patient.   LOS: 4 days   Midge Minium 12/30/2019, 11:58 AM Pager [phone] 7am-5pm  Check AMION for 5pm -7am coverage and on weekends

## 2019-12-30 NOTE — Progress Notes (Signed)
PROGRESS NOTE    Kristen Richard  RJJ:[id number] DOB: [birth year] DOA: 12/25/2019 PCP: [facility]    Brief Narrative:  Kristen Richard is a 84 y.o. female with medical history significant of hypertension, GERD, vertigo, fall, who presents with fall, right hip pain, nausea, vomiting, diarrhea. pt was found to have WBC 10.9, lipase 34, Covid PCR negative, CK 43, AKI with creatinine 1.17, BUN 29, temperature 97.5, blood pressure 140/73, tachycardia, oxygen saturation 100% on room air.  CT of abdomen/pelvis is negative.  X-ray of her right hip/pelvis negative.  Patient is placed on MedSurg bed for observation.   Consultants:   none  Procedures: CT  Antimicrobials:   none   Subjective: Pancakes earlier.  So far no nausea or vomiting.  No new complaints  Objective: Vitals:   12/29/19 1957 12/30/19 0426 12/30/19 0728 12/30/19 1119  BP: 124/67 (!) 143/78 (!) 152/74 137/71  Pulse: 73 80 80 90  Resp: 20 20 18 19   Temp: 98.4 F (36.9 C) 98 F (36.7 C) 98 F (36.7 C) 98.1 F (36.7 C)  TempSrc: Oral Oral Oral Oral  SpO2: 98% 100% 99% 98%  Weight:  67.5 kg    Height:        Intake/Output Summary (Last 24 hours) at 12/30/2019 1141 Last data filed at 12/30/2019 1038 Gross per 24 hour  Intake 369.36 ml  Output 1950 ml  Net -1580.64 ml   Filed Weights   12/25/19 2327 12/27/19 1221 12/30/19 0426  Weight: 67.8 kg 67.8 kg 67.5 kg    Examination:  General exam: Appears calm, sitting in chair, NAD Respiratory system: Clear to auscultation. Respiratory effort normal.no w/r/r Cardiovascular system: S1 & S2 heard, RRR. No  murmurs, rubs, gallops or clicks.  Gastrointestinal system: Abdomen is nondistended, soft and nontender. Normal bowel sounds heard.  No rebound or guarding Central nervous system: Alert and awake Extremities: No edema Skin: Warm dry Psychiatry: . Mood & affect appropriate in current setting.     Data Reviewed: I have personally reviewed following  labs and imaging studies  CBC: Recent Labs  Lab 12/25/19 0959 12/26/19 0452  WBC 10.9* 8.3  NEUTROABS 9.3*  --   HGB 15.1* 12.6  HCT 46.0 38.2  MCV 86.5 86.8  PLT 299 208   Basic Metabolic Panel: Recent Labs  Lab 12/25/19 0959 12/25/19 0959 12/26/19 0452 12/26/19 1802 12/27/19 0415 12/29/19 0423 12/30/19 0434  NA 140  --  141  --  140 137 138  K 3.0*   < > 2.8* 3.6 3.7 3.4* 3.0*  CL 93*  --  102  --  104 100 99  CO2 25  --  25  --  26 30 30   GLUCOSE 112*  --  85  --  83 107* 101*  BUN 29*  --  19  --  19 15 11   CREATININE 1.17*  --  0.82  --  0.86 0.77 0.78  CALCIUM 10.0  --  8.7*  --  8.3* 8.3* 8.5*  MG 1.7  --  1.8  --   --   --   --    < > = values in this interval not displayed.   GFR: Estimated Creatinine Clearance: 50.8 mL/min (by C-G formula based on SCr of 0.78 mg/dL). Liver Function Tests: Recent Labs  Lab 12/25/19 0959  AST 23  ALT 13  ALKPHOS 60  BILITOT 1.7*  PROT 8.1  ALBUMIN 4.0   Recent Labs  Lab 12/25/19 0959  LIPASE  34   No results for input(s): AMMONIA in the last 168 hours. Coagulation Profile: No results for input(s): INR, PROTIME in the last 168 hours. Cardiac Enzymes: Recent Labs  Lab 12/25/19 0959  CKTOTAL 43   BNP (last 3 results) No results for input(s): PROBNP in the last 8760 hours. HbA1C: No results for input(s): HGBA1C in the last 72 hours. CBG: Recent Labs  Lab 12/28/19 0808 12/28/19 0836 12/28/19 1003 12/29/19 0748 12/30/19 0730  GLUCAP 61* 63* 92 96 98   Lipid Profile: No results for input(s): CHOL, HDL, LDLCALC, TRIG, CHOLHDL, LDLDIRECT in the last 72 hours. Thyroid Function Tests: No results for input(s): TSH, T4TOTAL, FREET4, T3FREE, THYROIDAB in the last 72 hours. Anemia Panel: No results for input(s): VITAMINB12, FOLATE, FERRITIN, TIBC, IRON, RETICCTPCT in the last 72 hours. Sepsis Labs: No results for input(s): PROCALCITON, LATICACIDVEN in the last 168 hours.  Recent Results (from the past 240  hour(s))  Respiratory Panel by RT PCR (Flu A&B, Covid) - Nasopharyngeal Swab     Status: None   Collection Time: 12/25/19 11:10 AM   Specimen: Nasopharyngeal Swab  Result Value Ref Range Status   SARS Coronavirus 2 by RT PCR NEGATIVE NEGATIVE Final    Comment: (NOTE) SARS-CoV-2 target nucleic acids are NOT DETECTED. The SARS-CoV-2 RNA is generally detectable in upper respiratoy specimens during the acute phase of infection. The lowest concentration of SARS-CoV-2 viral copies this assay can detect is 131 copies/mL. A negative result does not preclude SARS-Cov-2 infection and should not be used as the sole basis for treatment or other patient management decisions. A negative result may occur with  improper specimen collection/handling, submission of specimen other than nasopharyngeal swab, presence of viral mutation(s) within the areas targeted by this assay, and inadequate number of viral copies (<131 copies/mL). A negative result must be combined with clinical observations, patient history, and epidemiological information. The expected result is Negative. Fact Sheet for Patients:  https://www.moore.com/ Fact Sheet for Healthcare Providers:  https://www.young.biz/ This test is not yet ap proved or cleared by the [place] FDA and  has been authorized for detection and/or diagnosis of SARS-CoV-2 by FDA under an Emergency Use Authorization (EUA). This EUA will remain  in effect (meaning this test can be used) for the duration of the COVID-19 declaration under Section 564(b)(1) of the Act, 21 U.S.C. section 360bbb-3(b)(1), unless the authorization is terminated or revoked sooner.    Influenza A by PCR NEGATIVE NEGATIVE Final   Influenza B by PCR NEGATIVE NEGATIVE Final    Comment: (NOTE) The Xpert Xpress SARS-CoV-2/FLU/RSV assay is intended as an aid in  the diagnosis of influenza from Nasopharyngeal swab specimens and  should not be used as  a sole basis for treatment. Nasal washings and  aspirates are unacceptable for Xpert Xpress SARS-CoV-2/FLU/RSV  testing. Fact Sheet for Patients: https://www.moore.com/ Fact Sheet for Healthcare Providers: https://www.young.biz/ This test is not yet approved or cleared by the [place] FDA and  has been authorized for detection and/or diagnosis of SARS-CoV-2 by  FDA under an Emergency Use Authorization (EUA). This EUA will remain  in effect (meaning this test can be used) for the duration of the  Covid-19 declaration under Section 564(b)(1) of the Act, 21  U.S.C. section 360bbb-3(b)(1), unless the authorization is  terminated or revoked. Performed at [facility], [address], [place], [place] [address]          Radiology Studies: No results found.      Scheduled Meds: .  cholecalciferol  1,000 Units Oral Daily  . dorzolamide-timolol  1 drop Both Eyes BID  . enoxaparin (LOVENOX) injection  40 mg Subcutaneous Q24H  . feeding supplement  1 Container Oral TID BM  . latanoprost  1 drop Both Eyes QHS  . multivitamin with minerals  1 tablet Oral Daily  . pantoprazole  40 mg Oral Daily   Continuous Infusions: . dextrose 5 % and 0.45% NaCl 50 mL/hr at 12/29/19 2055  . dextrose 5 % and 0.45% NaCl      Assessment & Plan:   Principal Problem:   AKI (acute kidney injury) ([place]) Active Problems:   Hypertension   Hypokalemia   GERD (gastroesophageal reflux disease)   Fall   Abdominal pain   Intractable vomiting  AKI: Likely due to prerenal secondary to dehydration and continuation of ACEI Improved We will continue IV fluid - Avoid using renal toxic medications, hypotension and contrast dye (or carefully use) - Hold lisinopril  Hypertension -Continue to hold lisinopril due to AKI metoprolol discontinued as she is having bradycardia and pauses If need to will utilize IV hydralazine and initiate amlodipine in  a.m.   Hypokalemia: K= 3.0   today Likely from vomiting We will give KCl 40 mEq p.o. twice daily x1 day Magnesium stable  Bradycardia -up to 3.5-second pause  On beta-blocker and nauseous and vomiting (vasovagal?) Resolved after stopping metoprolol.    Continue monitoring on telemetry   Vomiting /nausea CT abd no acute abn. S/p EGD on 12/27/2019 found with gastritis, and benign esophageal stenosis Recommended Protonix GI following-recommend HIDA if ongoing symptoms, will order Plan: HIDA in am.   GERD (gastroesophageal reflux disease): -protonix  Fall: X-ray of right hip is negative. -f/u CT-head -pt/ot-recommend SNF, but going home with home health -per Percocet for right Hip pain  Abdominal pain: CT abdomen/pelvis negative.  Lipase normal 34. -Supportive care -As needed percocet    DVT prophylaxis: Lovenox Code Status: Full code Family Communication: None at bedside Disposition Plan: Still not tolerating PO intake. Plan for HIDA in am for further evaluation.    LOS: 4 days   Time spent: 45 minutes with more than 50% on [place], MD [facility] Pager [phone]  If 7PM-7AM, please contact night-coverage [email or web address] Password [email or web address] 12/30/2019, 11:41 AM Patient ID: Kristen Richard, female   DOB: 06-10-1936, 84 y.o.   MRN: [id number]

## 2019-12-30 NOTE — Progress Notes (Signed)
Physical Therapy Treatment Patient Details Name: Kristen Richard MRN: [id number] DOB: [birth year] Today's Date: 12/30/2019    History of Present Illness Pt is an 84 y.o. female presenting to hospital with generalized weakness, severe fatigue, unable to stand/walk; not eating; smell off past 2 weeks.  Pt admitted with AKI, htn, hypokalemia, abdominal pain, and h/o recent fall.  PMH includes CKD, htn, fall risk, vertigo.    PT Comments    Pt ready for session.  Pt with good effort and HOB raised and only needs light assist and increased time to get to EOB today.  Once sitting, generally steady.  Stands to RW with min assist.  She is able to transfer to recliner with increased time , RW and min assist.  After seated rest and MD visit, she is able to stand and walk 8' with RW and min assist with daughter bringing recliner.  She takes short shuffling steps and is overall weak and needs +1 assist but does not buckle or have LOB's.    While SNF remains appropriate for rehab, family wishes to take pt home.  They have all equipment needed at home with family caregivers.  They wish to have HHPT resume once discharged and daughter is asking about Home health nursing services.  I told her I was unsure of insurance coverage for nursing and encouraged her to discuss with care management regarding insurance vs private pay.     Follow Up Recommendations  SNF;Other (comment)  Family wishes to take pt home.     Equipment Recommendations  (daughter stated they have all equipment needed)    Recommendations for Other Services       Precautions / Restrictions Precautions Precautions: Fall Restrictions Weight Bearing Restrictions: No    Mobility  Bed Mobility Overal bed mobility: Needs Assistance       Supine to sit: Min assist     General bed mobility comments: good effort with HOB raised.  Only light assit to get fully upright.  Transfers Overall transfer level: Needs assistance Equipment used:  Rolling walker (2 wheeled) Transfers: Sit to/from Stand Sit to Stand: Min assist         General transfer comment: verbal cues for hand placements.  Ambulation/Gait Ambulation/Gait assistance: Min guard Gait Distance (Feet): 8 Feet Assistive device: Rolling walker (2 wheeled) Gait Pattern/deviations: Step-to pattern;Decreased step length - left;Decreased stance time - right;Trunk flexed Gait velocity: decreased   General Gait Details: slow step to gait with encouragement to increase distance.  fatigues quickly but no buckling or LOB ntoed today.  daugheter follows with recliner   Stairs             Wheelchair Mobility    Modified Rankin (Stroke Patients Only)       Balance Overall balance assessment: Needs assistance Sitting-balance support: Feet supported;No upper extremity supported Sitting balance-Leahy Scale: Good     Standing balance support: Bilateral upper extremity supported;During functional activity Standing balance-Leahy Scale: Poor Standing balance comment: pt requiring B UE support on RW for balance with taking steps                            Cognition Arousal/Alertness: Awake/alert Behavior During Therapy: WFL for tasks assessed/performed Overall Cognitive Status: Within Functional Limits for tasks assessed  Exercises      General Comments        Pertinent Vitals/Pain Pain Assessment: No/denies pain    Home Living                      Prior Function            PT Goals (current goals can now be found in the care plan section) Progress towards PT goals: Progressing toward goals    Frequency    Min 2X/week      PT Plan Current plan remains appropriate    Co-evaluation              AM-PAC PT "6 Clicks" Mobility   Outcome Measure  Help needed turning from your back to your side while in a flat bed without using bedrails?: A Little Help  needed moving from lying on your back to sitting on the side of a flat bed without using bedrails?: A Little Help needed moving to and from a bed to a chair (including a wheelchair)?: A Little Help needed standing up from a chair using your arms (e.g., wheelchair or bedside chair)?: A Little Help needed to walk in hospital room?: A Little Help needed climbing 3-5 steps with a railing? : A Lot 6 Click Score: 17    End of Session Equipment Utilized During Treatment: Gait belt Activity Tolerance: Patient tolerated treatment well;Patient limited by fatigue Patient left: in chair;with call bell/phone within reach;with chair alarm set;with family/visitor present Nurse Communication: Mobility status       Time: 4580-9983 PT Time Calculation (min) (ACUTE ONLY): 23 min  Charges:  $Gait Training: 8-22 mins $Therapeutic Activity: 8-22 mins                    Chesley Noon, PTA 12/30/19, 12:18 PM

## 2019-12-31 ENCOUNTER — Inpatient Hospital Stay: Payer: Medicare HMO

## 2019-12-31 DIAGNOSIS — R112 Nausea with vomiting, unspecified: Secondary | ICD-10-CM

## 2019-12-31 LAB — GLUCOSE, CAPILLARY: Glucose-Capillary: 89 mg/dL (ref 70–99)

## 2019-12-31 LAB — POTASSIUM: Potassium: 4.1 mmol/L (ref 3.5–5.1)

## 2019-12-31 MED ORDER — MORPHINE SULFATE (PF) 2 MG/ML IV SOLN
2.0000 mg | Freq: Once | INTRAVENOUS | Status: AC
  Administered 2019-12-31: 14:00:00 2 mg via INTRAVENOUS
  Filled 2019-12-31: qty 1

## 2019-12-31 MED ORDER — TECHNETIUM TC 99M MEBROFENIN IV KIT
5.0000 | PACK | Freq: Once | INTRAVENOUS | Status: AC | PRN
  Administered 2019-12-31: 4.927 via INTRAVENOUS

## 2019-12-31 NOTE — Progress Notes (Signed)
Patient going to HIDA scan around 1230 so she has been NPO. Agreed with patient to take medications after the scan because she usually drinks a lot of water with her pills.  Will give meds after scan.

## 2019-12-31 NOTE — Plan of Care (Signed)

## 2019-12-31 NOTE — Progress Notes (Signed)
Arlyss Repress, MD [address]  [address]  [place], [place] [address]  Main: [phone]  Fax: [phone] Pager: [phone]   Subjective: Patient returned from HIDA scan.  She reports that she has been tolerating diet well since yesterday.  She denies abdominal pain, nausea or vomiting.  She does not have any GI complaints today  Objective: Vital signs in last 24 hours: Vitals:   12/31/19 0428 12/31/19 0748 12/31/19 1123 12/31/19 1516  BP: 107/70 110/75 133/67 136/66  Pulse: 88 92 88 84  Resp:  19 18 18   Temp: 98.5 F (36.9 C) 98.1 F (36.7 C) 98.3 F (36.8 C) 97.6 F (36.4 C)  TempSrc: Oral Oral Oral Oral  SpO2: 99% 98% 97% 98%  Weight:      Height:       Weight change:   Intake/Output Summary (Last 24 hours) at 12/31/2019 1816 Last data filed at 12/31/2019 1525 Gross per 24 hour  Intake 595.1 ml  Output 1250 ml  Net -654.9 ml     Exam: Heart:: Regular rate and rhythm, S1S2 present or without murmur or extra heart sounds Lungs: normal and clear to auscultation Abdomen: soft, nontender, normal bowel sounds   Lab Results: CBC Latest Ref Rng & Units 12/26/2019 12/25/2019 10/27/2019  WBC 4.0 - 10.5 K/uL 8.3 10.9(H) 10.5  Hemoglobin 12.0 - 15.0 g/dL 10/29/2019 15.1(H) 10.7(L)  Hematocrit 36.0 - 46.0 % 38.2 46.0 32.8(L)  Platelets 150 - 400 K/uL 208 299 278   CMP Latest Ref Rng & Units 12/31/2019 12/30/2019 12/29/2019  Glucose 70 - 99 mg/dL - 12/31/2019) 371(I)  BUN 8 - 23 mg/dL - 11 15  Creatinine 967(E - 1.00 mg/dL - 9.38 1.01  Sodium 7.51 - 145 mmol/L - 138 137  Potassium 3.5 - 5.1 mmol/L 4.1 3.0(L) 3.4(L)  Chloride 98 - 111 mmol/L - 99 100  CO2 22 - 32 mmol/L - 30 30  Calcium 8.9 - 10.3 mg/dL - 8.5(L) 8.3(L)  Total Protein 6.5 - 8.1 g/dL - - -  Total Bilirubin 0.3 - 1.2 mg/dL - - -  Alkaline Phos 38 - 126 U/L - - -  AST 15 - 41 U/L - - -  ALT 0 - 44 U/L - - -    Micro Results: Recent Results (from the past 240 hour(s))  Respiratory Panel by RT PCR  (Flu A&B, Covid) - Nasopharyngeal Swab     Status: None   Collection Time: 12/25/19 11:10 AM   Specimen: Nasopharyngeal Swab  Result Value Ref Range Status   SARS Coronavirus 2 by RT PCR NEGATIVE NEGATIVE Final    Comment: (NOTE) SARS-CoV-2 target nucleic acids are NOT DETECTED. The SARS-CoV-2 RNA is generally detectable in upper respiratoy specimens during the acute phase of infection. The lowest concentration of SARS-CoV-2 viral copies this assay can detect is 131 copies/mL. A negative result does not preclude SARS-Cov-2 infection and should not be used as the sole basis for treatment or other patient management decisions. A negative result may occur with  improper specimen collection/handling, submission of specimen other than nasopharyngeal swab, presence of viral mutation(s) within the areas targeted by this assay, and inadequate number of viral copies (<131 copies/mL). A negative result must be combined with clinical observations, patient history, and epidemiological information. The expected result is Negative. Fact Sheet for Patients:  12/27/19 Fact Sheet for Healthcare Providers:  https://www.moore.com/ This test is not yet ap proved or cleared by the https://www.young.biz/ FDA and  has been  authorized for detection and/or diagnosis of SARS-CoV-2 by FDA under an Emergency Use Authorization (EUA). This EUA will remain  in effect (meaning this test can be used) for the duration of the COVID-19 declaration under Section 564(b)(1) of the Act, 21 U.S.C. section 360bbb-3(b)(1), unless the authorization is terminated or revoked sooner.    Influenza A by PCR NEGATIVE NEGATIVE Final   Influenza B by PCR NEGATIVE NEGATIVE Final    Comment: (NOTE) The Xpert Xpress SARS-CoV-2/FLU/RSV assay is intended as an aid in  the diagnosis of influenza from Nasopharyngeal swab specimens and  should not be used as a sole basis for treatment. Nasal  washings and  aspirates are unacceptable for Xpert Xpress SARS-CoV-2/FLU/RSV  testing. Fact Sheet for Patients: PinkCheek.be Fact Sheet for Healthcare Providers: GravelBags.it This test is not yet approved or cleared by the [place] FDA and  has been authorized for detection and/or diagnosis of SARS-CoV-2 by  FDA under an Emergency Use Authorization (EUA). This EUA will remain  in effect (meaning this test can be used) for the duration of the  Covid-19 declaration under Section 564(b)(1) of the Act, 21  U.S.C. section 360bbb-3(b)(1), unless the authorization is  terminated or revoked. Performed at [facility], [address] [place], [place] [address]    Studies/Results: NM Hepatobiliary Liver Func  Result Date: 12/31/2019 CLINICAL DATA:  Nausea and vomiting EXAM: NUCLEAR MEDICINE HEPATOBILIARY IMAGING TECHNIQUE: Sequential images of the abdomen were obtained out to 60 minutes following intravenous administration of radiopharmaceutical. RADIOPHARMACEUTICALS:  4.927 mCi Tc-84m  Choletec IV COMPARISON:  CT 12/25/2019 FINDINGS: Prompt uptake and biliary excretion of activity by the liver is seen. Biliary activity passes into small bowel, consistent with patent common bile duct. No filling of the gallbladder was identified after 1 hour. The decision was then made to administer morphine. 2 mg of morphine was administered by patient's nurse. Following the administration of IV morphine, gallbladder activity was visualized, consistent with patency of cystic duct. IMPRESSION: Patency of the cystic duct and common bile duct. No scintigraphic evidence of acute cholecystitis. Electronically Signed   By: Davina Poke D.O.   On: 12/31/2019 15:48   Medications:  I have reviewed the patient's current medications. Prior to Admission:  Medications Prior to Admission  Medication Sig Dispense Refill Last Dose  . acetaminophen  (TYLENOL) 325 MG tablet Take 325-650 mg by mouth every 6 (six) hours as needed for moderate pain or headache.    prn at prn  . cholecalciferol (VITAMIN D3) 25 MCG (1000 UNIT) tablet Take 1,000 Units by mouth daily.   unknown at unknown  . dorzolamide-timolol (COSOPT) 22.3-6.8 MG/ML ophthalmic solution Place 1 drop into both eyes 2 (two) times daily.   unknown at unknown  . lisinopril (ZESTRIL) 40 MG tablet Take 1 tablet (40 mg total) by mouth daily.   unknown at unknown  . LUMIGAN 0.01 % SOLN Place 1 drop into both eyes at bedtime.   unknown at unknown  . meclizine (ANTIVERT) 12.5 MG tablet Take 1 tablet (12.5 mg total) by mouth 3 (three) times daily as needed for dizziness (Or vertigo). 30 tablet 0 prn at prn  . metoprolol tartrate (LOPRESSOR) 25 MG tablet Take 25 mg by mouth 2 (two) times daily.   12/27/2019 at unknown  . omeprazole (PRILOSEC) 20 MG capsule Take 20 mg by mouth daily.   own at unknown   Scheduled: . cholecalciferol  1,000 Units Oral Daily  . dorzolamide-timolol  1 drop Both Eyes BID  .  enoxaparin (LOVENOX) injection  40 mg Subcutaneous Q24H  . feeding supplement  1 Container Oral TID BM  . latanoprost  1 drop Both Eyes QHS  . multivitamin with minerals  1 tablet Oral Daily  . pantoprazole  40 mg Oral Daily  . potassium chloride  40 mEq Oral BID   Continuous: . dextrose 5 % and 0.45% NaCl 50 mL/hr at 12/31/19 1211  . dextrose 5 % and 0.45% NaCl     BTD:HRCBULAGTXMIW, hydrALAZINE, meclizine, ondansetron (ZOFRAN) IV, oxyCODONE-acetaminophen, polyethylene glycol Anti-infectives (From admission, onward)   None     Scheduled Meds: . cholecalciferol  1,000 Units Oral Daily  . dorzolamide-timolol  1 drop Both Eyes BID  . enoxaparin (LOVENOX) injection  40 mg Subcutaneous Q24H  . feeding supplement  1 Container Oral TID BM  . latanoprost  1 drop Both Eyes QHS  . multivitamin with minerals  1 tablet Oral Daily  . pantoprazole  40 mg Oral Daily  . potassium chloride  40 mEq  Oral BID   Continuous Infusions: . dextrose 5 % and 0.45% NaCl 50 mL/hr at 12/31/19 1211  . dextrose 5 % and 0.45% NaCl     PRN Meds:.acetaminophen, hydrALAZINE, meclizine, ondansetron (ZOFRAN) IV, oxyCODONE-acetaminophen, polyethylene glycol   Assessment: Principal Problem:   AKI (acute kidney injury) ([facility]) Active Problems:   Hypertension   Hypokalemia   GERD (gastroesophageal reflux disease)   Fall   Abdominal pain   Intractable vomiting   Plan: Nausea, vomiting and right upper quadrant pain: EGD revealed mild gastric erythema and nonobstructing Schatzki's ring/mild stenosis of the GE junction Biopsies revealed chronic active gastritis, H. pylori stain pending HIDA scan today did not reveal any evidence of acute cholecystitis Resume diet Continue Protonix 40 mg daily Okay to discharge patient from GI standpoint Follow-up with GI as outpatient as needed   LOS: 5 days   Haruo Stepanek 12/31/2019, 6:16 PM

## 2019-12-31 NOTE — Progress Notes (Signed)
PROGRESS NOTE    [name]  OZD:[id number] DOB: January 21, 1936 DOA: 12/25/2019 PCP: [facility]    Brief Narrative:  Kristen Richard is a 84 y.o. female with medical history significant of hypertension, GERD, vertigo, fall, who presents with fall, right hip pain, nausea, vomiting, diarrhea. pt was found to have WBC 10.9, lipase 34, Covid PCR negative, CK 43, AKI with creatinine 1.17, BUN 29, temperature 97.5, blood pressure 140/73, tachycardia, oxygen saturation 100% on room air.  CT of abdomen/pelvis is negative.  X-ray of her right hip/pelvis negative.  Patient is placed on MedSurg bed for observation.   Consultants:   none  Procedures: CT  Antimicrobials:   none   Subjective: Ate a little yesterday.  Denies having any vomiting or nausea.  Today has not eaten since having HIDA scan  Objective: Vitals:   12/30/19 1933 12/31/19 0428 12/31/19 0748 12/31/19 1123  BP: 100/87 107/70 110/75 133/67  Pulse: 91 88 92 88  Resp:   19 18  Temp: 98.1 F (36.7 C) 98.5 F (36.9 C) 98.1 F (36.7 C) 98.3 F (36.8 C)  TempSrc: Oral Oral Oral Oral  SpO2: 95% 99% 98% 97%  Weight:      Height:        Intake/Output Summary (Last 24 hours) at 12/31/2019 1457 Last data filed at 12/31/2019 0654 Gross per 24 hour  Intake 595.1 ml  Output 950 ml  Net -354.9 ml   Filed Weights   12/25/19 2327 12/27/19 1221 12/30/19 0426  Weight: 67.8 kg 67.8 kg 67.5 kg    Examination:  General exam: Appears calm,  NAD Respiratory system: Clear to auscultation. Respiratory effort normal.no w/r/r Cardiovascular system: S1 & S2 heard, RRR. No  murmurs, rubs, gallops or clicks.  Gastrointestinal system: Abdomen is nondistended, soft and nontender. Normal bowel sounds heard.  No rebound or guarding Central nervous system: Alert and awake Extremities: No edema Skin: Warm dry Psychiatry: . Mood & affect appropriate in current setting.     Data Reviewed: I have personally reviewed following  labs and imaging studies  CBC: Recent Labs  Lab 12/25/19 0959 12/26/19 0452  WBC 10.9* 8.3  NEUTROABS 9.3*  --   HGB 15.1* 12.6  HCT 46.0 38.2  MCV 86.5 86.8  PLT 299 208   Basic Metabolic Panel: Recent Labs  Lab 12/25/19 0959 12/25/19 0959 12/26/19 0452 12/26/19 1802 12/27/19 0415 12/29/19 0423 12/30/19 0434  NA 140  --  141  --  140 137 138  K 3.0*   < > 2.8* 3.6 3.7 3.4* 3.0*  CL 93*  --  102  --  104 100 99  CO2 25  --  25  --  26 30 30   GLUCOSE 112*  --  85  --  83 107* 101*  BUN 29*  --  19  --  19 15 11   CREATININE 1.17*  --  0.82  --  0.86 0.77 0.78  CALCIUM 10.0  --  8.7*  --  8.3* 8.3* 8.5*  MG 1.7  --  1.8  --   --   --   --    < > = values in this interval not displayed.   GFR: Estimated Creatinine Clearance: 50.8 mL/min (by C-G formula based on SCr of 0.78 mg/dL). Liver Function Tests: Recent Labs  Lab 12/25/19 0959  AST 23  ALT 13  ALKPHOS 60  BILITOT 1.7*  PROT 8.1  ALBUMIN 4.0   Recent Labs  Lab 12/25/19 0959  LIPASE 34   No results for input(s): AMMONIA in the last 168 hours. Coagulation Profile: No results for input(s): INR, PROTIME in the last 168 hours. Cardiac Enzymes: Recent Labs  Lab 12/25/19 0959  CKTOTAL 43   BNP (last 3 results) No results for input(s): PROBNP in the last 8760 hours. HbA1C: No results for input(s): HGBA1C in the last 72 hours. CBG: Recent Labs  Lab 12/28/19 0836 12/28/19 1003 12/29/19 0748 12/30/19 0730 12/31/19 0750  GLUCAP 63* 92 96 98 89   Lipid Profile: No results for input(s): CHOL, HDL, LDLCALC, TRIG, CHOLHDL, LDLDIRECT in the last 72 hours. Thyroid Function Tests: No results for input(s): TSH, T4TOTAL, FREET4, T3FREE, THYROIDAB in the last 72 hours. Anemia Panel: No results for input(s): VITAMINB12, FOLATE, FERRITIN, TIBC, IRON, RETICCTPCT in the last 72 hours. Sepsis Labs: No results for input(s): PROCALCITON, LATICACIDVEN in the last 168 hours.  Recent Results (from the past 240  hour(s))  Respiratory Panel by RT PCR (Flu A&B, Covid) - Nasopharyngeal Swab     Status: None   Collection Time: 12/25/19 11:10 AM   Specimen: Nasopharyngeal Swab  Result Value Ref Range Status   SARS Coronavirus 2 by RT PCR NEGATIVE NEGATIVE Final    Comment: (NOTE) SARS-CoV-2 target nucleic acids are NOT DETECTED. The SARS-CoV-2 RNA is generally detectable in upper respiratoy specimens during the acute phase of infection. The lowest concentration of SARS-CoV-2 viral copies this assay can detect is 131 copies/mL. A negative result does not preclude SARS-Cov-2 infection and should not be used as the sole basis for treatment or other patient management decisions. A negative result may occur with  improper specimen collection/handling, submission of specimen other than nasopharyngeal swab, presence of viral mutation(s) within the areas targeted by this assay, and inadequate number of viral copies (<131 copies/mL). A negative result must be combined with clinical observations, patient history, and epidemiological information. The expected result is Negative. Fact Sheet for Patients:  https://www.moore.com/ Fact Sheet for Healthcare Providers:  https://www.young.biz/ This test is not yet ap proved or cleared by the [place] FDA and  has been authorized for detection and/or diagnosis of SARS-CoV-2 by FDA under an Emergency Use Authorization (EUA). This EUA will remain  in effect (meaning this test can be used) for the duration of the COVID-19 declaration under Section 564(b)(1) of the Act, 21 U.S.C. section 360bbb-3(b)(1), unless the authorization is terminated or revoked sooner.    Influenza A by PCR NEGATIVE NEGATIVE Final   Influenza B by PCR NEGATIVE NEGATIVE Final    Comment: (NOTE) The Xpert Xpress SARS-CoV-2/FLU/RSV assay is intended as an aid in  the diagnosis of influenza from Nasopharyngeal swab specimens and  should not be used as  a sole basis for treatment. Nasal washings and  aspirates are unacceptable for Xpert Xpress SARS-CoV-2/FLU/RSV  testing. Fact Sheet for Patients: https://www.moore.com/ Fact Sheet for Healthcare Providers: https://www.young.biz/ This test is not yet approved or cleared by the [place] FDA and  has been authorized for detection and/or diagnosis of SARS-CoV-2 by  FDA under an Emergency Use Authorization (EUA). This EUA will remain  in effect (meaning this test can be used) for the duration of the  Covid-19 declaration under Section 564(b)(1) of the Act, 21  U.S.C. section 360bbb-3(b)(1), unless the authorization is  terminated or revoked. Performed at [facility], [address], [place], [place] [address]          Radiology Studies: No results found.      Scheduled  Meds: . cholecalciferol  1,000 Units Oral Daily  . dorzolamide-timolol  1 drop Both Eyes BID  . enoxaparin (LOVENOX) injection  40 mg Subcutaneous Q24H  . feeding supplement  1 Container Oral TID BM  . latanoprost  1 drop Both Eyes QHS  . multivitamin with minerals  1 tablet Oral Daily  . pantoprazole  40 mg Oral Daily  . potassium chloride  40 mEq Oral BID   Continuous Infusions: . dextrose 5 % and 0.45% NaCl 50 mL/hr at 12/31/19 1211  . dextrose 5 % and 0.45% NaCl      Assessment & Plan:   Principal Problem:   AKI (acute kidney injury) ([place]) Active Problems:   Hypertension   Hypokalemia   GERD (gastroesophageal reflux disease)   Fall   Abdominal pain   Intractable vomiting  AKI: Likely due to prerenal secondary to dehydration and continuation of ACEI Improved We will continue IV fluid - Avoid using renal toxic medications, hypotension and contrast dye (or carefully use) - Hold lisinopril  Hypertension -Continue to hold lisinopril due to AKI metoprolol discontinued as she is having bradycardia and pauses If need to will utilize IV  hydralazine and initiate amlodipine in a.m.   Hypokalemia: K= 3.0   , level today pending Likely from vomiting We will give KCl 40 mEq p.o. twice daily x1 day Magnesium stable   Bradycardia -up to 3.5-second pause  On beta-blocker and nauseous and vomiting (vasovagal?) Resolved after stopping metoprolol.    Continue monitoring on telemetry   Vomiting /nausea CT abd no acute abn. S/p EGD on 12/27/2019 found with gastritis, and benign esophageal stenosis Recommended Protonix GI following-recommend HIDA if ongoing symptoms, will order Plan: HIDA today, pending, will d/w GI the results   GERD (gastroesophageal reflux disease): -protonix  Fall: X-ray of right hip is negative. -f/u CT-head -pt/ot-recommend SNF, but going home with home health -per Percocet for right Hip pain  Abdominal pain: CT abdomen/pelvis negative.  Lipase normal 34. -Supportive care -As needed percocet    DVT prophylaxis: Lovenox Code Status: Full code Family Communication: None at bedside Disposition Plan: Still not tolerating PO intake. Plan for HIDA today and its findings   LOS: 5 days   Time spent: 45 minutes with more than 50% on [place], MD [facility] Pager [phone]  If 7PM-7AM, please contact night-coverage [email or web address] Password [facility] 12/31/2019, 2:57 PM Patient ID: Shelbee Apgar, female   DOB: 1936/08/17, 84 y.o.   MRN: [id number]

## 2019-12-31 NOTE — Care Management Important Message (Signed)
Important Message  Patient Details  Name: Kristen Richard MRN: [id number] Date of Birth: 01-05-1936   Medicare Important Message Given:  Yes     [name] 12/31/2019, 1:23 PM

## 2019-12-31 NOTE — Progress Notes (Signed)
PT Cancellation Note  Patient Details Name: Kristen Richard MRN: [id number] DOB: Mar 21, 1936   Cancelled Treatment:    Reason Eval/Treat Not Completed: Other (comment)(Pt off the floor at this time for imaging. PT to follow up as able.)  Olga Coaster PT, DPT 2:45 PM,12/31/19

## 2020-01-01 LAB — PHOSPHORUS: Phosphorus: 1 mg/dL — CL (ref 2.5–4.6)

## 2020-01-01 LAB — GLUCOSE, CAPILLARY
Glucose-Capillary: 71 mg/dL (ref 70–99)
Glucose-Capillary: 105 mg/dL — ABNORMAL HIGH (ref 70–99)

## 2020-01-01 LAB — MAGNESIUM: Magnesium: 1.5 mg/dL — ABNORMAL LOW (ref 1.7–2.4)

## 2020-01-01 LAB — POTASSIUM: Potassium: 4.9 mmol/L (ref 3.5–5.1)

## 2020-01-01 MED ORDER — MAGNESIUM OXIDE 400 (241.3 MG) MG PO TABS: 800.0000 mg | ORAL_TABLET | Freq: Every day | ORAL | 0 refills | Status: DC

## 2020-01-01 MED ORDER — ENSURE ENLIVE PO LIQD
237.0000 mL | Freq: Two times a day (BID) | ORAL | Status: DC
  Administered 2020-01-01: 237 mL via ORAL

## 2020-01-01 MED ORDER — K PHOS MONO-SOD PHOS DI & MONO 155-852-130 MG PO TABS: 500.0000 mg | ORAL_TABLET | Freq: Two times a day (BID) | ORAL | 0 refills | Status: DC

## 2020-01-01 MED ORDER — ADULT MULTIVITAMIN W/MINERALS CH: 1.0000 | ORAL_TABLET | Freq: Every day | ORAL | 0 refills | Status: DC

## 2020-01-01 MED ORDER — MAGNESIUM OXIDE 400 (241.3 MG) MG PO TABS
800.0000 mg | ORAL_TABLET | Freq: Every day | ORAL | Status: DC
  Administered 2020-01-01: 800 mg via ORAL
  Filled 2020-01-01: qty 2

## 2020-01-01 MED ORDER — AMLODIPINE BESYLATE 5 MG PO TABS
2.5000 mg | ORAL_TABLET | Freq: Every day | ORAL | Status: DC
  Administered 2020-01-01: 2.5 mg via ORAL
  Filled 2020-01-01: qty 1

## 2020-01-01 MED ORDER — PANTOPRAZOLE SODIUM 40 MG PO TBEC: 40.0000 mg | DELAYED_RELEASE_TABLET | Freq: Every day | ORAL | 0 refills | Status: DC

## 2020-01-01 MED ORDER — AMLODIPINE BESYLATE 2.5 MG PO TABS: 2.5000 mg | ORAL_TABLET | Freq: Every day | ORAL | 0 refills | Status: DC

## 2020-01-01 MED ORDER — K PHOS MONO-SOD PHOS DI & MONO 155-852-130 MG PO TABS
500.0000 mg | ORAL_TABLET | Freq: Two times a day (BID) | ORAL | Status: DC
  Administered 2020-01-01: 500 mg via ORAL
  Filled 2020-01-01 (×2): qty 2

## 2020-01-01 MED ORDER — MAGNESIUM SULFATE 2 GM/50ML IV SOLN: 2.0000 g | Freq: Once | INTRAVENOUS | Status: DC

## 2020-01-01 NOTE — Progress Notes (Signed)
PT worked with patient-  HR jumped up into the 140s.   PT unable to complete session.   Patient was asymptomatic during session.

## 2020-01-01 NOTE — Discharge Summary (Signed)
Kristen Richard KGM:[id number] DOB: [birth year] DOA: 12/25/2019  PCP: [facility] [place] date: 12/25/2019 Discharge date: 01/01/2020  Admitted From: home Disposition:  home  Recommendations for Outpatient Follow-up:  1. Follow up with PCP in 1 week 2. Please obtain BMP/CBC in one week 3. Please follow up on the following pending results:  Home Health:yes  Discharge Condition:Stable CODE STATUS:full code Diet recommendation: Heart Healthy / Carb Modified  Brief/Interim Summary: Kristen Richard is a 84 y.o. female with medical history significant of hypertension, GERD, vertigo, fall, who presents with fall, right hip pain, nausea, vomiting, diarrhea.pt was found to have WBC 10.9, lipase 34, Covid PCR negative, CK 43, AKI with creatinine 1.17, BUN 29, temperature 97.5, blood pressure 140/73, tachycardia, oxygen saturation 100% on room air. CT of abdomen/pelvis is negative. X-ray of her right hip/pelvis negative.  She was found with acute kidney injury due to prerenal secondary to dehydration vomiting diarrhea and ACE inhibitor's.  It improved with IV hydration.  She was found with hypokalemia which was repleted. Her magnesium and phosphorus was repleted. She was found with bradycardia with 3/5 pause but she was on beta blockers and was vomiting (vasovagal component), which resolved after stopping beta blocker. She was sinus tachycardia at times with ambulation likely 2/2 volume depletion. Her nausea and vomiting continued and she is status post EGD when GI was consulted on 12/27/2019 which was found with gastritis and benign esophageal stenosis.  Protonix was recommended.  She continued having nausea and vomiting and underwent HIDA scan which was negative.  Since yesterday she has been tolerating her feeding.  She was encouraged to increase her p.o. intake and hydration.  She has PT OT which recommended SNF but patient wanted to go home with home health with her daughter.  Discharge  Diagnoses:  Principal Problem:   AKI (acute kidney injury) ([place]) Active Problems:   Hypertension   Hypokalemia   GERD (gastroesophageal reflux disease)   Fall   Abdominal pain   Intractable vomiting    Discharge Instructions   Allergies as of 01/01/2020      Reactions   Sulfa Antibiotics Swelling   Pt was told it caused her brain to swell - [facility] documented in 2014, pt is unaware of this    Acetazolamide Nausea And Vomiting   Dizziness       Medication List    STOP taking these medications   lisinopril 40 MG tablet Commonly known as: ZESTRIL   metoprolol tartrate 25 MG tablet Commonly known as: LOPRESSOR   omeprazole 20 MG capsule Commonly known as: PRILOSEC Replaced by: pantoprazole 40 MG tablet     TAKE these medications   acetaminophen 325 MG tablet Commonly known as: TYLENOL Take 325-650 mg by mouth every 6 (six) hours as needed for moderate pain or headache.   amLODipine 2.5 MG tablet Commonly known as: NORVASC Take 1 tablet (2.5 mg total) by mouth daily.   cholecalciferol 25 MCG (1000 UNIT) tablet Commonly known as: VITAMIN D3 Take 1,000 Units by mouth daily.   dorzolamide-timolol 22.3-6.8 MG/ML ophthalmic solution Commonly known as: COSOPT Place 1 drop into both eyes 2 (two) times daily.   Lumigan 0.01 % Soln Generic drug: bimatoprost Place 1 drop into both eyes at bedtime.   magnesium oxide 400 (241.3 Mg) MG tablet Commonly known as: MAG-OX Take 2 tablets (800 mg total) by mouth daily.   meclizine 12.5 MG tablet Commonly known as: ANTIVERT Take 1 tablet (12.5 mg total) by mouth 3 (three)  times daily as needed for dizziness (Or vertigo).   multivitamin with minerals Tabs tablet Take 1 tablet by mouth daily.   pantoprazole 40 MG tablet Commonly known as: PROTONIX Take 1 tablet (40 mg total) by mouth daily. Replaces: omeprazole 20 MG capsule   phosphorus 155-852-130 MG tablet Commonly known as: K PHOS NEUTRAL Take 2 tablets (500 mg total)  by mouth 2 (two) times daily.      Follow-up Information    Wyline Mood, MD Follow up in 2 week(s).   Specialty: Gastroenterology Contact information: [address] [place] [address] [phone]        [facility] Follow up in 1 week(s).   Specialty: General Practice Why: needs blood work Solicitor information: [facility]. [facility] [place] [address] [phone]          Allergies  Allergen Reactions  . Sulfa Antibiotics Swelling    Pt was told it caused her brain to swell - unc documented in 2014, pt is unaware of this   . Acetazolamide Nausea And Vomiting    Dizziness     Consultations:  Gastroenterology   Procedures/Studies: CT HEAD WO CONTRAST  Result Date: 12/26/2019 CLINICAL DATA:  Posttraumatic headache. EXAM: CT HEAD WITHOUT CONTRAST TECHNIQUE: Contiguous axial images were obtained from the base of the skull through the vertex without intravenous contrast. COMPARISON:  Head CT 10/26/2019, brain MRI 10/27/2019 FINDINGS: Brain: No intracranial hemorrhage, mass effect, or midline shift. Atrophy and ventriculomegaly are stable. The basilar cisterns are patent. No evidence of territorial infarct or acute ischemia. No extra-axial or intracranial fluid collection. Vascular: Atherosclerosis of skullbase vasculature without hyperdense vessel or abnormal calcification. Skull: No fracture or focal lesion. Sinuses/Orbits: No acute findings. Chronic opacification of lower left mastoid air cells. Prior left cataract resection. Other: None. IMPRESSION: 1. No acute intracranial abnormality. No skull fracture. 2. Stable atrophy. Stable ventriculomegaly likely related to central atrophy, normal pressure hydrocephalus could have a similar appearance. Electronically Signed   By: Narda Rutherford M.D.   On: 12/26/2019 06:54   NM Hepatobiliary Liver Func  Result Date: 12/31/2019 CLINICAL DATA:  Nausea and vomiting EXAM: NUCLEAR MEDICINE  HEPATOBILIARY IMAGING TECHNIQUE: Sequential images of the abdomen were obtained out to 60 minutes following intravenous administration of radiopharmaceutical. RADIOPHARMACEUTICALS:  4.927 mCi Tc-51m  Choletec IV COMPARISON:  CT 12/25/2019 FINDINGS: Prompt uptake and biliary excretion of activity by the liver is seen. Biliary activity passes into small bowel, consistent with patent common bile duct. No filling of the gallbladder was identified after 1 hour. The decision was then made to administer morphine. 2 mg of morphine was administered by patient's nurse. Following the administration of IV morphine, gallbladder activity was visualized, consistent with patency of cystic duct. IMPRESSION: Patency of the cystic duct and common bile duct. No scintigraphic evidence of acute cholecystitis. Electronically Signed   By: Duanne Guess D.O.   On: 12/31/2019 15:48   CT ABDOMEN PELVIS W CONTRAST  Result Date: 12/25/2019 CLINICAL DATA:  Abdominal pain. Nausea. EXAM: CT ABDOMEN AND PELVIS WITH CONTRAST TECHNIQUE: Multidetector CT imaging of the abdomen and pelvis was performed using the standard protocol following bolus administration of intravenous contrast. CONTRAST:  64mL OMNIPAQUE IOHEXOL 300 MG/ML  SOLN COMPARISON:  CT scan of the pelvis dated 10/26/2019 FINDINGS: Lower chest: Small hiatal hernia. Otherwise normal. Hepatobiliary: Small focal area of fatty infiltration adjacent to the falciform ligament. Liver parenchyma is otherwise normal. Biliary tree is normal including the gallbladder. Pancreas: Unremarkable.  No pancreatic ductal dilatation or surrounding inflammatory changes. Spleen: Normal in size without focal abnormality. Adrenals/Urinary Tract: Normal adrenal glands. 4 mm cyst in the mid left kidney. Kidneys are otherwise normal. No hydronephrosis. Bladder is empty. Stomach/Bowel: Tiny hiatal hernia. Stomach and small bowel appear normal including the terminal ileum. Tiny calcification at the tip of the  cecum may represent a appendicoliths in a very small appendix. Vascular/Lymphatic: Aortic atherosclerosis. No enlarged abdominal or pelvic lymph nodes. Reproductive: Uterus and bilateral adnexa are unremarkable. Other: No abdominal wall hernia or abnormality. No abdominopelvic ascites. Musculoskeletal: No acute or significant osseous findings. IMPRESSION: Benign-appearing abdomen and pelvis. Aortic Atherosclerosis (ICD10-I70.0). Electronically Signed   By: Francene BoyersJames  Maxwell M.D.   On: 12/25/2019 15:14   DG Abdomen Acute W/Chest  Result Date: 12/25/2019 CLINICAL DATA:  Vomiting and constipation. EXAM: DG ABDOMEN ACUTE W/ 1V CHEST COMPARISON:  Chest radiograph December 12, 2010 FINDINGS: PA chest: Lungs are clear. Heart size and pulmonary vascularity are normal. No adenopathy. Supine and upright abdomen: There is moderate stool in the colon. There is no bowel dilatation or air-fluid level to suggest bowel obstruction. No free air. There are parent phleboliths in the pelvis. There is mid lumbar dextroscoliosis with degenerative change in the lumbar spine. IMPRESSION: No bowel obstruction or free air. Moderate stool in colon. Lungs clear. Electronically Signed   By: Bretta BangWilliam  Woodruff III M.D.   On: 12/25/2019 10:47   DG HIP UNILAT WITH PELVIS 2-3 VIEWS RIGHT  Result Date: 12/25/2019 CLINICAL DATA:  Unable to stand EXAM: DG HIP (WITH OR WITHOUT PELVIS) 2-3V RIGHT COMPARISON:  CT 12/25/2019 FINDINGS: Contrast material within the bladder. Pubic symphysis and rami appear intact. Limited evaluation of left femoral neck due to superimposition of the trochanter. No acute displaced fracture or malalignment on the right. IMPRESSION: 1. No acute osseous abnormality. 2. Contrast within the urinary bladder Electronically Signed   By: Jasmine PangKim  Fujinaga M.D.   On: 12/25/2019 18:13      Subjective: Patient has no complaints today tolerated her breakfast.  No nausea or vomiting  Discharge Exam: Vitals:   01/01/20 0725  01/01/20 1116  BP: (!) 137/93 (!) 135/104  Pulse: 87 (!) 119  Resp:  18  Temp: 98 F (36.7 C) (!) 97.4 F (36.3 C)  SpO2: 97% 98%   Vitals:   12/31/19 2010 01/01/20 0439 01/01/20 0725 01/01/20 1116  BP: 106/63 132/79 (!) 137/93 (!) 135/104  Pulse: 91 90 87 (!) 119  Resp: 20 20  18   Temp: 98.2 F (36.8 C) 98.2 F (36.8 C) 98 F (36.7 C) (!) 97.4 F (36.3 C)  TempSrc: Oral Oral Oral Oral  SpO2: 97% 98% 97% 98%  Weight:  68.7 kg    Height:        General: Pt is alert, awake, not in acute distress Cardiovascular: RRR, S1/S2 +, no rubs, no gallops Respiratory: CTA bilaterally, no wheezing, no rhonchi Abdominal: Soft, NT, ND, bowel sounds + Extremities: no edema, no cyanosis    The results of significant diagnostics from this hospitalization (including imaging, microbiology, ancillary and laboratory) are listed below for reference.     Microbiology: Recent Results (from the past 240 hour(s))  Respiratory Panel by RT PCR (Flu A&B, Covid) - Nasopharyngeal Swab     Status: None   Collection Time: 12/25/19 11:10 AM   Specimen: Nasopharyngeal Swab  Result Value Ref Range Status   SARS Coronavirus 2 by RT PCR NEGATIVE NEGATIVE Final    Comment: (NOTE) SARS-CoV-2 target  nucleic acids are NOT DETECTED. The SARS-CoV-2 RNA is generally detectable in upper respiratoy specimens during the acute phase of infection. The lowest concentration of SARS-CoV-2 viral copies this assay can detect is 131 copies/mL. A negative result does not preclude SARS-Cov-2 infection and should not be used as the sole basis for treatment or other patient management decisions. A negative result may occur with  improper specimen collection/handling, submission of specimen other than nasopharyngeal swab, presence of viral mutation(s) within the areas targeted by this assay, and inadequate number of viral copies (<131 copies/mL). A negative result must be combined with clinical observations, patient  history, and epidemiological information. The expected result is Negative. Fact Sheet for Patients:  https://www.moore.com/ Fact Sheet for Healthcare Providers:  https://www.young.biz/ This test is not yet ap proved or cleared by the [place] FDA and  has been authorized for detection and/or diagnosis of SARS-CoV-2 by FDA under an Emergency Use Authorization (EUA). This EUA will remain  in effect (meaning this test can be used) for the duration of the COVID-19 declaration under Section 564(b)(1) of the Act, 21 U.S.C. section 360bbb-3(b)(1), unless the authorization is terminated or revoked sooner.    Influenza A by PCR NEGATIVE NEGATIVE Final   Influenza B by PCR NEGATIVE NEGATIVE Final    Comment: (NOTE) The Xpert Xpress SARS-CoV-2/FLU/RSV assay is intended as an aid in  the diagnosis of influenza from Nasopharyngeal swab specimens and  should not be used as a sole basis for treatment. Nasal washings and  aspirates are unacceptable for Xpert Xpress SARS-CoV-2/FLU/RSV  testing. Fact Sheet for Patients: https://www.moore.com/ Fact Sheet for Healthcare Providers: https://www.young.biz/ This test is not yet approved or cleared by the [place] FDA and  has been authorized for detection and/or diagnosis of SARS-CoV-2 by  FDA under an Emergency Use Authorization (EUA). This EUA will remain  in effect (meaning this test can be used) for the duration of the  Covid-19 declaration under Section 564(b)(1) of the Act, 21  U.S.C. section 360bbb-3(b)(1), unless the authorization is  terminated or revoked. Performed at [facility], [address] [place], [place] [address]      Labs: BNP (last 3 results) No results for input(s): BNP in the last 8760 hours. Basic Metabolic Panel: Recent Labs  Lab 12/26/19 0452 12/26/19 1802 12/27/19 0415 12/29/19 0423 12/30/19 0434 12/31/19 1714  01/01/20 1136  NA 141  --  140 137 138  --   --   K 2.8*   < > 3.7 3.4* 3.0* 4.1 4.9  CL 102  --  104 100 99  --   --   CO2 25  --  26 30 30   --   --   GLUCOSE 85  --  83 107* 101*  --   --   BUN 19  --  19 15 11   --   --   CREATININE 0.82  --  0.86 0.77 0.78  --   --   CALCIUM 8.7*  --  8.3* 8.3* 8.5*  --   --   MG 1.8  --   --   --   --   --  1.5*  PHOS  --   --   --   --   --   --  1.0*   < > = values in this interval not displayed.   Liver Function Tests: No results for input(s): AST, ALT, ALKPHOS, BILITOT, PROT, ALBUMIN in the last 168 hours. No results for input(s): LIPASE, AMYLASE  in the last 168 hours. No results for input(s): AMMONIA in the last 168 hours. CBC: Recent Labs  Lab 12/26/19 0452  WBC 8.3  HGB 12.6  HCT 38.2  MCV 86.8  PLT 208   Cardiac Enzymes: No results for input(s): CKTOTAL, CKMB, CKMBINDEX, TROPONINI in the last 168 hours. BNP: Invalid input(s): POCBNP CBG: Recent Labs  Lab 12/29/19 0748 12/30/19 0730 12/31/19 0750 01/01/20 0743 01/01/20 1115  GLUCAP 96 98 89 71 105*   D-Dimer No results for input(s): DDIMER in the last 72 hours. Hgb A1c No results for input(s): HGBA1C in the last 72 hours. Lipid Profile No results for input(s): CHOL, HDL, LDLCALC, TRIG, CHOLHDL, LDLDIRECT in the last 72 hours. Thyroid function studies No results for input(s): TSH, T4TOTAL, T3FREE, THYROIDAB in the last 72 hours.  Invalid input(s): FREET3 Anemia work up No results for input(s): VITAMINB12, FOLATE, FERRITIN, TIBC, IRON, RETICCTPCT in the last 72 hours. Urinalysis    Component Value Date/Time   COLORURINE YELLOW (A) 10/26/2019 1809   APPEARANCEUR CLEAR (A) 10/26/2019 1809   LABSPEC 1.017 10/26/2019 1809   PHURINE 5.0 10/26/2019 1809   GLUCOSEU NEGATIVE 10/26/2019 1809   HGBUR MODERATE (A) 10/26/2019 1809   BILIRUBINUR NEGATIVE 10/26/2019 1809   KETONESUR 20 (A) 10/26/2019 1809   PROTEINUR 30 (A) 10/26/2019 1809   NITRITE NEGATIVE 10/26/2019  1809   LEUKOCYTESUR NEGATIVE 10/26/2019 1809   Sepsis Labs Invalid input(s): PROCALCITONIN,  WBC,  LACTICIDVEN Microbiology Recent Results (from the past 240 hour(s))  Respiratory Panel by RT PCR (Flu A&B, Covid) - Nasopharyngeal Swab     Status: None   Collection Time: 12/25/19 11:10 AM   Specimen: Nasopharyngeal Swab  Result Value Ref Range Status   SARS Coronavirus 2 by RT PCR NEGATIVE NEGATIVE Final    Comment: (NOTE) SARS-CoV-2 target nucleic acids are NOT DETECTED. The SARS-CoV-2 RNA is generally detectable in upper respiratoy specimens during the acute phase of infection. The lowest concentration of SARS-CoV-2 viral copies this assay can detect is 131 copies/mL. A negative result does not preclude SARS-Cov-2 infection and should not be used as the sole basis for treatment or other patient management decisions. A negative result may occur with  improper specimen collection/handling, submission of specimen other than nasopharyngeal swab, presence of viral mutation(s) within the areas targeted by this assay, and inadequate number of viral copies (<131 copies/mL). A negative result must be combined with clinical observations, patient history, and epidemiological information. The expected result is Negative. Fact Sheet for Patients:  https://www.moore.com/ Fact Sheet for Healthcare Providers:  https://www.young.biz/ This test is not yet ap proved or cleared by the [place] FDA and  has been authorized for detection and/or diagnosis of SARS-CoV-2 by FDA under an Emergency Use Authorization (EUA). This EUA will remain  in effect (meaning this test can be used) for the duration of the COVID-19 declaration under Section 564(b)(1) of the Act, 21 U.S.C. section 360bbb-3(b)(1), unless the authorization is terminated or revoked sooner.    Influenza A by PCR NEGATIVE NEGATIVE Final   Influenza B by PCR NEGATIVE NEGATIVE Final    Comment:  (NOTE) The Xpert Xpress SARS-CoV-2/FLU/RSV assay is intended as an aid in  the diagnosis of influenza from Nasopharyngeal swab specimens and  should not be used as a sole basis for treatment. Nasal washings and  aspirates are unacceptable for Xpert Xpress SARS-CoV-2/FLU/RSV  testing. Fact Sheet for Patients: https://www.moore.com/ Fact Sheet for Healthcare Providers: https://www.young.biz/ This test is not yet approved or cleared by  the [employer] and  has been authorized for detection and/or diagnosis of SARS-CoV-2 by  FDA under an Emergency Use Authorization (EUA). This EUA will remain  in effect (meaning this test can be used) for the duration of the  Covid-19 declaration under Section 564(b)(1) of the Act, 21  U.S.C. section 360bbb-3(b)(1), unless the authorization is  terminated or revoked. Performed at [facility], [address], [place], [place] [address]      Time coordinating discharge: Over 30 minutes  SIGNED:   Lynn Ito, MD  [facility] 01/01/2020, 12:33 PM Pager   If 7PM-7AM, please contact night-coverage [email or web address] Password [email or web address]

## 2020-01-01 NOTE — Progress Notes (Signed)
Discussed discharge instructions with patient and family (including medications and follow up appointments.)   Sent home instructions with them

## 2020-01-01 NOTE — Progress Notes (Signed)
Physical Therapy Treatment Patient Details Name: Kristen Richard MRN: [id number] DOB: 03/02/1936 Today's Date: 01/01/2020    History of Present Illness Pt is an 84 y.o. female presenting to hospital with generalized weakness, severe fatigue, unable to stand/walk; not eating; smell off past 2 weeks.  Pt admitted with AKI, htn, hypokalemia, abdominal pain, and h/o recent fall.  PMH includes CKD, htn, fall risk, vertigo.    PT Comments    The patient in bed with RN at bedside, agreeable to PT stated she has not been out of bed in a few days. Supine to sit with minA for weight shift and trunk elevation. Sit <> stand from EOB and from recliner, minA ea attempt with RW. Pt exhibited deficits in initial standing balance, needed cueing and minA to achieve balance. The patient was able to take several steps towards recliner in room.  Exhibited unsteadiness. Posterior LOB noted when attempting to pivot and back up to sit in chair, modA to correct. Further ambulation limited due to elevated HR. (Able to statically stand with minA due to posterior lean ~30-45seconds to assess HR, HR continued to elevate, instructed to sit with HR at 146 BPM). Pt asymptomatic, though fatigue and mild SOB noted. Patient up in chair with all needs in reach at end of session. The patient would benefit from further skilled PT intervention to maximize safety, mobility, and independence.      Follow Up Recommendations  SNF     Equipment Recommendations  None recommended by PT    Recommendations for Other Services OT consult     Precautions / Restrictions Precautions Precautions: Fall Restrictions Weight Bearing Restrictions: No    Mobility  Bed Mobility Overal bed mobility: Needs Assistance Bed Mobility: Supine to Sit     Supine to sit: Min assist     General bed mobility comments: HOB raised, minA for weight shifting/LE management to scoot to EOB.  Transfers Overall transfer level: Needs assistance Equipment used:  Rolling walker (2 wheeled) Transfers: Sit to/from Stand Sit to Stand: Min assist            Ambulation/Gait Ambulation/Gait assistance: Min Chemical engineer (Feet): 4 Feet Assistive device: Rolling walker (2 wheeled)       General Gait Details: Pt with decreased gait velocity. Exhibited unsteadiness. Posterior LOB noted when attempting to pivot and back up to sit in chair, modA to correct. Further ambulation limited due to elevated HR.   Stairs             Wheelchair Mobility    Modified Rankin (Stroke Patients Only)       Balance Overall balance assessment: Needs assistance Sitting-balance support: Feet supported;No upper extremity supported Sitting balance-Leahy Scale: Good     Standing balance support: Bilateral upper extremity supported;During functional activity Standing balance-Leahy Scale: Poor Standing balance comment: pt requiring B UE support on RW for balance with taking steps                            Cognition Arousal/Alertness: Awake/alert Behavior During Therapy: WFL for tasks assessed/performed Overall Cognitive Status: Within Functional Limits for tasks assessed                                        Exercises      General Comments        Pertinent Vitals/Pain Pain  Assessment: No/denies pain Pain Location: Pt reports legs are not painful unless touched Pain Intervention(s): Monitored during session;Repositioned    Home Living                      Prior Function            PT Goals (current goals can now be found in the care plan section) Progress towards PT goals: Progressing toward goals    Frequency    Min 2X/week      PT Plan Current plan remains appropriate    Co-evaluation              AM-PAC PT "6 Clicks" Mobility   Outcome Measure  Help needed turning from your back to your side while in a flat bed without using bedrails?: A Little Help needed moving from  lying on your back to sitting on the side of a flat bed without using bedrails?: A Little Help needed moving to and from a bed to a chair (including a wheelchair)?: A Little Help needed standing up from a chair using your arms (e.g., wheelchair or bedside chair)?: A Little Help needed to walk in hospital room?: A Little Help needed climbing 3-5 steps with a railing? : A Lot 6 Click Score: 17    End of Session Equipment Utilized During Treatment: Gait belt Activity Tolerance: Patient tolerated treatment well;Treatment limited secondary to medical complications (Comment);Other (comment)(limited by HR) Patient left: in chair;with call bell/phone within reach;with chair alarm set Nurse Communication: Mobility status PT Visit Diagnosis: Other abnormalities of gait and mobility (R26.89);Muscle weakness (generalized) (M62.81);History of falling (Z91.81);Difficulty in walking, not elsewhere classified (R26.2)     Time: 1101-1130 PT Time Calculation (min) (ACUTE ONLY): 29 min  Charges:  $Therapeutic Exercise: 23-37 mins                     Lieutenant Diego PT, DPT 12:46 PM,01/01/20

## 2020-01-01 NOTE — Progress Notes (Addendum)
Nutrition Follow Up Note   DOCUMENTATION CODES:   Not applicable  INTERVENTION:   Ensure Enlive po BID, each supplement provides 350 kcal and 20 grams of protein  Magic cup TID with meals, each supplement provides 290 kcal and 9 grams of protein  MVI daily   Pt is at refeed risk; recommend monitor K, Mg and P labs as oral intake improves  NUTRITION DIAGNOSIS:   Inadequate oral intake related to acute illness as evidenced by per patient/family report.  GOAL:   Patient will meet greater than or equal to 90% of their needs  -not met   MONITOR:   PO intake, Supplement acceptance, Labs, Weight trends, Skin, I & O's  ASSESSMENT:   84 y.o. female with medical history significant of hypertension, GERD, vertigo, fall, who presents s/p fall with right hip pain, nausea, vomiting and diarrhea.  Pt s/p EGD 2/18 which revealed mild gastric erythema and nonobstructing Schatzki's ring/mild stenosis of the GE junction. Biopsies revealed chronic active gastritis, H. pylori stain pending  Pt s/p HIDA scan 2/22 which did not reveal any evidence of acute cholecystitis  Pt advanced to a 2g sodium restricted diet yesterday; pt continues to have poor appetite and oral intake in hospital. Pt eating only sips and bites of meals. Pt refusing Boost supplements. RD will add Ensure and Magic Cups to meal trays. Pt is at refeed risk; recommend monitor electrolytes. Pt denies any nausea or vomiting. No BM noted since 2/17. Per chart, pt appears weight stable since admit.   Addendum: Pt to discharge today. Pt's Mg and P labs returned low; pt with severe refeeding. Recommend IV supplementation of electrolytes and return of labs to wnl prior to discharge. Discussed with MD and pharmacist; pt's family needing to take patient home today before 2pm. Oral supplementation Rx'd. Would recommend follow up tomorrow morning to have labs rechecked.   Medications reviewed and include: vitamin D3, lovenox, MVI, protonix,  KCl, NaCl w/ 5% dextrose _0 /hr  Labs reviewed: K 4.1 wnl, P 1.0(L), Mg 1.5(L)  Diet Order:   Diet Order            Diet 2 gram sodium Room service appropriate? Yes; Fluid consistency: Thin  Diet effective now             EDUCATION NEEDS:   No education needs have been identified at this time  Skin:  Skin Assessment: Reviewed RN Assessment(ecchymosis)  Last BM:  2/17  Height:   Ht Readings from Last 1 Encounters:  12/27/19 _1  (1.6 m)    Weight:   Wt Readings from Last 1 Encounters:  01/01/20 68.7 kg    Ideal Body Weight:  52.3 kg  BMI:  Body mass index is 26.84 kg/m.  Estimated Nutritional Needs:   Kcal:  1400-1600kcal/day  Protein:  70-80g/day  Fluid:  >1.3L/day  Koleen Distance MS, RD, LDN Contact information available in Amion

## 2020-01-01 NOTE — Progress Notes (Signed)
CRITICAL VALUE ALERT  Critical Value:  Phosphorus 1.0  Date & Time Notied:  01/01/2020 12:20  Provider Notified: Dr. Marylu Lund    Orders Received/Actions taken: awaiting orders

## 2020-01-01 NOTE — Progress Notes (Signed)
MD aware-  HR increases with activity

## 2020-01-02 LAB — SURGICAL PATHOLOGY

## 2020-01-03 ENCOUNTER — Encounter: Payer: Self-pay | Admitting: Gastroenterology

## 2020-01-30 ENCOUNTER — Encounter: Payer: Self-pay | Admitting: Gastroenterology

## 2020-01-30 ENCOUNTER — Ambulatory Visit: Payer: Medicare HMO | Admitting: Gastroenterology

## 2020-02-13 ENCOUNTER — Other Ambulatory Visit: Payer: Self-pay

## 2020-02-13 ENCOUNTER — Ambulatory Visit: Payer: Medicare HMO | Admitting: Podiatry

## 2020-02-13 ENCOUNTER — Encounter: Payer: Self-pay | Admitting: Podiatry

## 2020-02-13 DIAGNOSIS — B351 Tinea unguium: Secondary | ICD-10-CM

## 2020-02-13 DIAGNOSIS — M79676 Pain in unspecified toe(s): Secondary | ICD-10-CM

## 2020-02-13 NOTE — Progress Notes (Signed)
Subjective:  Patient ID: Maude Leriche, female    DOB: September 27, 1936,  MRN: [id number] HPI Chief Complaint  Patient presents with  . Debridement    Requesting nail trim   . New Patient (Initial Visit)    84 y.o. female presents with the above complaint.   ROS: Denies fever chills nausea vomiting muscle aches and pains.  Past Medical History:  Diagnosis Date  . Fall    RISK  . Hypertension   . Vertigo    Past Surgical History:  Procedure Laterality Date  . CATARACT EXTRACTION W/PHACO Left 12/28/2018   Procedure: CATARACT EXTRACTION PHACO AND INTRAOCULAR LENS PLACEMENT (IOC) LEFT;  Surgeon: Elliot Cousin, MD;  Location: [facility] ORS;  Service: Ophthalmology;  Laterality: Left;  [place]   01:35 CDE 15.57 Fluid pack lot # [id number]  . ESOPHAGOGASTRODUODENOSCOPY (EGD) WITH PROPOFOL N/A 12/27/2019   Procedure: ESOPHAGOGASTRODUODENOSCOPY (EGD) WITH PROPOFOL;  Surgeon: Wyline Mood, MD;  Location: [facility] ENDOSCOPY;  Service: Gastroenterology;  Laterality: N/A;  . TUBAL LIGATION      Current Outpatient Medications:  .  acetaminophen (TYLENOL) 325 MG tablet, Take 325-650 mg by mouth every 6 (six) hours as needed for moderate pain or headache. , Disp: , Rfl:  .  amLODipine (NORVASC) 2.5 MG tablet, Take 1 tablet (2.5 mg total) by mouth daily., Disp: 30 tablet, Rfl: 0 .  atorvastatin (LIPITOR) 40 MG tablet, , Disp: , Rfl:  .  cholecalciferol (VITAMIN D3) 25 MCG (1000 UNIT) tablet, Take 1,000 Units by mouth daily., Disp: , Rfl:  .  dorzolamide-timolol (COSOPT) 22.3-6.8 MG/ML ophthalmic solution, Place 1 drop into both eyes 2 (two) times daily., Disp: , Rfl:  .  LUMIGAN 0.01 % SOLN, Place 1 drop into both eyes at bedtime., Disp: , Rfl:  .  magnesium oxide (MAG-OX) 400 (241.3 Mg) MG tablet, Take 2 tablets (800 mg total) by mouth daily., Disp: 7 tablet, Rfl: 0 .  meclizine (ANTIVERT) 12.5 MG tablet, Take 1 tablet (12.5 mg total) by mouth 3 (three) times daily as needed for dizziness (Or vertigo)., Disp: 30  tablet, Rfl: 0 .  Multiple Vitamin (MULTIVITAMIN WITH MINERALS) TABS tablet, Take 1 tablet by mouth daily., Disp: 30 tablet, Rfl: 0 .  pantoprazole (PROTONIX) 40 MG tablet, Take 1 tablet (40 mg total) by mouth daily., Disp: 30 tablet, Rfl: 0 .  phosphorus (K PHOS NEUTRAL) 155-852-130 MG tablet, Take 2 tablets (500 mg total) by mouth 2 (two) times daily., Disp: 14 tablet, Rfl: 0  Allergies  Allergen Reactions  . Sulfa Antibiotics Swelling    Pt was told it caused her brain to swell - unc documented in 2014, pt is unaware of this   . Acetazolamide Nausea And Vomiting    Dizziness    Review of Systems Objective:  There were no vitals filed for this visit.  General: Well developed, nourished, in no acute distress, alert and oriented x3   Dermatological: Skin is warm, dry and supple bilateral. Nails x 10 are thick yellow dystrophic clinically mycotic painful palpation as well as debridement.; remaining integument appears unremarkable at this time. There are no open sores, no preulcerative lesions, no rash or signs of infection present.  Vascular: Dorsalis Pedis artery and Posterior Tibial artery pedal pulses are 2/4 bilateral with immedate capillary fill time. Pedal hair growth present. No varicosities and no lower extremity edema present bilateral.   Neruologic: Grossly intact via light touch bilateral. Vibratory intact via tuning fork bilateral. Protective threshold with Semmes Wienstein monofilament intact to all pedal  sites bilateral. Patellar and Achilles deep tendon reflexes 2+ bilateral. No Babinski or clonus noted bilateral.   Musculoskeletal: No gross boney pedal deformities bilateral. No pain, crepitus, or limitation noted with foot and ankle range of motion bilateral. Muscular strength 5/5 in all groups tested bilateral.  Gait: Unassisted, Nonantalgic.    Radiographs:  None taken  Assessment & Plan:   Assessment: Pain in limb secondary to onychomycosis  Plan: Debrided with  toenails 1 through 5 bilateral     Shakirah Kirkey T. [place], [place]

## 2020-05-14 ENCOUNTER — Encounter: Payer: Self-pay | Admitting: *Deleted

## 2020-05-15 ENCOUNTER — Ambulatory Visit: Payer: Medicare HMO | Admitting: Podiatry

## 2020-05-26 ENCOUNTER — Encounter: Payer: Self-pay | Admitting: Cardiology

## 2020-05-26 ENCOUNTER — Other Ambulatory Visit: Payer: Self-pay

## 2020-05-26 ENCOUNTER — Ambulatory Visit: Payer: Medicare HMO | Admitting: Cardiology

## 2020-05-26 VITALS — BP 110/60 | HR 95 | Ht 63.0 in | Wt 116.5 lb

## 2020-05-26 DIAGNOSIS — I4819 Other persistent atrial fibrillation: Secondary | ICD-10-CM

## 2020-05-26 DIAGNOSIS — E7800 Pure hypercholesterolemia, unspecified: Secondary | ICD-10-CM | POA: Diagnosis not present

## 2020-05-26 DIAGNOSIS — R600 Localized edema: Secondary | ICD-10-CM

## 2020-05-26 DIAGNOSIS — I10 Essential (primary) hypertension: Secondary | ICD-10-CM | POA: Diagnosis not present

## 2020-05-26 NOTE — Patient Instructions (Signed)
Medication Instructions:   Your physician recommends that you continue on your current medications as directed. Please refer to the Current Medication list given to you today.  *If you need a refill on your cardiac medications before your next appointment, please call your pharmacy*   Lab Work: None Ordered If you have labs (blood work) drawn today and your tests are completely normal, you will receive your results only by: . MyChart Message (if you have MyChart) OR . A paper copy in the mail If you have any lab test that is abnormal or we need to change your treatment, we will call you to review the results.   Testing/Procedures:  Your physician has requested that you have an echocardiogram. Echocardiography is a painless test that uses sound waves to create images of your heart. It provides your doctor with information about the size and shape of your heart and how well your heart's chambers and valves are working. This procedure takes approximately one hour. There are no restrictions for this procedure.    Follow-Up: At [facility], you and your health needs are our priority.  As part of our continuing mission to provide you with exceptional heart care, we have created designated Provider Care Teams.  These Care Teams include your primary Cardiologist (physician) and Advanced Practice Providers (APPs -  Physician Assistants and Nurse Practitioners) who all work together to provide you with the care you need, when you need it.  We recommend signing up for the patient portal called "MyChart".  Sign up information is provided on this After Visit Summary.  MyChart is used to connect with patients for Virtual Visits (Telemedicine).  Patients are able to view lab/test results, encounter notes, upcoming appointments, etc.  Non-urgent messages can be sent to your provider as well.   To learn more about what you can do with MyChart, go to [email or web address].    Your next appointment:     Follow up after Echo   The format for your next appointment:   In Person  Provider:   Brian Agbor-Etang, MD   Other Instructions   Echocardiogram An echocardiogram is a procedure that uses painless sound waves (ultrasound) to produce an image of the heart. Images from an echocardiogram can provide important information about:  Signs of coronary artery disease (CAD).  Aneurysm detection. An aneurysm is a weak or damaged part of an artery wall that bulges out from the normal force of blood pumping through the body.  Heart size and shape. Changes in the size or shape of the heart can be associated with certain conditions, including heart failure, aneurysm, and CAD.  Heart muscle function.  Heart valve function.  Signs of a past heart attack.  Fluid buildup around the heart.  Thickening of the heart muscle.  A tumor or infectious growth around the heart valves. Tell a health care provider about:  Any allergies you have.  All medicines you are taking, including vitamins, herbs, eye drops, creams, and over-the-counter medicines.  Any blood disorders you have.  Any surgeries you have had.  Any medical conditions you have.  Whether you are pregnant or may be pregnant. What are the risks? Generally, this is a safe procedure. However, problems may occur, including:  Allergic reaction to dye (contrast) that may be used during the procedure. What happens before the procedure? No specific preparation is needed. You may eat and drink normally. What happens during the procedure?   An IV tube may be inserted into one of   your veins.  You may receive contrast through this tube. A contrast is an injection that improves the quality of the pictures from your heart.  A gel will be applied to your chest.  A wand-like tool (transducer) will be moved over your chest. The gel will help to transmit the sound waves from the transducer.  The sound waves will harmlessly bounce off  of your heart to allow the heart images to be captured in real-time motion. The images will be recorded on a computer. The procedure may vary among health care providers and hospitals. What happens after the procedure?  You may return to your normal, everyday life, including diet, activities, and medicines, unless your health care provider tells you not to do that. Summary  An echocardiogram is a procedure that uses painless sound waves (ultrasound) to produce an image of the heart.  Images from an echocardiogram can provide important information about the size and shape of your heart, heart muscle function, heart valve function, and fluid buildup around your heart.  You do not need to do anything to prepare before this procedure. You may eat and drink normally.  After the echocardiogram is completed, you may return to your normal, everyday life, unless your health care provider tells you not to do that. This information is not intended to replace advice given to you by your health care provider. Make sure you discuss any questions you have with your health care provider. Document Revised: 02/15/2019 Document Reviewed: 11/27/2016 Elsevier Patient Education  2020 [employer]   

## 2020-05-26 NOTE — Progress Notes (Signed)
Cardiology Office Note:    Date:  05/26/2020   ID:  Kristen Richard, DOB August 19, 1936, MRN [id number]  PCP:  Nicholaus Corolla, MD  [facility] Cardiologist:  Debbe Odea, MD  [facility] Electrophysiologist:  None   Referring MD: Lorn Junes, [name]   Chief Complaint  Patient presents with  . New Patient (Initial Visit)    Ref by [name], [name] for paroxysmal a-fib. Meds reviewed by the pt. verbally. Pt. c/o shortness of breath and chest pain.      History of Present Illness:    Kristen Richard is a 84 y.o. female with a hx of hypertension, persistent A. fib who presents due to history of atrial fibrillation.  Patient recently admitted on 12/2019 with AKI on and bradycardia with 3-second pauses, lisinopril and Lopressor was stopped on discharge due to sinus pauses.  Patient states having irregular heartbeats in the past.  Denies any recent palpitations.  Denies any history of bleeding or taking blood thinners.  She endorses frequent falls, last fall was last week.  Per daughter in the room, patient falls multiple times a year.  She takes Lasix 20 mg as needed for lower extremity edema.  She uses a recliner at home to prop up her leg.  She otherwise states feeling fair.  Last echocardiogram on 10/2019 showed normal systolic function, EF 60 to 65%.  Mild MR.  Trivial AR.  Mild LA dilatation.  Past Medical History:  Diagnosis Date  . DDD (degenerative disc disease), lumbar   . Fall    RISK  . GERD (gastroesophageal reflux disease)   . 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 Hypertension   . Intermittent atrial fibrillation (HCC)   . Scoliosis   . Vertigo     Past Surgical History:  Procedure Laterality Date  . CATARACT EXTRACTION W/PHACO Left 12/28/2018   Procedure: CATARACT EXTRACTION PHACO AND INTRAOCULAR LENS PLACEMENT (IOC) LEFT;  Surgeon: Elliot Cousin, MD;  Location: [facility] ORS;  Service: Ophthalmology;  Laterality: Left;  [place]   01:35 CDE 15.57 Fluid pack lot #  [id number]  . ESOPHAGOGASTRODUODENOSCOPY (EGD) WITH PROPOFOL N/A 12/27/2019   Procedure: ESOPHAGOGASTRODUODENOSCOPY (EGD) WITH PROPOFOL;  Surgeon: Wyline Mood, MD;  Location: [facility] ENDOSCOPY;  Service: Gastroenterology;  Laterality: N/A;  . TUBAL LIGATION      Current Medications: Current Meds  Medication Sig  . acetaminophen (TYLENOL) 325 MG tablet Take 325-650 mg by mouth every 6 (six) hours as needed for moderate pain or headache.   Marland Kitchen atorvastatin (LIPITOR) 40 MG tablet   . cholecalciferol (VITAMIN D3) 25 MCG (1000 UNIT) tablet Take 1,000 Units by mouth daily.  . dorzolamide-timolol (COSOPT) 22.3-6.8 MG/ML ophthalmic solution Place 1 drop into both eyes 2 (two) times daily.  . furosemide (LASIX) 20 MG tablet Take 20 mg by mouth daily as needed.   Marland Kitchen LUMIGAN 0.01 % SOLN Place 1 drop into both eyes at bedtime.  . magnesium oxide (MAG-OX) 400 (241.3 Mg) MG tablet Take 2 tablets (800 mg total) by mouth daily.  . meclizine (ANTIVERT) 12.5 MG tablet Take 1 tablet (12.5 mg total) by mouth 3 (three) times daily as needed for dizziness (Or vertigo).  . Multiple Vitamin (MULTIVITAMIN WITH MINERALS) TABS tablet Take 1 tablet by mouth daily.  . pantoprazole (PROTONIX) 40 MG tablet Take 1 tablet (40 mg total) by mouth daily.  . phosphorus (K PHOS NEUTRAL) 155-852-130 MG tablet Take 2 tablets (500 mg total) by mouth 2 (two) times daily.  Allergies:   Sulfa antibiotics, Acetazolamide, and Hydrochlorothiazide   Social History   Socioeconomic History  . Marital status: Widowed    Spouse name: Not on file  . Number of children: Not on file  . Years of education: Not on file  . Highest education level: Not on file  Occupational History  . Not on file  Tobacco Use  . Smoking status: Former Smoker    Quit date: 11/08/2002    Years since quitting: 17.5  . Smokeless tobacco: Former Clinical biochemistUser  Vaping Use  . Vaping Use: Never used  Substance and Sexual Activity  . Alcohol use: Not Currently  . Drug use:  Never  . Sexual activity: Not on file  Other Topics Concern  . Not on file  Social History Narrative  . Not on file   Social Determinants of Health   Financial Resource Strain:   . Difficulty of Paying Living Expenses:   Food Insecurity:   . Worried About Programme researcher, broadcasting/film/videounning Out of Food in the Last Year:   . Baristaan Out of Food in the Last Year:   Transportation Needs:   . Freight forwarderLack of Transportation (Medical):   Marland Kitchen. Lack of Transportation (Non-Medical):   Physical Activity:   . Days of Exercise per Week:   . Minutes of Exercise per Session:   Stress:   . Feeling of Stress :   Social Connections:   . Frequency of Communication with Friends and Family:   . Frequency of Social Gatherings with Friends and Family:   . Attends Religious Services:   . Active Member of Clubs or Organizations:   . Attends BankerClub or Organization Meetings:   Marland Kitchen. Marital Status:      Family History: The patient's family history includes Diabetes in her brother and sister; Heart attack in her father.  ROS:   Please see the history of present illness.     All other systems reviewed and are negative.  EKGs/Labs/Other Studies Reviewed:    The following studies were reviewed today:   EKG:  EKG is  ordered today.  The ekg ordered today demonstrates atrial fibrillation, heart rate 95 bpm  Recent Labs: 12/25/2019: ALT 13 12/26/2019: Hemoglobin 12.6; Platelets 208 12/30/2019: BUN 11; Creatinine, Ser 0.78; Sodium 138 01/01/2020: Magnesium 1.5; Potassium 4.9  Recent Lipid Panel    Component Value Date/Time   CHOL 149 10/27/2019 0440   TRIG 65 10/27/2019 0440   HDL 64 10/27/2019 0440   CHOLHDL 2.3 10/27/2019 0440   VLDL 13 10/27/2019 0440   LDLCALC 72 10/27/2019 0440    Physical Exam:    VS:  BP 110/60 (BP Location: Right Arm, Patient Position: Sitting, Cuff Size: Normal)   Pulse 95   Ht 5\' 3"  (1.6 m)   Wt 116 lb 8 oz (52.8 kg)   SpO2 96%   BMI 20.64 kg/m     Wt Readings from Last 3 Encounters:  05/26/20 116 lb 8  oz (52.8 kg)  01/01/20 151 lb 8 oz (68.7 kg)  10/26/19 192 lb 0.3 oz (87.1 kg)     GEN:  Well nourished, well developed in no acute distress HEENT: Normal NECK: No JVD; No carotid bruits LYMPHATICS: No lymphadenopathy CARDIAC: Irregular irregular, no murmurs RESPIRATORY:  Clear to auscultation without rales, wheezing or rhonchi  ABDOMEN: Soft, non-tender, non-distended MUSCULOSKELETAL:  1+ edema; No deformity  SKIN: Warm and dry NEUROLOGIC:  Alert and oriented x 3 PSYCHIATRIC:  Normal affect   ASSESSMENT:    1. Persistent atrial fibrillation (  HCC)   2. Pure hypercholesterolemia   3. Essential hypertension   4. Leg edema    PLAN:    In order of problems listed above:  1. Patient with persistent atrial fibrillation.  CHA2DS2-VASc score of 4(age, htn, gender).  Previously on beta-blocker/Lopressor which caused sinus pauses hence beta-blocker was stopped.  Due to frequent falls, will recommend against anticoagulation.  Heart rate controlled.  Get echocardiogram 2. History of hyperlipidemia, continue statin. 3. History of hypertension, blood pressure controlled off blood pressure meds.  Continue to monitor. 4. Lateral lower extremity edema, continue Lasix as needed.  Conservative measures such as raising legs while in a seated position, compression stockings advised.  Follow-up after echocardiogram.  This note was generated in part or whole with voice recognition software. Voice recognition is usually quite accurate but there are transcription errors that can and very often do occur. I apologize for any typographical errors that were not detected and corrected.  Medication Adjustments/Labs and Tests Ordered: Current medicines are reviewed at length with the patient today.  Concerns regarding medicines are outlined above.  Orders Placed This Encounter  Procedures  . EKG 12-Lead  . ECHOCARDIOGRAM COMPLETE   No orders of the defined types were placed in this  encounter.   Patient Instructions  Medication Instructions:   Your physician recommends that you continue on your current medications as directed. Please refer to the Current Medication list given to you today.  *If you need a refill on your cardiac medications before your next appointment, please call your pharmacy*   Lab Work: None Ordered If you have labs (blood work) drawn today and your tests are completely normal, you will receive your results only by: Marland Kitchen MyChart Message (if you have MyChart) OR . A paper copy in the mail If you have any lab test that is abnormal or we need to change your treatment, we will call you to review the results.   Testing/Procedures:  Your physician has requested that you have an echocardiogram. Echocardiography is a painless test that uses sound waves to create images of your heart. It provides your doctor with information about the size and shape of your heart and how well your heart's chambers and valves are working. This procedure takes approximately one hour. There are no restrictions for this procedure.    Follow-Up: At [facility], you and your health needs are our priority.  As part of our continuing mission to provide you with exceptional heart care, we have created designated Provider Care Teams.  These Care Teams include your primary Cardiologist (physician) and Advanced Practice Providers (APPs -  Physician Assistants and Nurse Practitioners) who all work together to provide you with the care you need, when you need it.  We recommend signing up for the patient portal called "MyChart".  Sign up information is provided on this After Visit Summary.  MyChart is used to connect with patients for Virtual Visits (Telemedicine).  Patients are able to view lab/test results, encounter notes, upcoming appointments, etc.  Non-urgent messages can be sent to your provider as well.   To learn more about what you can do with MyChart, go to  [email or web address].    Your next appointment:   Follow up after Echo    The format for your next appointment:   In Person  Provider:   Debbe Odea, MD   Other Instructions   Echocardiogram An echocardiogram is a procedure that uses painless sound waves (ultrasound) to produce an image of the heart.  Images from an echocardiogram can provide important information about:  Signs of coronary artery disease (CAD).  Aneurysm detection. An aneurysm is a weak or damaged part of an artery wall that bulges out from the normal force of blood pumping through the body.  Heart size and shape. Changes in the size or shape of the heart can be associated with certain conditions, including heart failure, aneurysm, and CAD.  Heart muscle function.  Heart valve function.  Signs of a past heart attack.  Fluid buildup around the heart.  Thickening of the heart muscle.  A tumor or infectious growth around the heart valves. Tell a health care provider about:  Any allergies you have.  All medicines you are taking, including vitamins, herbs, eye drops, creams, and over-the-counter medicines.  Any blood disorders you have.  Any surgeries you have had.  Any medical conditions you have.  Whether you are pregnant or may be pregnant. What are the risks? Generally, this is a safe procedure. However, problems may occur, including:  Allergic reaction to dye (contrast) that may be used during the procedure. What happens before the procedure? No specific preparation is needed. You may eat and drink normally. What happens during the procedure?   An IV tube may be inserted into one of your veins.  You may receive contrast through this tube. A contrast is an injection that improves the quality of the pictures from your heart.  A gel will be applied to your chest.  A wand-like tool (transducer) will be moved over your chest. The gel will help to transmit the sound waves from the  transducer.  The sound waves will harmlessly bounce off of your heart to allow the heart images to be captured in real-time motion. The images will be recorded on a computer. The procedure may vary among health care providers and hospitals. What happens after the procedure?  You may return to your normal, everyday life, including diet, activities, and medicines, unless your health care provider tells you not to do that. Summary  An echocardiogram is a procedure that uses painless sound waves (ultrasound) to produce an image of the heart.  Images from an echocardiogram can provide important information about the size and shape of your heart, heart muscle function, heart valve function, and fluid buildup around your heart.  You do not need to do anything to prepare before this procedure. You may eat and drink normally.  After the echocardiogram is completed, you may return to your normal, everyday life, unless your health care provider tells you not to do that. This information is not intended to replace advice given to you by your health care provider. Make sure you discuss any questions you have with your health care provider. Document Revised: 02/15/2019 Document Reviewed: 11/27/2016 Elsevier Patient Education  2020 [employer].      Signed, Debbe Odea, MD  05/26/2020 5:32 PM    [facility]

## 2020-06-02 ENCOUNTER — Other Ambulatory Visit: Payer: Self-pay

## 2020-06-02 ENCOUNTER — Emergency Department: Payer: Medicare HMO

## 2020-06-02 ENCOUNTER — Inpatient Hospital Stay
Admission: EM | Admit: 2020-06-02 | Discharge: 2020-06-09 | DRG: 445 | Disposition: A | Discharge status: Home Health | Payer: Medicare HMO | Attending: Internal Medicine | Admitting: Internal Medicine

## 2020-06-02 ENCOUNTER — Encounter: Payer: Self-pay | Admitting: Emergency Medicine

## 2020-06-02 DIAGNOSIS — E860 Dehydration: Secondary | ICD-10-CM | POA: Diagnosis present

## 2020-06-02 DIAGNOSIS — N182 Chronic kidney disease, stage 2 (mild): Secondary | ICD-10-CM

## 2020-06-02 DIAGNOSIS — K200 Eosinophilic esophagitis: Secondary | ICD-10-CM | POA: Diagnosis present

## 2020-06-02 DIAGNOSIS — R1110 Vomiting, unspecified: Secondary | ICD-10-CM | POA: Diagnosis not present

## 2020-06-02 DIAGNOSIS — N179 Acute kidney failure, unspecified: Secondary | ICD-10-CM | POA: Diagnosis not present

## 2020-06-02 DIAGNOSIS — K449 Diaphragmatic hernia without obstruction or gangrene: Secondary | ICD-10-CM | POA: Diagnosis present

## 2020-06-02 DIAGNOSIS — E875 Hyperkalemia: Secondary | ICD-10-CM | POA: Diagnosis not present

## 2020-06-02 DIAGNOSIS — E876 Hypokalemia: Secondary | ICD-10-CM | POA: Diagnosis not present

## 2020-06-02 DIAGNOSIS — W19XXXA Unspecified fall, initial encounter: Secondary | ICD-10-CM | POA: Diagnosis not present

## 2020-06-02 DIAGNOSIS — Z79899 Other long term (current) drug therapy: Secondary | ICD-10-CM

## 2020-06-02 DIAGNOSIS — E872 Acidosis: Secondary | ICD-10-CM | POA: Diagnosis not present

## 2020-06-02 DIAGNOSIS — E785 Hyperlipidemia, unspecified: Secondary | ICD-10-CM | POA: Diagnosis present

## 2020-06-02 DIAGNOSIS — R296 Repeated falls: Secondary | ICD-10-CM | POA: Diagnosis present

## 2020-06-02 DIAGNOSIS — I248 Other forms of acute ischemic heart disease: Secondary | ICD-10-CM | POA: Diagnosis present

## 2020-06-02 DIAGNOSIS — Z87891 Personal history of nicotine dependence: Secondary | ICD-10-CM | POA: Diagnosis not present

## 2020-06-02 DIAGNOSIS — K219 Gastro-esophageal reflux disease without esophagitis: Secondary | ICD-10-CM | POA: Diagnosis present

## 2020-06-02 DIAGNOSIS — I4819 Other persistent atrial fibrillation: Secondary | ICD-10-CM | POA: Diagnosis present

## 2020-06-02 DIAGNOSIS — Z8249 Family history of ischemic heart disease and other diseases of the circulatory system: Secondary | ICD-10-CM

## 2020-06-02 DIAGNOSIS — Z20822 Contact with and (suspected) exposure to covid-19: Secondary | ICD-10-CM | POA: Diagnosis present

## 2020-06-02 DIAGNOSIS — E8729 Other acidosis: Secondary | ICD-10-CM

## 2020-06-02 DIAGNOSIS — K222 Esophageal obstruction: Secondary | ICD-10-CM | POA: Diagnosis present

## 2020-06-02 DIAGNOSIS — K2970 Gastritis, unspecified, without bleeding: Secondary | ICD-10-CM | POA: Diagnosis present

## 2020-06-02 DIAGNOSIS — E878 Other disorders of electrolyte and fluid balance, not elsewhere classified: Secondary | ICD-10-CM | POA: Diagnosis present

## 2020-06-02 DIAGNOSIS — D649 Anemia, unspecified: Secondary | ICD-10-CM | POA: Diagnosis present

## 2020-06-02 DIAGNOSIS — Z833 Family history of diabetes mellitus: Secondary | ICD-10-CM

## 2020-06-02 DIAGNOSIS — L8996 Pressure-induced deep tissue damage of unspecified site: Secondary | ICD-10-CM

## 2020-06-02 DIAGNOSIS — E162 Hypoglycemia, unspecified: Secondary | ICD-10-CM | POA: Diagnosis present

## 2020-06-02 DIAGNOSIS — I10 Essential (primary) hypertension: Secondary | ICD-10-CM | POA: Diagnosis not present

## 2020-06-02 DIAGNOSIS — K8020 Calculus of gallbladder without cholecystitis without obstruction: Secondary | ICD-10-CM | POA: Diagnosis present

## 2020-06-02 DIAGNOSIS — R600 Localized edema: Secondary | ICD-10-CM | POA: Diagnosis present

## 2020-06-02 LAB — COMPREHENSIVE METABOLIC PANEL
ALT: 17 U/L (ref 0–44)
AST: 43 U/L — ABNORMAL HIGH (ref 15–41)
Albumin: 3.1 g/dL — ABNORMAL LOW (ref 3.5–5.0)
Alkaline Phosphatase: 68 U/L (ref 38–126)
Anion gap: 21 — ABNORMAL HIGH (ref 5–15)
BUN: 20 mg/dL (ref 8–23)
CO2: 34 mmol/L — ABNORMAL HIGH (ref 22–32)
Calcium: 7.9 mg/dL — ABNORMAL LOW (ref 8.9–10.3)
Chloride: 81 mmol/L — ABNORMAL LOW (ref 98–111)
Creatinine, Ser: 1 mg/dL (ref 0.44–1.00)
GFR calc Af Amer: >60 mL/min (ref >60)
GFR calc non Af Amer: 53 mL/min — ABNORMAL LOW (ref >60)
Glucose, Bld: 66 mg/dL — ABNORMAL LOW (ref 70–99)
Potassium: <2 mmol/L — CL (ref 3.5–5.1)
Sodium: 136 mmol/L (ref 135–145)
Total Bilirubin: 2.6 mg/dL — ABNORMAL HIGH (ref 0.3–1.2)
Total Protein: 6.9 g/dL (ref 6.5–8.1)

## 2020-06-02 LAB — TROPONIN I (HIGH SENSITIVITY)
Troponin I (High Sensitivity): 28 ng/L — ABNORMAL HIGH (ref <18)
Troponin I (High Sensitivity): 34 ng/L — ABNORMAL HIGH (ref <18)

## 2020-06-02 LAB — CBC
HCT: 39.3 % (ref 36.0–46.0)
Hemoglobin: 13.3 g/dL (ref 12.0–15.0)
MCH: 29.6 pg (ref 26.0–34.0)
MCHC: 33.8 g/dL (ref 30.0–36.0)
MCV: 87.5 fL (ref 80.0–100.0)
Platelets: 271 10*3/uL (ref 150–400)
RBC: 4.49 MIL/uL (ref 3.87–5.11)
RDW: 15.4 % (ref 11.5–15.5)
WBC: 9.6 10*3/uL (ref 4.0–10.5)
nRBC: 0 % (ref 0.0–0.2)

## 2020-06-02 LAB — SARS CORONAVIRUS 2 BY RT PCR (HOSPITAL ORDER, PERFORMED IN ~~LOC~~ HOSPITAL LAB): SARS Coronavirus 2: NEGATIVE

## 2020-06-02 LAB — MAGNESIUM: Magnesium: 1.3 mg/dL — ABNORMAL LOW (ref 1.7–2.4)

## 2020-06-02 LAB — LIPASE, BLOOD: Lipase: 26 U/L (ref 11–51)

## 2020-06-02 LAB — GLUCOSE, CAPILLARY: Glucose-Capillary: 60 mg/dL — ABNORMAL LOW (ref 70–99)

## 2020-06-02 MED ORDER — POTASSIUM CHLORIDE 2 MEQ/ML IV SOLN
INTRAVENOUS | Status: AC
  Filled 2020-06-02: qty 1000

## 2020-06-02 MED ORDER — LACTATED RINGERS IV BOLUS
1000.0000 mL | Freq: Once | INTRAVENOUS | Status: AC
  Administered 2020-06-02: 1000 mL via INTRAVENOUS

## 2020-06-02 MED ORDER — ONDANSETRON HCL 4 MG/2ML IJ SOLN
4.0000 mg | Freq: Four times a day (QID) | INTRAMUSCULAR | Status: DC | PRN
  Administered 2020-06-07 – 2020-06-08 (×2): 4 mg via INTRAVENOUS
  Filled 2020-06-02 (×2): qty 2

## 2020-06-02 MED ORDER — MAGNESIUM SULFATE 2 GM/50ML IV SOLN
2.0000 g | INTRAVENOUS | Status: AC
  Administered 2020-06-02: 2 g via INTRAVENOUS
  Filled 2020-06-02: qty 50

## 2020-06-02 MED ORDER — ONDANSETRON 4 MG PO TBDP
4.0000 mg | ORAL_TABLET | Freq: Three times a day (TID) | ORAL | Status: DC | PRN
  Filled 2020-06-02: qty 1

## 2020-06-02 MED ORDER — POLYETHYLENE GLYCOL 3350 17 G PO PACK: 17.0000 g | PACK | Freq: Two times a day (BID) | ORAL | Status: DC | PRN

## 2020-06-02 MED ORDER — POTASSIUM CHLORIDE 10 MEQ/100ML IV SOLN
10.0000 meq | INTRAVENOUS | Status: AC
  Administered 2020-06-02 (×2): 10 meq via INTRAVENOUS
  Filled 2020-06-02 (×2): qty 100

## 2020-06-02 MED ORDER — DEXTROSE 50 % IV SOLN
12.5000 g | INTRAVENOUS | Status: AC
  Administered 2020-06-02: 12.5 g via INTRAVENOUS
  Filled 2020-06-02: qty 50

## 2020-06-02 MED ORDER — ACETAMINOPHEN 500 MG PO TABS
1000.0000 mg | ORAL_TABLET | Freq: Three times a day (TID) | ORAL | Status: DC | PRN
  Administered 2020-06-03 – 2020-06-08 (×2): 1000 mg via ORAL
  Filled 2020-06-02 (×4): qty 2

## 2020-06-02 MED ORDER — PANTOPRAZOLE SODIUM 40 MG PO TBEC
40.0000 mg | DELAYED_RELEASE_TABLET | Freq: Every day | ORAL | Status: DC
  Administered 2020-06-03 – 2020-06-09 (×7): 40 mg via ORAL
  Filled 2020-06-02 (×7): qty 1

## 2020-06-02 MED ORDER — ATORVASTATIN CALCIUM 20 MG PO TABS
40.0000 mg | ORAL_TABLET | Freq: Every day | ORAL | Status: DC
  Administered 2020-06-03 – 2020-06-09 (×7): 40 mg via ORAL
  Filled 2020-06-02 (×7): qty 2

## 2020-06-02 MED ORDER — ENOXAPARIN SODIUM 40 MG/0.4ML ~~LOC~~ SOLN
40.0000 mg | SUBCUTANEOUS | Status: DC
  Administered 2020-06-03 – 2020-06-08 (×6): 40 mg via SUBCUTANEOUS
  Filled 2020-06-02 (×5): qty 0.4

## 2020-06-02 MED ORDER — DOCUSATE SODIUM 100 MG PO CAPS: 100.0000 mg | ORAL_CAPSULE | Freq: Two times a day (BID) | ORAL | Status: DC | PRN

## 2020-06-02 MED ORDER — CALCIUM CARBONATE ANTACID 500 MG PO CHEW: 1.0000 | CHEWABLE_TABLET | Freq: Three times a day (TID) | ORAL | Status: DC | PRN

## 2020-06-02 MED ORDER — GUAIFENESIN-DM 100-10 MG/5ML PO SYRP
10.0000 mL | ORAL_SOLUTION | Freq: Four times a day (QID) | ORAL | Status: DC | PRN
  Administered 2020-06-07: 05:00:00 10 mL via ORAL
  Filled 2020-06-02 (×2): qty 10

## 2020-06-02 MED ORDER — ALUM & MAG HYDROXIDE-SIMETH 200-200-20 MG/5ML PO SUSP: 15.0000 mL | Freq: Four times a day (QID) | ORAL | Status: DC | PRN

## 2020-06-02 MED ORDER — POTASSIUM CHLORIDE CRYS ER 20 MEQ PO TBCR: 40.0000 meq | EXTENDED_RELEASE_TABLET | ORAL | Status: DC

## 2020-06-02 NOTE — ED Provider Notes (Signed)
[facility] Emergency Department Provider Note  ____________________________________________  Time seen: Approximately 3:16 PM  I have reviewed the triage vital signs and the nursing notes.   HISTORY  Chief Complaint Abdominal Pain, Nausea, Emesis, and Diarrhea    HPI Kristen Richard is a 84 y.o. female with a history of GERD, hypertension, CKD who comes ED complaining of nausea vomiting and diarrhea for the past 2 weeks.  Symptoms are waxing and waning, worse with trying to eat.  No alleviating factors.  Associated with right upper quadrant pain is nonradiating.  Currently pain feels resolved.  Denies fever or chills.  She does get lightheaded when she stands up but denies syncope or falls.      Past Medical History:  Diagnosis Date  . DDD (degenerative disc disease), lumbar   . Fall    RISK  . GERD (gastroesophageal reflux disease)   . 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 Hypertension   . Intermittent atrial fibrillation (HCC)   . Scoliosis   . Vertigo      Patient Active Problem List   Diagnosis Date Noted  . Disorder of electrolytes 06/02/2020  . Intractable vomiting   . AKI (acute kidney injury) (HCC) 12/25/2019  . Abdominal pain 12/25/2019  . Hypertension   . Hypokalemia   . GERD (gastroesophageal reflux disease)   . Fall   . Dehydration   . Pressure injury of skin 10/27/2019  . Rhabdomyolysis 10/26/2019  . Chronic kidney disease, stage II (mild) 10/29/2013  . Hematuria 09/24/2013     Past Surgical History:  Procedure Laterality Date  . CATARACT EXTRACTION W/PHACO Left 12/28/2018   Procedure: CATARACT EXTRACTION PHACO AND INTRAOCULAR LENS PLACEMENT (IOC) LEFT;  Surgeon: Elliot Cousin, MD;  Location: [facility] ORS;  Service: Ophthalmology;  Laterality: Left;  [place]   01:35 CDE 15.57 Fluid pack lot # [id number]  . ESOPHAGOGASTRODUODENOSCOPY (EGD) WITH PROPOFOL N/A 12/27/2019   Procedure: ESOPHAGOGASTRODUODENOSCOPY (EGD) WITH  PROPOFOL;  Surgeon: Wyline Mood, MD;  Location: [facility] ENDOSCOPY;  Service: Gastroenterology;  Laterality: N/A;  . TUBAL LIGATION       Prior to Admission medications   Medication Sig Start Date End Date Taking? Authorizing Provider  acetaminophen (TYLENOL) 325 MG tablet Take 325-650 mg by mouth every 6 (six) hours as needed for moderate pain or headache.     [provider]  atorvastatin (LIPITOR) 40 MG tablet  01/19/20   [provider]  cholecalciferol (VITAMIN D3) 25 MCG (1000 UNIT) tablet Take 1,000 Units by mouth daily. 12/10/19   [provider]  dorzolamide-timolol (COSOPT) 22.3-6.8 MG/ML ophthalmic solution Place 1 drop into both eyes 2 (two) times daily. 09/25/19   [provider]  furosemide (LASIX) 20 MG tablet Take 20 mg by mouth daily as needed.  05/01/20   [provider]  LUMIGAN 0.01 % SOLN Place 1 drop into both eyes at bedtime. 09/12/19   [provider]  magnesium oxide (MAG-OX) 400 (241.3 Mg) MG tablet Take 2 tablets (800 mg total) by mouth daily. 01/01/20   Lynn Ito, MD  meclizine (ANTIVERT) 12.5 MG tablet Take 1 tablet (12.5 mg total) by mouth 3 (three) times daily as needed for dizziness (Or vertigo). 10/28/19   Tresa Moore, MD  Multiple Vitamin (MULTIVITAMIN WITH MINERALS) TABS tablet Take 1 tablet by mouth daily. 01/01/20   Lynn Ito, MD  pantoprazole (PROTONIX) 40 MG tablet Take 1 tablet (40 mg total) by mouth daily. 01/01/20   Lynn Ito, MD  phosphorus (K PHOS NEUTRAL) 155-852-130 MG tablet Take 2 tablets (500 mg total) by mouth 2 (two) times daily. 01/01/20   Lynn ItoAmery, Sahar, MD     Allergies Sulfa antibiotics, Acetazolamide, and Hydrochlorothiazide   Family History  Problem Relation Age of Onset  . Heart attack Father   . Diabetes Sister   . Diabetes Brother     Social History Social History   Tobacco Use  . Smoking status: Former Smoker    Quit date: 11/08/2002    Years since quitting: 17.5   . Smokeless tobacco: Former Clinical biochemistUser  Vaping Use  . Vaping Use: Never used  Substance Use Topics  . Alcohol use: Not Currently  . Drug use: Never    Review of Systems  Constitutional:   No fever or chills.  ENT:   No sore throat. No rhinorrhea. Cardiovascular:   No chest pain or syncope. Respiratory:   No dyspnea or cough. Gastrointestinal:   Positive as above for abdominal pain, vomiting and diarrhea.  Musculoskeletal:   Negative for focal pain or swelling All other systems reviewed and are negative except as documented above in ROS and HPI.  ____________________________________________   PHYSICAL EXAM:  VITAL SIGNS: ED Triage Vitals  Enc Vitals Group     BP 06/02/20 1326 110/74     Pulse Rate 06/02/20 1326 87     Resp 06/02/20 1326 19     Temp 06/02/20 1333 97.9 F (36.6 C)     Temp Source 06/02/20 1333 Oral     SpO2 06/02/20 1326 97 %     Weight 06/02/20 1326 116 lb (52.6 kg)     Height 06/02/20 1326 5\' 3"  (1.6 m)     Head Circumference --      Peak Flow --      Pain Score 06/02/20 1326 0     Pain Loc --      Pain Edu? --      Excl. in GC? --     Vital signs reviewed, nursing assessments reviewed.   Constitutional:   Alert and oriented. Non-toxic appearance. Eyes:   Conjunctivae are normal. EOMI. PERRL. ENT      Head:   Normocephalic and atraumatic.      Nose:   Normal.      Mouth/Throat:   Dry mucous membranes.      Neck:   No meningismus. Full ROM. Hematological/Lymphatic/Immunilogical:   No cervical lymphadenopathy. Cardiovascular:   RRR. Symmetric bilateral radial and DP pulses.  No murmurs. Cap refill less than 2 seconds. Respiratory:   Normal respiratory effort without tachypnea/retractions. Breath sounds are clear and equal bilaterally. No wheezes/rales/rhonchi. Gastrointestinal:   Soft with right upper quadrant tenderness. Non distended. There is no CVA tenderness.  No rebound, rigidity, or guarding. Musculoskeletal:   Normal range of motion in all  extremities. No joint effusions.  No lower extremity tenderness.  No edema. Neurologic:   Normal speech and language.  Motor grossly intact. No acute focal neurologic deficits are appreciated.  Skin:    Skin is warm, dry and intact. No rash noted.  No petechiae, purpura, or bullae.  ____________________________________________    LABS (pertinent positives/negatives) (all labs ordered are listed, but only abnormal results are displayed) Labs Reviewed  COMPREHENSIVE METABOLIC PANEL - Abnormal; Notable for the following components:      Result Value   Potassium <2.0 (*)    Chloride 81 (*)    CO2 34 (*)    Glucose, Bld 66 (*)  Calcium 7.9 (*)    Albumin 3.1 (*)    AST 43 (*)    Total Bilirubin 2.6 (*)    GFR calc non Af Amer 53 (*)    Anion gap 21 (*)    All other components within normal limits  MAGNESIUM - Abnormal; Notable for the following components:   Magnesium 1.3 (*)    All other components within normal limits  TROPONIN I (HIGH SENSITIVITY) - Abnormal; Notable for the following components:   Troponin I (High Sensitivity) 28 (*)    All other components within normal limits  SARS CORONAVIRUS 2 BY RT PCR (HOSPITAL ORDER, PERFORMED IN [facility])  LIPASE, BLOOD  CBC  URINALYSIS, COMPLETE (UACMP) WITH MICROSCOPIC  TROPONIN I (HIGH SENSITIVITY)   ____________________________________________   EKG  Interpreted by me Sinus rhythm rate of 87.  Normal axis.  Prolonged QTC of 600 ms.  Normal QRS ST segments and T waves.  4 PVCs on the strip.  Compared to prior EKGs, prolonged QTC appears chronic, but frequent ectopy was not previously present ____________________________________________    RADIOLOGY  US ABDOMEN LIMITED RUQ  Result Date: 06/02/2020 CLINICAL DATA:  Acute right upper quadrant abdominal pain. EXAM: ULTRASOUND ABDOMEN LIMITED RIGHT UPPER QUADRANT COMPARISON:  December 25, 2019. FINDINGS: Gallbladder: Cholelithiasis is noted without  gallbladder wall thickening or pericholecystic fluid. No sonographic Murphy's sign is noted. Common bile duct: Diameter: 4 mm which is within normal limits. Liver: No focal lesion identified. Increased echogenicity of hepatic parenchyma is noted suggesting hepatic steatosis or other diffuse hepatocellular disease. Portal vein is patent on color Doppler imaging with normal direction of blood flow towards the liver. Other: None. IMPRESSION: Cholelithiasis without evidence of cholecystitis. Increased echogenicity of hepatic parenchyma is noted suggesting hepatic steatosis or other diffuse hepatocellular disease. Electronically Signed   By: Lupita Raider M.D.   On: 06/02/2020 15:17    ____________________________________________   PROCEDURES .Critical Care Performed by: Sharman Cheek, MD Authorized by: Sharman Cheek, MD   Critical care provider statement:    Critical care time (minutes):  33   Critical care time was exclusive of:  Separately billable procedures and treating other patients   Critical care was necessary to treat or prevent imminent or life-threatening deterioration of the following conditions:  Metabolic crisis and dehydration   Critical care was time spent personally by me on the following activities:  Development of treatment plan with patient or surrogate, discussions with consultants, evaluation of patient's response to treatment, examination of patient, obtaining history from patient or surrogate, ordering and performing treatments and interventions, ordering and review of laboratory studies, ordering and review of radiographic studies, pulse oximetry, re-evaluation of patient's condition and review of old charts    ____________________________________________  DIFFERENTIAL DIAGNOSIS   Dehydration, electrolyte abnormality, cholecystitis, pancreatitis, symptomatic cholelithiasis, viral syndrome, Covid  CLINICAL IMPRESSION / ASSESSMENT AND PLAN / ED  COURSE  Medications ordered in the ED: Medications  potassium chloride 10 mEq in 100 mL IVPB (10 mEq Intravenous New Bag/Given 06/02/20 1447)  magnesium sulfate IVPB 2 g 50 mL (has no administration in time range)  lactated ringers bolus 1,000 mL (1,000 mLs Intravenous New Bag/Given 06/02/20 1452)    Pertinent labs & imaging results that were available during my care of the patient were reviewed by me and considered in my medical decision making (see chart for details).  Naja Hylton was evaluated in Emergency Department on 06/02/2020 for the symptoms described in the history of present illness.  She was evaluated in the context of the global COVID-19 pandemic, which necessitated consideration that the patient might be at risk for infection with the SARS-CoV-2 virus that causes COVID-19. Institutional protocols and algorithms that pertain to the evaluation of patients at risk for COVID-19 are in a state of rapid change based on information released by regulatory bodies including the CDC and federal and state organizations. These policies and algorithms were followed during the patient's care in the ED.   Patient presents with abdominal pain vomiting and diarrhea for the past 2 weeks.  Appears dehydrated.  Initial labs show magnesium of 1.3, potassium of less than 2.0, hypochloremia with an elevated anion gap metabolic acidosis.  She will need IV fluids for hydration, magnesium replacement, potassium replacement.  I will admit to the hospital for further management.  Ultrasound obtained which is negative for cholecystitis but does show at least 2 gallstones measuring 1.4 cm each, likely the source of her pain with eating.      ____________________________________________   FINAL CLINICAL IMPRESSION(S) / ED DIAGNOSES    Final diagnoses:  Dehydration  Metabolic acidosis, increased anion gap  Hypokalemia  Hypochloremia  Hypomagnesemia  Symptomatic cholelithiasis     ED Discharge Orders     None      Portions of this note were generated with dragon dictation software. Dictation errors may occur despite best attempts at proofreading.   Sharman Cheek, MD 06/02/20 [phone]

## 2020-06-02 NOTE — ED Triage Notes (Signed)
Pt reports upper mid abd pain and NVD for the past couple of weeks. Pt reports no pain at this time and states all x's come and go. Pt also with edema to BLE, states it is normal.

## 2020-06-02 NOTE — ED Notes (Signed)
Pt placed on cardiac monitor and purwick in place

## 2020-06-02 NOTE — H&P (Signed)
History and Physical    Kristen Richard QIH:[id number] DOB: 10-06-1936 DOA: 06/02/2020  PCP: [facility]  Patient coming from: home  I have personally briefly reviewed patient's old medical records in [facility]  Chief Complaint: spitting everything back up  HPI: Kristen Richard is a 84 y.o. female with medical history significant of afib not on anticoagulation, gastritis who presented with N/V/D and abdominal pain.  Pt reported having N/V for the past 3-4 weeks.  Just having food in her mouth and the smell of food would get her dry heaving, and after she swallows, food and drink would get vomited back up.  No blood in her vomitus.  Pt also can't take her pills anymore because they also come back up.  Pt said she does not feel hungry.  Pt reported large amount of weight loss but was not certain how much she used to weigh.  Pt reported still making urine, no dysuria, no hematuria.  Pt also had diarrhea for the past 3-4 weeks, sometimes 3-4 episodes per day, sometimes black in color.  Also reported having RUQ abdominal pain mostly when she lies down to go to sleep.  Abdominal pain not associated with oral intake.  Pt was not aware of a hx of gallbladder stone, no hx of pancreatitis, had distant hx of drinking alcohol.    Pt said she had never had EGD or colonoscopy, however, per record, pt did have EGD back in Feb 2021, when pt was admitted for N/V and dehydration.  Pt also had low potassium, mag and phos at that time.  EGD found gastritis and benign esophageal stenosis.  Protonix was recommended.  HIDA scan neg.  ED Course: initial vitals: afebrile, pulse 95, BP 110/60, sating 96% on room air.  Labs notable for normal WBC, normal Hgb, K+ <2, Mag 1.3, BG 66, lipase wnl, trop 28, US abdomen showed "Cholelithiasis without evidence of cholecystitis."  Pt received 1L LR, IV mag and IV potassium in the ED before admission.   Assessment/Plan Active Problems:   Disorder of  electrolytes  # N/V diarrhea and abdominal pain # Cholelithiasis # Hx of benign esophageal stenosis --Pt did have a hospitalization for N/V in Feb 2021, however, gallbladder was normal at that time.  Current symptoms maybe due to cholelithiasis.  Not septic, no signs of cholecystitis.   PLAN: --GenSurg consult for possible cholecystectomy since symptoms are severe --GI consult for possible repeat EGD  # Hypokalemia and Hypomag --from vomiting and diarrhea and inadequate PO intake.   PLAN: --IV mag PRN --IV potassium PRN (due to inability to keep pills down)  # Hypoglycemia --from poor PO intake and prolonged lack of nutrition PLAN: --D5 1/2 NS with K 80 mEq@100  ml/hr for 10 hours --BG q6h and hypoglycemic protocol.  # Mild trop elevation --likely due to demand ischemia  --trend to peak  # Persistent Afib not on anticoagulation --Last seen by cardiology on 05/26/20.   --CHA2DS2-VASc score of 4 (age, htn, gender).  Previously on beta-blocker/Lopressor which caused sinus pauses hence beta-blocker was stopped.  Due to frequent falls, not on anticoagulation.  # History of hyperlipidemia --continue statin.  # History of hypertension --blood pressure controlled off blood pressure meds.    # Lateral lower extremity edema --continue home Lasix as needed.   DVT prophylaxis: Lovenox SQ Code Status: Full code  Family Communication:   Disposition Plan: undetermined  Consults called: GenSurg and GI Admission status: Inpatient   Review of Systems: As per  HPI otherwise 10 point review of systems negative.   Past Medical History:  Diagnosis Date  . DDD (degenerative disc disease), lumbar   . Fall    RISK  . GERD (gastroesophageal reflux disease)   . 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 Hypertension   . Intermittent atrial fibrillation (HCC)   . Scoliosis   . Vertigo     Past Surgical History:  Procedure Laterality Date  . CATARACT EXTRACTION W/PHACO  Left 12/28/2018   Procedure: CATARACT EXTRACTION PHACO AND INTRAOCULAR LENS PLACEMENT (IOC) LEFT;  Surgeon: Elliot Cousin, MD;  Location: [facility] ORS;  Service: Ophthalmology;  Laterality: Left;  [place]   01:35 CDE 15.57 Fluid pack lot # [id number]  . ESOPHAGOGASTRODUODENOSCOPY (EGD) WITH PROPOFOL N/A 12/27/2019   Procedure: ESOPHAGOGASTRODUODENOSCOPY (EGD) WITH PROPOFOL;  Surgeon: Wyline Mood, MD;  Location: [facility] ENDOSCOPY;  Service: Gastroenterology;  Laterality: N/A;  . TUBAL LIGATION       reports that she quit smoking about 17 years ago. She has quit using smokeless tobacco. She reports previous alcohol use. She reports that she does not use drugs.  Allergies  Allergen Reactions  . Sulfa Antibiotics Swelling    Pt was told it caused her brain to swell - unc documented in 2014, pt is unaware of this   . Acetazolamide Nausea And Vomiting    Dizziness   . Hydrochlorothiazide     Family History  Problem Relation Age of Onset  . Heart attack Father   . Diabetes Sister   . Diabetes Brother      Prior to Admission medications   Medication Sig Start Date End Date Taking? Authorizing Provider  atorvastatin (LIPITOR) 40 MG tablet Take 40 mg by mouth daily.  01/19/20  Yes [provider]  cholecalciferol (VITAMIN D3) 25 MCG (1000 UNIT) tablet Take 1,000 Units by mouth daily. 12/10/19  Yes [provider]  magnesium oxide (MAG-OX) 400 (241.3 Mg) MG tablet Take 2 tablets (800 mg total) by mouth daily. 01/01/20  Yes Lynn Ito, MD  Multiple Vitamin (MULTIVITAMIN WITH MINERALS) TABS tablet Take 1 tablet by mouth daily. 01/01/20  Yes Lynn Ito, MD  nitrofurantoin (MACRODANTIN) 100 MG capsule Take 100 mg by mouth 2 (two) times daily. 05/30/20 06/03/20 Yes [provider]  pantoprazole (PROTONIX) 40 MG tablet Take 1 tablet (40 mg total) by mouth daily. 01/01/20  Yes Lynn Ito, MD  phosphorus (K PHOS NEUTRAL) 155-852-130 MG tablet Take 2 tablets (500 mg total) by mouth 2 (two)  times daily. 01/01/20  Yes Lynn Ito, MD  acetaminophen (TYLENOL) 325 MG tablet Take 325-650 mg by mouth every 6 (six) hours as needed for moderate pain or headache.     [provider]  furosemide (LASIX) 20 MG tablet Take 20 mg by mouth daily as needed.  05/01/20   [provider]  meclizine (ANTIVERT) 12.5 MG tablet Take 1 tablet (12.5 mg total) by mouth 3 (three) times daily as needed for dizziness (Or vertigo). Patient not taking: Reported on 06/02/2020 10/28/19   Tresa Moore, MD    Physical Exam: Vitals:   06/02/20 1326 06/02/20 1333  BP: 110/74   Pulse: 87   Resp: 19   Temp:  97.9 F (36.6 C)  TempSrc:  Oral  SpO2: 97%   Weight: 52.6 kg   Height: 5\' 3"  (1.6 m)     Constitutional: NAD, AAOx3 HEENT: conjunctivae and lids normal, EOMI CV: irregularly irregular. Distal pulses +2.  No cyanosis.   RESP:  CTA B/L, normal respiratory effort  GI: absent BS, ND Extremities: generalized swelling in BLE SKIN: warm, dry and intact Neuro: II - XII grossly intact.  Sensation intact Psych: Normal mood and affect.     Labs on Admission: I have personally reviewed following labs and imaging studies  CBC: Recent Labs  Lab 06/02/20 1330  WBC 9.6  HGB 13.3  HCT 39.3  MCV 87.5  PLT 271   Basic Metabolic Panel: Recent Labs  Lab 06/02/20 1330  NA 136  K <2.0*  CL 81*  CO2 34*  GLUCOSE 66*  BUN 20  CREATININE 1.00  CALCIUM 7.9*  MG 1.3*   GFR: Estimated Creatinine Clearance: 36.5 mL/min (by C-G formula based on SCr of 1 mg/dL). Liver Function Tests: Recent Labs  Lab 06/02/20 1330  AST 43*  ALT 17  ALKPHOS 68  BILITOT 2.6*  PROT 6.9  ALBUMIN 3.1*   Recent Labs  Lab 06/02/20 1330  LIPASE 26   No results for input(s): AMMONIA in the last 168 hours. Coagulation Profile: No results for input(s): INR, PROTIME in the last 168 hours. Cardiac Enzymes: No results for input(s): CKTOTAL, CKMB, CKMBINDEX, TROPONINI in the last 168 hours. BNP  (last 3 results) No results for input(s): PROBNP in the last 8760 hours. HbA1C: No results for input(s): HGBA1C in the last 72 hours. CBG: No results for input(s): GLUCAP in the last 168 hours. Lipid Profile: No results for input(s): CHOL, HDL, LDLCALC, TRIG, CHOLHDL, LDLDIRECT in the last 72 hours. Thyroid Function Tests: No results for input(s): TSH, T4TOTAL, FREET4, T3FREE, THYROIDAB in the last 72 hours. Anemia Panel: No results for input(s): VITAMINB12, FOLATE, FERRITIN, TIBC, IRON, RETICCTPCT in the last 72 hours. Urine analysis:    Component Value Date/Time   COLORURINE YELLOW (A) 10/26/2019 1809   APPEARANCEUR CLEAR (A) 10/26/2019 1809   LABSPEC 1.017 10/26/2019 1809   PHURINE 5.0 10/26/2019 1809   GLUCOSEU NEGATIVE 10/26/2019 1809   HGBUR MODERATE (A) 10/26/2019 1809   BILIRUBINUR NEGATIVE 10/26/2019 1809   KETONESUR 20 (A) 10/26/2019 1809   PROTEINUR 30 (A) 10/26/2019 1809   NITRITE NEGATIVE 10/26/2019 1809   LEUKOCYTESUR NEGATIVE 10/26/2019 1809    Radiological Exams on Admission: US ABDOMEN LIMITED RUQ  Result Date: 06/02/2020 CLINICAL DATA:  Acute right upper quadrant abdominal pain. EXAM: ULTRASOUND ABDOMEN LIMITED RIGHT UPPER QUADRANT COMPARISON:  December 25, 2019. FINDINGS: Gallbladder: Cholelithiasis is noted without gallbladder wall thickening or pericholecystic fluid. No sonographic Murphy's sign is noted. Common bile duct: Diameter: 4 mm which is within normal limits. Liver: No focal lesion identified. Increased echogenicity of hepatic parenchyma is noted suggesting hepatic steatosis or other diffuse hepatocellular disease. Portal vein is patent on color Doppler imaging with normal direction of blood flow towards the liver. Other: None. IMPRESSION: Cholelithiasis without evidence of cholecystitis. Increased echogenicity of hepatic parenchyma is noted suggesting hepatic steatosis or other diffuse hepatocellular disease. Electronically Signed   By: Lupita Raider  M.D.   On: 06/02/2020 15:17      Darlin Priestly MD [facility]  If 7PM-7AM, please contact night-coverage 06/02/2020, 5:46 PM

## 2020-06-03 ENCOUNTER — Encounter: Payer: Self-pay | Admitting: Hospitalist

## 2020-06-03 DIAGNOSIS — E878 Other disorders of electrolyte and fluid balance, not elsewhere classified: Secondary | ICD-10-CM | POA: Diagnosis not present

## 2020-06-03 LAB — URINALYSIS, COMPLETE (UACMP) WITH MICROSCOPIC
Bacteria, UA: NONE SEEN
Bilirubin Urine: NEGATIVE
Glucose, UA: 50 mg/dL — AB
Ketones, ur: 20 mg/dL — AB
Leukocytes,Ua: NEGATIVE
Nitrite: NEGATIVE
Protein, ur: NEGATIVE mg/dL
Specific Gravity, Urine: 1.018 (ref 1.005–1.030)
pH: 6 (ref 5.0–8.0)

## 2020-06-03 LAB — CBC
HCT: 33.3 % — ABNORMAL LOW (ref 36.0–46.0)
Hemoglobin: 11.3 g/dL — ABNORMAL LOW (ref 12.0–15.0)
MCH: 29.9 pg (ref 26.0–34.0)
MCHC: 33.9 g/dL (ref 30.0–36.0)
MCV: 88.1 fL (ref 80.0–100.0)
Platelets: 201 10*3/uL (ref 150–400)
RBC: 3.78 MIL/uL — ABNORMAL LOW (ref 3.87–5.11)
RDW: 15.2 % (ref 11.5–15.5)
WBC: 5.3 10*3/uL (ref 4.0–10.5)
nRBC: 0 % (ref 0.0–0.2)

## 2020-06-03 LAB — GLUCOSE, CAPILLARY
Glucose-Capillary: 100 mg/dL — ABNORMAL HIGH (ref 70–99)
Glucose-Capillary: 105 mg/dL — ABNORMAL HIGH (ref 70–99)
Glucose-Capillary: 107 mg/dL — ABNORMAL HIGH (ref 70–99)
Glucose-Capillary: 80 mg/dL (ref 70–99)
Glucose-Capillary: 93 mg/dL (ref 70–99)
Glucose-Capillary: 99 mg/dL (ref 70–99)

## 2020-06-03 LAB — BASIC METABOLIC PANEL
Anion gap: 11 (ref 5–15)
BUN: 16 mg/dL (ref 8–23)
CO2: 38 mmol/L — ABNORMAL HIGH (ref 22–32)
Calcium: 7.7 mg/dL — ABNORMAL LOW (ref 8.9–10.3)
Chloride: 87 mmol/L — ABNORMAL LOW (ref 98–111)
Creatinine, Ser: 0.84 mg/dL (ref 0.44–1.00)
GFR calc Af Amer: >60 mL/min (ref >60)
GFR calc non Af Amer: >60 mL/min (ref >60)
Glucose, Bld: 111 mg/dL — ABNORMAL HIGH (ref 70–99)
Potassium: 3.1 mmol/L — ABNORMAL LOW (ref 3.5–5.1)
Sodium: 136 mmol/L (ref 135–145)

## 2020-06-03 LAB — PHOSPHORUS: Phosphorus: 2.1 mg/dL — ABNORMAL LOW (ref 2.5–4.6)

## 2020-06-03 LAB — MAGNESIUM: Magnesium: 2.5 mg/dL — ABNORMAL HIGH (ref 1.7–2.4)

## 2020-06-03 MED ORDER — POTASSIUM PHOSPHATES 15 MMOLE/5ML IV SOLN
20.0000 mmol | Freq: Once | INTRAVENOUS | Status: AC
  Administered 2020-06-03: 20 mmol via INTRAVENOUS
  Filled 2020-06-03 (×2): qty 6.67

## 2020-06-03 NOTE — Progress Notes (Signed)
Report called to [facility] on 1C.

## 2020-06-03 NOTE — ED Notes (Signed)
Pt asleep in bed at this time. VSS 

## 2020-06-03 NOTE — ED Notes (Signed)
Pt transferred over to hospital bed for comfort after stating her back was hurting.

## 2020-06-03 NOTE — ED Notes (Signed)
Pt asleep in bed at this time. VSS. Pt appears in no distress

## 2020-06-03 NOTE — ED Notes (Signed)
Pt spoke with family on the phone

## 2020-06-04 ENCOUNTER — Inpatient Hospital Stay: Payer: Medicare HMO

## 2020-06-04 LAB — BASIC METABOLIC PANEL
Anion gap: 12 (ref 5–15)
BUN: 12 mg/dL (ref 8–23)
CO2: 37 mmol/L — ABNORMAL HIGH (ref 22–32)
Calcium: 8.1 mg/dL — ABNORMAL LOW (ref 8.9–10.3)
Chloride: 87 mmol/L — ABNORMAL LOW (ref 98–111)
Creatinine, Ser: 0.77 mg/dL (ref 0.44–1.00)
GFR calc Af Amer: >60 mL/min (ref >60)
GFR calc non Af Amer: >60 mL/min (ref >60)
Glucose, Bld: 82 mg/dL (ref 70–99)
Potassium: 2.7 mmol/L — CL (ref 3.5–5.1)
Sodium: 136 mmol/L (ref 135–145)

## 2020-06-04 LAB — CBC
HCT: 33.9 % — ABNORMAL LOW (ref 36.0–46.0)
Hemoglobin: 11.2 g/dL — ABNORMAL LOW (ref 12.0–15.0)
MCH: 29.5 pg (ref 26.0–34.0)
MCHC: 33 g/dL (ref 30.0–36.0)
MCV: 89.2 fL (ref 80.0–100.0)
Platelets: 235 10*3/uL (ref 150–400)
RBC: 3.8 MIL/uL — ABNORMAL LOW (ref 3.87–5.11)
RDW: 15.5 % (ref 11.5–15.5)
WBC: 6.3 10*3/uL (ref 4.0–10.5)
nRBC: 0 % (ref 0.0–0.2)

## 2020-06-04 LAB — GLUCOSE, CAPILLARY
Glucose-Capillary: 78 mg/dL (ref 70–99)
Glucose-Capillary: 114 mg/dL — ABNORMAL HIGH (ref 70–99)
Glucose-Capillary: 110 mg/dL — ABNORMAL HIGH (ref 70–99)

## 2020-06-04 LAB — PHOSPHORUS: Phosphorus: 2.4 mg/dL — ABNORMAL LOW (ref 2.5–4.6)

## 2020-06-04 LAB — MAGNESIUM: Magnesium: 2.2 mg/dL (ref 1.7–2.4)

## 2020-06-04 MED ORDER — KCL IN DEXTROSE-NACL 40-5-0.45 MEQ/L-%-% IV SOLN
INTRAVENOUS | Status: DC
  Filled 2020-06-04 (×9): qty 1000

## 2020-06-04 MED ORDER — POTASSIUM CHLORIDE 2 MEQ/ML IV SOLN: INTRAVENOUS | Status: DC

## 2020-06-04 NOTE — Consult Note (Signed)
[facility] SURGICAL CONSULTATION NOTE (initial) - cpt: 01601   HISTORY OF PRESENT ILLNESS (HPI):  84 y.o. female presented to [facility] ED on 07/26 for evaluation of abdominal pain an emesis. Patient reported around a 2 week history of nausea and emesis with PO intake. She reports this happened with solid foods and liquids no matter what she tried to eat. She also endorsed associated RUQ abdominal pain with these symptoms which seemed primarily worse at night and after eating. Additionally with some diarrhea. No fever, chills, cough, congestion, SOB, CP, or urinary changes. She had a very similar presentation in February of this year and gastritis and a benign esophageal stenosis and PPI was recommended. Her gallbladder was evaluated on that admission and HIDA was preformed which was negative. Only previous surgery is tubal ligation   Work up on admission, was mostly concerning for hypokalemia to <2.0 likely secondary to GI losses.   Surgery is consulted by hospitalist physician Dr. Darlin Priestly, MD in this context for evaluation and management of symptomatic cholelithiasis.   PAST MEDICAL HISTORY (PMH):  Past Medical History:  Diagnosis Date  . DDD (degenerative disc disease), lumbar   . Fall    RISK  . GERD (gastroesophageal reflux disease)   . 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 Hypertension   . Intermittent atrial fibrillation (HCC)   . Scoliosis   . Vertigo      PAST SURGICAL HISTORY (PSH):  Past Surgical History:  Procedure Laterality Date  . CATARACT EXTRACTION W/PHACO Left 12/28/2018   Procedure: CATARACT EXTRACTION PHACO AND INTRAOCULAR LENS PLACEMENT (IOC) LEFT;  Surgeon: Elliot Cousin, MD;  Location: [facility] ORS;  Service: Ophthalmology;  Laterality: Left;  [place]   01:35 CDE 15.57 Fluid pack lot # [id number]  . ESOPHAGOGASTRODUODENOSCOPY (EGD) WITH PROPOFOL N/A 12/27/2019   Procedure: ESOPHAGOGASTRODUODENOSCOPY (EGD) WITH PROPOFOL;  Surgeon: Wyline Mood,  MD;  Location: [facility] ENDOSCOPY;  Service: Gastroenterology;  Laterality: N/A;  . TUBAL LIGATION       MEDICATIONS:  Prior to Admission medications   Medication Sig Start Date End Date Taking? Authorizing Provider  atorvastatin (LIPITOR) 40 MG tablet Take 40 mg by mouth daily.  01/19/20  Yes [provider]  cholecalciferol (VITAMIN D3) 25 MCG (1000 UNIT) tablet Take 1,000 Units by mouth daily. 12/10/19  Yes [provider]  magnesium oxide (MAG-OX) 400 (241.3 Mg) MG tablet Take 2 tablets (800 mg total) by mouth daily. 01/01/20  Yes Lynn Ito, MD  Multiple Vitamin (MULTIVITAMIN WITH MINERALS) TABS tablet Take 1 tablet by mouth daily. 01/01/20  Yes Lynn Ito, MD  pantoprazole (PROTONIX) 40 MG tablet Take 1 tablet (40 mg total) by mouth daily. 01/01/20  Yes Lynn Ito, MD  phosphorus (K PHOS NEUTRAL) 155-852-130 MG tablet Take 2 tablets (500 mg total) by mouth 2 (two) times daily. 01/01/20  Yes Lynn Ito, MD  acetaminophen (TYLENOL) 325 MG tablet Take 325-650 mg by mouth every 6 (six) hours as needed for moderate pain or headache.     [provider]  furosemide (LASIX) 20 MG tablet Take 20 mg by mouth daily as needed.  05/01/20   [provider]  meclizine (ANTIVERT) 12.5 MG tablet Take 1 tablet (12.5 mg total) by mouth 3 (three) times daily as needed for dizziness (Or vertigo). Patient not taking: Reported on 06/02/2020 10/28/19   Tresa Moore, MD     ALLERGIES:  Allergies  Allergen Reactions  . Sulfa Antibiotics Swelling    Pt was  told it caused her brain to swell - unc documented in 2014, pt is unaware of this   . Acetazolamide Nausea And Vomiting    Dizziness   . Hydrochlorothiazide      SOCIAL HISTORY:  Social History   Socioeconomic History  . Marital status: Widowed    Spouse name: Not on file  . Number of children: Not on file  . Years of education: Not on file  . Highest education level: Not on file  Occupational History  . Not  on file  Tobacco Use  . Smoking status: Former Smoker    Quit date: 11/08/2002    Years since quitting: 17.5  . Smokeless tobacco: Former Clinical biochemist  . Vaping Use: Never used  Substance and Sexual Activity  . Alcohol use: Not Currently  . Drug use: Never  . Sexual activity: Not on file  Other Topics Concern  . Not on file  Social History Narrative  . Not on file   Social Determinants of Health   Financial Resource Strain:   . Difficulty of Paying Living Expenses:   Food Insecurity:   . Worried About Programme researcher, broadcasting/film/video in the Last Year:   . Barista in the Last Year:   Transportation Needs:   . Freight forwarder (Medical):   Marland Kitchen Lack of Transportation (Non-Medical):   Physical Activity:   . Days of Exercise per Week:   . Minutes of Exercise per Session:   Stress:   . Feeling of Stress :   Social Connections:   . Frequency of Communication with Friends and Family:   . Frequency of Social Gatherings with Friends and Family:   . Attends Religious Services:   . Active Member of Clubs or Organizations:   . Attends Banker Meetings:   Marland Kitchen Marital Status:   Intimate Partner Violence:   . Fear of Current or Ex-Partner:   . Emotionally Abused:   Marland Kitchen Physically Abused:   . Sexually Abused:      FAMILY HISTORY:  Family History  Problem Relation Age of Onset  . Heart attack Father   . Diabetes Sister   . Diabetes Brother       REVIEW OF SYSTEMS:  Review of Systems  Constitutional: Negative for chills and fever.  HENT: Negative for congestion and sore throat.   Respiratory: Negative for cough and shortness of breath.   Cardiovascular: Negative for chest pain and palpitations.  Gastrointestinal: Positive for abdominal pain, nausea and vomiting.  Genitourinary: Negative for dysuria and urgency.  All other systems reviewed and are negative.   VITAL SIGNS:  Temp:  [97.6 F (36.4 C)-98 F (36.7 C)] 97.8 F (36.6 C) (07/28 0754) Pulse Rate:   [66-96] 72 (07/28 0754) Resp:  [9-16] 16 (07/28 0754) BP: (100-136)/(54-87) 135/58 (07/28 0754) SpO2:  [94 %-100 %] 100 % (07/28 0754) Weight:  [54.7 kg] 54.7 kg (07/27 2040)     Height: 5\' 3"  (160 cm) Weight: 54.7 kg BMI (Calculated): 21.35   INTAKE/OUTPUT:  07/27 0701 - 07/28 0700 In: 1000 [I.V.:1000] Out: 150 [Urine:150]  PHYSICAL EXAM:  Physical Exam Vitals and nursing note reviewed. Exam conducted with a chaperone present.  Constitutional:      General: She is not in acute distress.    Appearance: She is well-developed and normal weight. She is not ill-appearing.  HENT:     Head: Normocephalic and atraumatic.  Eyes:     General: No scleral icterus.  Extraocular Movements: Extraocular movements intact.  Cardiovascular:     Rate and Rhythm: Normal rate and regular rhythm.     Heart sounds: Normal heart sounds. No murmur heard.   Pulmonary:     Effort: Pulmonary effort is normal. No respiratory distress.  Abdominal:     Tenderness: There is abdominal tenderness (Worse in RUQ) in the right upper quadrant, epigastric area and periumbilical area. There is no guarding or rebound.  Genitourinary:    Comments: Deferred Skin:    General: Skin is warm and dry.     Coloration: Skin is not jaundiced or pale.  Neurological:     General: No focal deficit present.     Mental Status: She is alert and oriented to person, place, and time.  Psychiatric:        Mood and Affect: Mood normal.        Behavior: Behavior normal.      Labs:  CBC Latest Ref Rng & Units 06/04/2020 06/03/2020 06/02/2020  WBC 4.0 - 10.5 K/uL 6.3 5.3 9.6  Hemoglobin 12.0 - 15.0 g/dL 11.2(L) 11.3(L) 13.3  Hematocrit 36 - 46 % 33.9(L) 33.3(L) 39.3  Platelets 150 - 400 K/uL 235 201 271   CMP Latest Ref Rng & Units 06/04/2020 06/03/2020 06/02/2020  Glucose 70 - 99 mg/dL 82 500(X) 38(H)  BUN 8 - 23 mg/dL 12 16 20   Creatinine 0.44 - 1.00 mg/dL 8.29 9.37  Sodium 135 - 145 mmol/L 136 136 136  Potassium 3.5 -  5.1 mmol/L 2.7(LL) 3.1(L) <2.0(LL)  Chloride 98 - 111 mmol/L 87(L) 87(L) 81(L)  CO2 22 - 32 mmol/L 37(H) 38(H) 34(H)  Calcium 8.9 - 10.3 mg/dL 8.1(L) 7.7(L) 7.9(L)  Total Protein 6.5 - 8.1 g/dL - - 6.9  Total Bilirubin 0.3 - 1.2 mg/dL - - 2.6(H)  Alkaline Phos 38 - 126 U/L - - 68  AST 15 - 41 U/L - - 43(H)  ALT 0 - 44 U/L - - 17     Imaging studies:   Barium Swallow attempted but aborted secondary to aspiration  RUQ 1.69 (06/02/2020) personally reviewed showing cholelithiasis, and radiologist report reviewed:  IMPRESSION: Cholelithiasis without evidence of cholecystitis. Increased echogenicity of hepatic parenchyma is noted suggesting hepatic steatosis or other diffuse hepatocellular disease.   Assessment/Plan: (ICD-10's: K80.20) 84 y.o. female with abdominal pain, nausea, and emesis of unclear etiology found to have cholelithiasis.   - Agree with proceeding with GI evaluation and work up given known esophageal stenosis and inability to complete barium swallow study. This is likely the etiology of her symptoms however her gallbladder is a possible culprit. If this work up is negative and other etiologies are ruled out then we can consider cholecystectomy.    All of the above findings and recommendations were discussed with the patient, and all of patient's questions were answered to her expressed satisfaction.  Thank you for the opportunity to participate in this patient's care.   -- 94, PA-C [facility] 06/04/2020, 11:12 AM [phone] M-F: 7am - 4pm

## 2020-06-04 NOTE — Progress Notes (Signed)
SLP Cancellation Note  Patient Details Name: Kristen Richard MRN: [id number] DOB: 08/03/1936   Cancelled treatment:       Reason Eval/Treat Not Completed: Patient at procedure or test/unavailable (chart reviewed; consulted NSG/MD re: pt). Pt is currently NPO and scheduled for a DG Esophagus to assess for Esophageal dysmotility -- pt admitted w/ reports of vomiting and regurgitation issues. Pt had a recent EGD in 12/2019 revealing "Diffuse moderate inflammation characterized by congestion (edema), erythema - Gastrisis; w/ benign Esophageal stenosis". Consulted MD to discuss f/u w/ a DG Esophagus (Barium) study which is now ordered. Current assessment of breath sounds revealed normal respiratory effort w/out wheezes and rales per MD notes.  ST services can f/u post DG Esophagus for any needs re: general aspiration precautions; assessment as needed. MD agreed.      Jerilynn Som, MS, CCC-SLP [facility] 06/04/2020, 10:21 AM

## 2020-06-04 NOTE — Progress Notes (Signed)
PROGRESS NOTE    Kristen Richard  YBO:[id number] DOB: Oct 26, 1936 DOA: 06/02/2020 PCP: [facility]    Assessment & Plan:   Active Problems:   Disorder of electrolytes    Kristen Richard is a 84 y.o. female with medical history significant of afib not on anticoagulation, gastritis who presented with N/V/D and abdominal pain.   # N/V diarrhea and abdominal pain # Cholelithiasis # Hx of benign esophageal stenosis --Pt did have a hospitalization for N/V in Feb 2021, however, gallbladder was normal at that time.  Current symptoms maybe due to cholelithiasis.  Not septic, no signs of cholecystitis.   --Modified barium study showed no overt aspiration PLAN: cholecystectomy since symptoms are severe --GI consult today, may perform EGD after dehydration and electrolytes corrected --GenSurg consulted today, may consider cholecystectomy if GI workup neg. --continue MIVF hydration  # Hypokalemia and Hypomag --from vomiting and diarrhea and inadequate PO intake.   PLAN: --IV mag PRN --IV potassium PRN (due to inability to keep pills down)  # Hypoglycemia --from poor PO intake and prolonged lack of nutrition --s/p D5 1/2 NS with K 80 mEq@100  ml/hr for 10 hours PLAN: --continue D5 1/2 NS with K 40 mEq@100  ml/hr  --BG q6h and hypoglycemic protocol.  # Mild trop elevation --likely due to demand ischemia  --trend to peak  # Persistent Afib not on anticoagulation --Last seen by cardiology on 05/26/20.   --CHA2DS2-VASc score of 4 (age, htn, gender).Previously on beta-blocker/Lopressor which caused sinus pauses hence beta-blocker was stopped. Due to frequent falls, not on anticoagulation.  # History of hyperlipidemia --continue statin.  # History of hypertension --blood pressure controlled off blood pressure meds.   # Lateral lower extremity edema --continue home Lasix as needed.   DVT prophylaxis: Lovenox SQ Code Status: Full code  Family Communication:  granddaughter updated at bedside today Status is: inpatient Dispo:   The patient is from: home Anticipated d/c is to: home vs SNF rehab Anticipated d/c date is: unclear Patient currently is not medically stable to d/c due to: not able to take orals, need GI workup, symptomatic cholelithiasis that may require surgery.   Subjective and Interval History:  Esophageal barium study wasn't completed due to pt aspirating contrast.  Later, Modified barium study showed no overt aspiration.  Pt unable to provide consistent hx.  Reported having a headache.   Objective: Vitals:   06/03/20 2040 06/04/20 0015 06/04/20 0431 06/04/20 0754  BP: (!) 136/66 126/73 (!) 132/87 (!) 135/58  Pulse: 84 96 86 72  Resp:    16  Temp: 97.6 F (36.4 C) 98 F (36.7 C) 97.7 F (36.5 C) 97.8 F (36.6 C)  TempSrc: Oral Oral Oral Oral  SpO2: 100% 94% 100% 100%  Weight: 54.7 kg     Height:        Intake/Output Summary (Last 24 hours) at 06/04/2020 1941 Last data filed at 06/04/2020 1835 Gross per 24 hour  Intake 0 ml  Output 150 ml  Net -150 ml   Filed Weights   06/02/20 1326 06/03/20 2040  Weight: 52.6 kg 54.7 kg    Examination:   Constitutional: NAD, alert, oriented, though somewhat confused with faulty memory HEENT: conjunctivae and lids normal, EOMI CV: RRR no M,R,G. Distal pulses +2.  No cyanosis.   RESP: CTA B/L, normal respiratory effort  GI: +BS, ND, NT, soft Extremities: No effusions, edema, or tenderness in BLE SKIN: warm, dry and intact Neuro: II - XII grossly intact.  Sensation intact  Data Reviewed: I have personally reviewed following labs and imaging studies  CBC: Recent Labs  Lab 06/02/20 1330 06/03/20 0458 06/04/20 0505  WBC 9.6 5.3 6.3  HGB 13.3 11.3* 11.2*  HCT 39.3 33.3* 33.9*  MCV 87.5 88.1 89.2  PLT 271 201 235   Basic Metabolic Panel: Recent Labs  Lab 06/02/20 1330 06/02/20 1700 06/03/20 0458 06/04/20 0505  NA 136  --  136 136  K <2.0*  --  3.1* 2.7*  CL  81*  --  87* 87*  CO2 34*  --  38* 37*  GLUCOSE 66*  --  111* 82  BUN 20  --  16 12  CREATININE 1.00  --  0.84 0.77  CALCIUM 7.9*  --  7.7* 8.1*  MG 1.3*  --  2.5* 2.2  PHOS  --  2.1*  --  2.4*   GFR: Estimated Creatinine Clearance: 45.6 mL/min (by C-G formula based on SCr of 0.77 mg/dL). Liver Function Tests: Recent Labs  Lab 06/02/20 1330  AST 43*  ALT 17  ALKPHOS 68  BILITOT 2.6*  PROT 6.9  ALBUMIN 3.1*   Recent Labs  Lab 06/02/20 1330  LIPASE 26   No results for input(s): AMMONIA in the last 168 hours. Coagulation Profile: No results for input(s): INR, PROTIME in the last 168 hours. Cardiac Enzymes: No results for input(s): CKTOTAL, CKMB, CKMBINDEX, TROPONINI in the last 168 hours. BNP (last 3 results) No results for input(s): PROBNP in the last 8760 hours. HbA1C: No results for input(s): HGBA1C in the last 72 hours. CBG: Recent Labs  Lab 06/03/20 1217 06/03/20 1725 06/03/20 2354 06/04/20 0617 06/04/20 1719  GLUCAP 80 100* 93 78 114*   Lipid Profile: No results for input(s): CHOL, HDL, LDLCALC, TRIG, CHOLHDL, LDLDIRECT in the last 72 hours. Thyroid Function Tests: No results for input(s): TSH, T4TOTAL, FREET4, T3FREE, THYROIDAB in the last 72 hours. Anemia Panel: No results for input(s): VITAMINB12, FOLATE, FERRITIN, TIBC, IRON, RETICCTPCT in the last 72 hours. Sepsis Labs: No results for input(s): PROCALCITON, LATICACIDVEN in the last 168 hours.  Recent Results (from the past 240 hour(s))  SARS Coronavirus 2 by RT PCR (hospital order, performed in [facility] hospital lab) Nasopharyngeal Nasopharyngeal Swab     Status: None   Collection Time: 06/02/20  2:34 PM   Specimen: Nasopharyngeal Swab  Result Value Ref Range Status   SARS Coronavirus 2 NEGATIVE NEGATIVE Final    Comment: (NOTE) SARS-CoV-2 target nucleic acids are NOT DETECTED.  The SARS-CoV-2 RNA is generally detectable in upper and lower respiratory specimens during the acute phase of  infection. The lowest concentration of SARS-CoV-2 viral copies this assay can detect is 250 copies / mL. A negative result does not preclude SARS-CoV-2 infection and should not be used as the sole basis for treatment or other patient management decisions.  A negative result may occur with improper specimen collection / handling, submission of specimen other than nasopharyngeal swab, presence of viral mutation(s) within the areas targeted by this assay, and inadequate number of viral copies (<250 copies / mL). A negative result must be combined with clinical observations, patient history, and epidemiological information.  Fact Sheet for Patients:   BoilerBrush.com.cy  Fact Sheet for Healthcare Providers: https://pope.com/  This test is not yet approved or  cleared by the [place] FDA and has been authorized for detection and/or diagnosis of SARS-CoV-2 by FDA under an Emergency Use Authorization (EUA).  This EUA will remain in effect (meaning this test can  be used) for the duration of the COVID-19 declaration under Section 564(b)(1) of the Act, 21 U.S.C. section 360bbb-3(b)(1), unless the authorization is terminated or revoked sooner.  Performed at [facility], [address] [place], [place] [address]       Radiology Studies: DG ESOPHAGUS W SINGLE CM (SOL OR THIN BA)  Result Date: 06/04/2020 CLINICAL DATA:  Regurgitation of food. EXAM: ESOPHOGRAM/BARIUM SWALLOW TECHNIQUE: Single contrast examination was performed using thin barium or water soluble. FLUOROSCOPY TIME:  Fluoroscopy Time:  54 seconds Number of Acquired Spot Images: 3 COMPARISON:  12/12/2010 chest radiograph. FINDINGS: Examination was limited secondary to limited patient mobility. Initial swallows demonstrated symptomatic aspiration. The study was subsequently aborted. IMPRESSION: Aborted barium swallow secondary to aspiration. Consider modified barium  swallow to further assess aspiration risk. These results were called by telephone at the time of interpretation on 06/04/2020 at 12:46 pm to provider Khloey Chern , who verbally acknowledged these results. Electronically Signed   By: Stana Bunting M.D.   On: 06/04/2020 12:55     Scheduled Meds: . atorvastatin  40 mg Oral Daily  . enoxaparin (LOVENOX) injection  40 mg Subcutaneous Q24H  . pantoprazole  40 mg Oral Daily   Continuous Infusions: . dextrose 5 % and 0.45 % NaCl with KCl 40 mEq/L 100 mL/hr at 06/04/20 3557     LOS: 2 days     Darlin Priestly, MD [facility] If 7PM-7AM, please contact night-coverage 06/04/2020, 7:41 PM

## 2020-06-04 NOTE — Progress Notes (Signed)
PROGRESS NOTE    Makenzy Krist  INO:[id number] DOB: [birth year] DOA: 06/02/2020 PCP: [facility]    Assessment & Plan:   Active Problems:   Disorder of electrolytes    Kristen Richard is a 84 y.o. female with medical history significant of afib not on anticoagulation, gastritis who presented with N/V/D and abdominal pain.   # N/V diarrhea and abdominal pain # Cholelithiasis # Hx of benign esophageal stenosis --Pt did have a hospitalization for N/V in Feb 2021, however, gallbladder was normal at that time.  Current symptoms maybe due to cholelithiasis.  Not septic, no signs of cholecystitis.   PLAN: --GenSurg consult for possible cholecystectomy since symptoms are severe --GI consult for possible repeat EGD --SLP consult, who rec DG esophagus barium study  # Hypokalemia and Hypomag --from vomiting and diarrhea and inadequate PO intake.   PLAN: --IV mag PRN --IV potassium PRN (due to inability to keep pills down)  # Hypoglycemia --from poor PO intake and prolonged lack of nutrition --s/p D5 1/2 NS with K 80 mEq@100  ml/hr for 10 hours PLAN: --repeat D5 1/2 NS with K 40 mEq@100  ml/hr for 10 hours --BG q6h and hypoglycemic protocol.  # Mild trop elevation --likely due to demand ischemia  --trend to peak  # Persistent Afib not on anticoagulation --Last seen by cardiology on 05/26/20.   --CHA2DS2-VASc score of 4 (age, htn, gender).Previously on beta-blocker/Lopressor which caused sinus pauses hence beta-blocker was stopped. Due to frequent falls, not on anticoagulation.  # History of hyperlipidemia --continue statin.  # History of hypertension --blood pressure controlled off blood pressure meds.   # Lateral lower extremity edema --continue home Lasix as needed.   DVT prophylaxis: Lovenox SQ Code Status: Full code  Family Communication:  Status is: inpatient Dispo:   The patient is from: home Anticipated d/c is to: home vs SNF  rehab Anticipated d/c date is: unclear Patient currently is not medically stable to d/c due to: not able to take orals, symptomatic cholelithiasis that may require surgery.   Subjective and Interval History:  No more diarrhea.  Pt had some water and didn't vomit it back up. Continued to have intermittent RUQ abdominal pain.    SLP rec DG esophagus barium study.   Objective: Vitals:   06/03/20 1830 06/03/20 1949 06/03/20 2040 06/04/20 0015  BP: 114/73 125/67 (!) 136/66 126/73  Pulse: 87 94 84 96  Resp: 15 15    Temp:  98 F (36.7 C) 97.6 F (36.4 C) 98 F (36.7 C)  TempSrc:  Oral Oral Oral  SpO2: 99% 98% 100% 94%  Weight:   54.7 kg   Height:        Intake/Output Summary (Last 24 hours) at 06/04/2020 0047 Last data filed at 06/03/2020 0962 Gross per 24 hour  Intake 1000 ml  Output --  Net 1000 ml   Filed Weights   06/02/20 1326 06/03/20 2040  Weight: 52.6 kg 54.7 kg    Examination:   Constitutional: NAD, AAOx3 HEENT: conjunctivae and lids normal, EOMI CV: RRR no M,R,G. Distal pulses +2.  No cyanosis.   RESP: CTA B/L, normal respiratory effort  GI: +BS, ND, tenderness in RUQ Extremities: No effusions, edema, or tenderness in BLE SKIN: warm, dry and intact Neuro: II - XII grossly intact.  Sensation intact   Data Reviewed: I have personally reviewed following labs and imaging studies  CBC: Recent Labs  Lab 06/02/20 1330 06/03/20 0458  WBC 9.6 5.3  HGB 13.3 11.3*  HCT 39.3 33.3*  MCV 87.5 88.1  PLT 271 201   Basic Metabolic Panel: Recent Labs  Lab 06/02/20 1330 06/02/20 1700 06/03/20 0458  NA 136  --  136  K <2.0*  --  3.1*  CL 81*  --  87*  CO2 34*  --  38*  GLUCOSE 66*  --  111*  BUN 20  --  16  CREATININE 1.00  --  0.84  CALCIUM 7.9*  --  7.7*  MG 1.3*  --  2.5*  PHOS  --  2.1*  --    GFR: Estimated Creatinine Clearance: 43.5 mL/min (by C-G formula based on SCr of 0.84 mg/dL). Liver Function Tests: Recent Labs  Lab 06/02/20 1330  AST  43*  ALT 17  ALKPHOS 68  BILITOT 2.6*  PROT 6.9  ALBUMIN 3.1*   Recent Labs  Lab 06/02/20 1330  LIPASE 26   No results for input(s): AMMONIA in the last 168 hours. Coagulation Profile: No results for input(s): INR, PROTIME in the last 168 hours. Cardiac Enzymes: No results for input(s): CKTOTAL, CKMB, CKMBINDEX, TROPONINI in the last 168 hours. BNP (last 3 results) No results for input(s): PROBNP in the last 8760 hours. HbA1C: No results for input(s): HGBA1C in the last 72 hours. CBG: Recent Labs  Lab 06/03/20 0519 06/03/20 0713 06/03/20 1217 06/03/20 1725 06/03/20 2354  GLUCAP 105* 99 80 100* 93   Lipid Profile: No results for input(s): CHOL, HDL, LDLCALC, TRIG, CHOLHDL, LDLDIRECT in the last 72 hours. Thyroid Function Tests: No results for input(s): TSH, T4TOTAL, FREET4, T3FREE, THYROIDAB in the last 72 hours. Anemia Panel: No results for input(s): VITAMINB12, FOLATE, FERRITIN, TIBC, IRON, RETICCTPCT in the last 72 hours. Sepsis Labs: No results for input(s): PROCALCITON, LATICACIDVEN in the last 168 hours.  Recent Results (from the past 240 hour(s))  SARS Coronavirus 2 by RT PCR (hospital order, performed in [facility] hospital lab) Nasopharyngeal Nasopharyngeal Swab     Status: None   Collection Time: 06/02/20  2:34 PM   Specimen: Nasopharyngeal Swab  Result Value Ref Range Status   SARS Coronavirus 2 NEGATIVE NEGATIVE Final    Comment: (NOTE) SARS-CoV-2 target nucleic acids are NOT DETECTED.  The SARS-CoV-2 RNA is generally detectable in upper and lower respiratory specimens during the acute phase of infection. The lowest concentration of SARS-CoV-2 viral copies this assay can detect is 250 copies / mL. A negative result does not preclude SARS-CoV-2 infection and should not be used as the sole basis for treatment or other patient management decisions.  A negative result may occur with improper specimen collection / handling, submission of specimen  other than nasopharyngeal swab, presence of viral mutation(s) within the areas targeted by this assay, and inadequate number of viral copies (<250 copies / mL). A negative result must be combined with clinical observations, patient history, and epidemiological information.  Fact Sheet for Patients:   BoilerBrush.com.cy  Fact Sheet for Healthcare Providers: https://pope.com/  This test is not yet approved or  cleared by the [place] FDA and has been authorized for detection and/or diagnosis of SARS-CoV-2 by FDA under an Emergency Use Authorization (EUA).  This EUA will remain in effect (meaning this test can be used) for the duration of the COVID-19 declaration under Section 564(b)(1) of the Act, 21 U.S.C. section 360bbb-3(b)(1), unless the authorization is terminated or revoked sooner.  Performed at [facility], [address] [place], [place] [address]       Radiology Studies: US  ABDOMEN LIMITED RUQ  Result Date: 06/02/2020 CLINICAL DATA:  Acute right upper quadrant abdominal pain. EXAM: ULTRASOUND ABDOMEN LIMITED RIGHT UPPER QUADRANT COMPARISON:  December 25, 2019. FINDINGS: Gallbladder: Cholelithiasis is noted without gallbladder wall thickening or pericholecystic fluid. No sonographic Murphy's sign is noted. Common bile duct: Diameter: 4 mm which is within normal limits. Liver: No focal lesion identified. Increased echogenicity of hepatic parenchyma is noted suggesting hepatic steatosis or other diffuse hepatocellular disease. Portal vein is patent on color Doppler imaging with normal direction of blood flow towards the liver. Other: None. IMPRESSION: Cholelithiasis without evidence of cholecystitis. Increased echogenicity of hepatic parenchyma is noted suggesting hepatic steatosis or other diffuse hepatocellular disease. Electronically Signed   By: Lupita Raider M.D.   On: 06/02/2020 15:17     Scheduled Meds: .  atorvastatin  40 mg Oral Daily  . enoxaparin (LOVENOX) injection  40 mg Subcutaneous Q24H  . pantoprazole  40 mg Oral Daily   Continuous Infusions:   LOS: 1 day     Darlin Priestly, MD [facility] If 7PM-7AM, please contact night-coverage 06/04/2020, 12:47 AM

## 2020-06-04 NOTE — TOC Initial Note (Signed)
Transition of Care [facility]) - Initial/Assessment Note    Patient Details  Name: Kristen Richard MRN: [id number] Date of Birth: 1936/01/20  Transition of Care [facility]) CM/SW Contact:    Allayne Butcher, RN Phone Number: 06/04/2020, 1:27 PM  Clinical Narrative:                 Patient admitted to the hospital with dehydration and electrolyte imbalances.  Patient is from home where she lives with her daughter's niece, Shanda Bumps.  Patient's address is [address] [place].   Patient's daughter [name] reports that patient is mostly independent at home, able to go to the bathroom by herself, fix her meals and even walk around in the yard with her cane, but she has been weaker over the past 2 weeks.   Patient has all needed equipment at home.  Daughter would be okay with SNF if recommended but she wants to speak with the patient about it first.   Patient has been adamant about not going to SNF in the past.   If patient goes home home health can be arranged, patient has used [facility] in the past and daughter is good with using them again.   RNCM will cont to follow and assist with discharge needs.   Expected Discharge Plan: Home w Home Health Services Barriers to Discharge: Continued Medical Work up   Patient Goals and CMS Choice Patient states their goals for this hospitalization and ongoing recovery are:: Patient's daughter would be okay with patient going to SNF or home with home health CMS Medicare.gov Compare Post Acute Care list provided to:: Patient Represenative (must comment) Choice offered to / list presented to : Adult Children  Expected Discharge Plan and Services Expected Discharge Plan: Home w Home Health Services   Discharge Planning Services: CM Consult Post Acute Care Choice: Home Health Living arrangements for the past 2 months: Single Family Home                             HH Agency: [facility]        Prior Living Arrangements/Services Living  arrangements for the past 2 months: Single Family Home Lives with:: Relatives (niece Shanda Bumps) Patient language and need for interpreter reviewed:: Yes Do you feel safe going back to the place where you live?: Yes      Need for Family Participation in Patient Care: Yes (Comment) (weakness, electrolyte imbalance) Care giver support system in place?: Yes (comment) Current home services: DME (hospital bed, wheelchair, walker, cane, bedside commode) Criminal Activity/Legal Involvement Pertinent to Current Situation/Hospitalization: No - Comment as needed  Activities of Daily Living Home Assistive Devices/Equipment: Walker (specify type), Cane (specify quad or straight), Eyeglasses, Dentures (specify type) ADL Screening (condition at time of admission) Patient's cognitive ability adequate to safely complete daily activities?: No Is the patient deaf or have difficulty hearing?: No Does the patient have difficulty seeing, even when wearing glasses/contacts?: No Does the patient have difficulty concentrating, remembering, or making decisions?: Yes Patient able to express need for assistance with ADLs?: Yes Does the patient have difficulty dressing or bathing?: No Independently performs ADLs?: No Does the patient have difficulty walking or climbing stairs?: Yes Weakness of Legs: Both Weakness of Arms/Hands: None  Permission Sought/Granted Permission sought to share information with : Case Manager, Family Supports, Other (comment) Permission granted to share information with : Yes, Verbal Permission Granted  Share Information with NAME: [name] and Shanda Bumps  Permission granted to share info w AGENCY: [facility]  Permission granted to share info w Relationship: daughter and niece     Emotional Assessment Appearance:: Appears stated age Attitude/Demeanor/Rapport: Unable to Assess Affect (typically observed): Calm Orientation: : Oriented to Self Alcohol / Substance Use: Not Applicable Psych  Involvement: No (comment)  Admission diagnosis:  Dehydration [E86.0] Hypokalemia [E87.6] Hypochloremia [E87.8] Hypomagnesemia [E83.42] Disorder of electrolytes [E87.8] Symptomatic cholelithiasis [K80.20] Metabolic acidosis, increased anion gap [E87.2] Regurgitation of food [R11.10] Patient Active Problem List   Diagnosis Date Noted  . Disorder of electrolytes 06/02/2020  . Intractable vomiting   . AKI (acute kidney injury) (HCC) 12/25/2019  . Abdominal pain 12/25/2019  . Hypertension   . Hypokalemia   . GERD (gastroesophageal reflux disease)   . Fall   . Dehydration   . Pressure injury of skin 10/27/2019  . Rhabdomyolysis 10/26/2019  . Chronic kidney disease, stage II (mild) 10/29/2013  . Hematuria 09/24/2013   PCP:  [facility] Pharmacy:   [facility] - [place], [place] - [address] [place] [place] [address] Phone: [phone] Fax: [phone]  [facility] [address], [place] - [address] [place] [address] Phone: [phone] Fax: [phone]     Social Determinants of Health (SDOH) Interventions    Readmission Risk Interventions No flowsheet data found.

## 2020-06-04 NOTE — Evaluation (Addendum)
Objective Swallowing Evaluation: Type of Study: MBS-Modified Barium Swallow Study   Patient Details  Name: Kristen Richard MRN: [id number] Date of Birth: [birth year]  Today's Date: 06/04/2020 Time: SLP Start Time (ACUTE ONLY): 1510 -SLP Stop Time (ACUTE ONLY): 1610  SLP Time Calculation (min) (ACUTE ONLY): 60 min   Past Medical History:  Past Medical History:  Diagnosis Date  . DDD (degenerative disc disease), lumbar   . Fall    RISK  . GERD (gastroesophageal reflux disease)   . 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 Hypertension   . Intermittent atrial fibrillation (HCC)   . Scoliosis   . Vertigo    Past Surgical History:  Past Surgical History:  Procedure Laterality Date  . CATARACT EXTRACTION W/PHACO Left 12/28/2018   Procedure: CATARACT EXTRACTION PHACO AND INTRAOCULAR LENS PLACEMENT (IOC) LEFT;  Surgeon: Elliot Cousin, MD;  Location: [facility] ORS;  Service: Ophthalmology;  Laterality: Left;  [place]   01:35 CDE 15.57 Fluid pack lot # [id number]  . ESOPHAGOGASTRODUODENOSCOPY (EGD) WITH PROPOFOL N/A 12/27/2019   Procedure: ESOPHAGOGASTRODUODENOSCOPY (EGD) WITH PROPOFOL;  Surgeon: Wyline Mood, MD;  Location: [facility] ENDOSCOPY;  Service: Gastroenterology;  Laterality: N/A;  . TUBAL LIGATION     HPI: Pt is a 84 y.o. female with a history of GERD, hypertension, CKD, vertigo, scoliosis, dehydration, fall, afib who comes ED complaining of nausea vomiting and diarrhea for the past 2 weeks.  Symptoms are waxing and waning, worse with trying to eat.  No alleviating factors.  Associated with right upper quadrant pain is nonradiating.  Pt reported having N/V for the past 3-4 weeks.  Just having food in her mouth and the smell of food would get her dry heaving, and after she swallows, food and drink would get vomited back up.  No blood in her vomitus.  Pt also can't take her pills anymore because they also come back up.  Pt said she does not feel hungry.  Pt reported large amount of weight loss  but was not certain how much she used to weigh.  OF NOTE, pt recently had an EGD in 12/2019 revealing "Diffuse moderate inflammation characterized by congestion (edema), erythema - Gastrisis; w/ benign Esophageal stenosis".  During this admit, ED assessment indicated "normal respiratory effort without tachypnea/retractions. Breath sounds are clear and equal bilaterally. No wheezes/rales/rhonchi.".    Subjective: pt awake, verbally engaged w/ SLP and radiology staff    Assessment / Plan / Recommendation  CHL IP CLINICAL IMPRESSIONS 06/04/2020  Clinical Impression Pt appears to present w/ adequate Pharyngeal phase of swallowing w/ Timely pharyngeal swallow initiation w/ all trial consistencies and no gross bolus material residue remaining in the pharynx post swallow. This indicates adequate laryngeal excursion and pharyngeal pressure during the swallowing. NO aspiration or laryngeal penetration was noted to occur during bolus trials of thin liquids, purees, and softened solids as pt sat Upright in the MBSS feeding self trials. Pt was encouraged to take her time and to eat/drink using Small bites/sips. However, during the oral phase, pt exhibited increased oral phase time w/ inhibited A-P transfer of all bolus consistencies -- this can be seen in Cognitive decline as well as apprehension of swallowing. Pt readily reports concern of REGURGITATION w/ "everything I eat" (apprehension understood). Unsure of pt's Baseline Cognitive status, but she did exhibit distraction and needed verbal cues to redirect attention to tasks(also noted some confused behavior in her room earlier this morning w/ NSG). During the oral phase, bolus control is adequate w/ no premature  spillage noted, and once A-P transfer and swallow occurred, oral cavity cleared w/ no remaining bolus residue. Pt wears U/L dentures; secure fit. During the Esophageal phase, there was the appearance of Min slower bolus clearing through cervical-mid  Esophagus, but given Time, bolus material appeared to clear this viewable area. OM Exam was [facility] for lingual/labial movements.  Pt appears at reduced risk for aspiration when following general aspiration precautions.  Recommend a more Minced, moistened foods diet consistency for ease of Esophageal motility. Pt can certainly add into her diet those foods and consistencies she feels she can tolerate. Recommend continued REFLUX precautions d/t Baseline GERD. Recommend f/u w/ GI for formal assessment of Esophageal motility d/t pt's c/o REgurgitation episodes; potential direct viewing of the Esophagus d/t recent viewing of "benign stenosis" noted in 12/2019 -- this could have increased/worsened in presentation since then.  Recommend Pills CRUSHED in PUREE or in liquid, chewable forms for ease of swallowing/clearing. Recommend Dietician f/u for support - possible use of a drink form of supplement.  MD was updated w/ results of this study and agreed w/ diet recommendations. NSG updated.   SLP Visit Diagnosis Dysphagia, oral phase (R13.11);Dysphagia, unspecified (R13.10)  Attention and concentration deficit following --  Frontal lobe and executive function deficit following --  Impact on safety and function Mild aspiration risk;Risk for inadequate nutrition/hydration      CHL IP TREATMENT RECOMMENDATION 06/04/2020  Treatment Recommendations No treatment recommended at this time     Prognosis 06/04/2020  Prognosis for Safe Diet Advancement Fair  Barriers to Reach Goals Cognitive deficits;Time post onset;Severity of deficits;Behavior  Barriers/Prognosis Comment unsure of baseline Cognitive status/impact (?)    CHL IP DIET RECOMMENDATION 06/04/2020  SLP Diet Recommendations Dysphagia 2 (Fine chop) solids;Thin liquid  Liquid Administration via Cup;No straw  Medication Administration Crushed with puree  Compensations Minimize environmental distractions;Slow rate;Small sips/bites;Follow solids with liquid   Postural Changes Remain semi-upright after after feeds/meals (Comment);Seated upright at 90 degrees      CHL IP OTHER RECOMMENDATIONS 06/04/2020  Recommended Consults Consider GI evaluation;Consider esophageal assessment  Oral Care Recommendations Oral care BID;Oral care before and after PO;Staff/trained caregiver to provide oral care  Other Recommendations (No Data)      CHL IP FOLLOW UP RECOMMENDATIONS 06/04/2020  Follow up Recommendations None      CHL IP FREQUENCY AND DURATION 06/04/2020  Speech Therapy Frequency (ACUTE ONLY) (No Data)  Treatment Duration (No Data)           CHL IP ORAL PHASE 06/04/2020  Oral Phase Impaired  Oral - Pudding Teaspoon --  Oral - Pudding Cup --  Oral - Honey Teaspoon --  Oral - Honey Cup --  Oral - Nectar Teaspoon --  Oral - Nectar Cup 0  Oral - Nectar Straw --  Oral - Thin Teaspoon --  Oral - Thin Cup 7  Oral - Thin Straw --  Oral - Puree 3  Oral - Mech Soft 2  Oral - Regular --  Oral - Multi-Consistency --  Oral - Pill --  Oral Phase - Comment pt exhibited increased oral phase w/ inhibited A-P transfer of all boluses -- this can be seen in Cognitive decline as well as apprehension of swallowing. Pt readily reports concern of REGURGITATION w/ "everything I eat". Bolus control is adequate and once A-P transfer occurs, oral cavity is clear w/ no remaining bolus residue.     CHL IP PHARYNGEAL PHASE 06/04/2020  Pharyngeal Phase WFL  Pharyngeal- Pudding Teaspoon --  Pharyngeal --  Pharyngeal- Pudding Cup --  Pharyngeal --  Pharyngeal- Honey Teaspoon --  Pharyngeal --  Pharyngeal- Honey Cup --  Pharyngeal --  Pharyngeal- Nectar Teaspoon --  Pharyngeal --  Pharyngeal- Nectar Cup 0  Pharyngeal --  Pharyngeal- Nectar Straw --  Pharyngeal --  Pharyngeal- Thin Teaspoon --  Pharyngeal --  Pharyngeal- Thin Cup 7  Pharyngeal --  Pharyngeal- Thin Straw --  Pharyngeal --  Pharyngeal- Puree 3  Pharyngeal --  Pharyngeal- Mechanical Soft 2   Pharyngeal --  Pharyngeal- Regular --  Pharyngeal --  Pharyngeal- Multi-consistency --  Pharyngeal --  Pharyngeal- Pill --  Pharyngeal --  Pharyngeal Comment --     CHL IP CERVICAL ESOPHAGEAL PHASE 06/04/2020  Cervical Esophageal Phase Impaired  Pudding Teaspoon --  Pudding Cup --  Honey Teaspoon --  Honey Cup --  Nectar Teaspoon --  Nectar Cup 0  Nectar Straw --  Thin Teaspoon --  Thin Cup 7  Thin Straw --  Puree 3  Mechanical Soft 2  Regular --  Multi-consistency --  Pill --  Cervical Esophageal Comment appearance of Min slower bolus clearing through cervical-mid Esophagus, but given Time, bolus material appeared to clear this viewable area.           Jerilynn Som, MS, CCC-SLP Kaitrin Seybold 06/04/2020, 4:20 PM

## 2020-06-04 NOTE — Consult Note (Signed)
[facility] GI Inpatient Consult Note   [name], M.D.  Reason for Consult: Nausea, vomiting, abdominal pain, diarrhea   Attending Requesting Consult: Darlin Priestly, M.D.   History of Present Illness: Kristen Richard is a 84 y.o. female with a history of hypertension, atrial fibrillation admitted to the hospital on 06/02/2020 for persistent nausea, vomiting and reported  "watery diarrhea".  Though diarrhea has resolved, patient is still has some mild nausea and anorexic symptoms.  There is been no vomiting today and she is tolerating liquids.  The gastroenterology service has been asked to see the patient to consider GI evaluation for persistent nausea and vomiting. The patient has history of previous symptoms in February 2021 and underwent an EGD on December 27, 2019 by Dr.Kiran Tobi Bastos who found some mild to moderate gastritis as well as a mild benign intrinsic esophageal stenosis which was easily traversed with the endoscope.  There is no evidence of gastric outlet obstruction.  The patient remains nave to colonoscopy. The patient currently complains of some right upper quadrant pain and says that she has to hold her hand over the area to allow her to sleep.  She was diagnosed with gallstones on ultrasound but is not exhibiting any clinical symptoms or sonographic signs of cholecystitis.  A HIDA scan ordered in February 2021 was negative.  The patient does, however, have some complaints suspicious for biliary colic with postprandial nausea and right upper quadrant pain which abates with fasting.  The patient denies having an appetite and reports moderate amount of weight loss. The patient has history of of GERD symptoms and takes as needed antiacid at home.  Past Medical History:  Past Medical History:  Diagnosis Date  . DDD (degenerative disc disease), lumbar   . Fall    RISK  . GERD (gastroesophageal reflux disease)   . 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  Hypertension   . Intermittent atrial fibrillation (HCC)   . Scoliosis   . Vertigo     Problem List: Patient Active Problem List   Diagnosis Date Noted  . Disorder of electrolytes 06/02/2020  . Intractable vomiting   . AKI (acute kidney injury) (HCC) 12/25/2019  . Abdominal pain 12/25/2019  . Hypertension   . Hypokalemia   . GERD (gastroesophageal reflux disease)   . Fall   . Dehydration   . Pressure injury of skin 10/27/2019  . Rhabdomyolysis 10/26/2019  . Chronic kidney disease, stage II (mild) 10/29/2013  . Hematuria 09/24/2013    Past Surgical History: Past Surgical History:  Procedure Laterality Date  . CATARACT EXTRACTION W/PHACO Left 12/28/2018   Procedure: CATARACT EXTRACTION PHACO AND INTRAOCULAR LENS PLACEMENT (IOC) LEFT;  Surgeon: Elliot Cousin, MD;  Location: [facility] ORS;  Service: Ophthalmology;  Laterality: Left;  [place]   01:35 CDE 15.57 Fluid pack lot # [id number]  . ESOPHAGOGASTRODUODENOSCOPY (EGD) WITH PROPOFOL N/A 12/27/2019   Procedure: ESOPHAGOGASTRODUODENOSCOPY (EGD) WITH PROPOFOL;  Surgeon: Wyline Mood, MD;  Location: [facility] ENDOSCOPY;  Service: Gastroenterology;  Laterality: N/A;  . TUBAL LIGATION      Allergies: Allergies  Allergen Reactions  . Sulfa Antibiotics Swelling    Pt was told it caused her brain to swell - [facility] documented in 2014, pt is unaware of this   . Acetazolamide Nausea And Vomiting    Dizziness   . Hydrochlorothiazide     Home Medications: Medications Prior to Admission  Medication Sig Dispense Refill Last Dose  . atorvastatin (LIPITOR) 40 MG tablet Take 40 mg  by mouth daily.    06/01/2020 at Unknown time  . cholecalciferol (VITAMIN D3) 25 MCG (1000 UNIT) tablet Take 1,000 Units by mouth daily.   06/02/2020 at Unknown time  . magnesium oxide (MAG-OX) 400 (241.3 Mg) MG tablet Take 2 tablets (800 mg total) by mouth daily. 7 tablet 0 06/02/2020 at Unknown time  . Multiple Vitamin (MULTIVITAMIN WITH MINERALS) TABS tablet Take 1 tablet by mouth  daily. 30 tablet 0 06/02/2020 at Unknown time  . [EXPIRED] nitrofurantoin (MACRODANTIN) 100 MG capsule Take 100 mg by mouth 2 (two) times daily.   06/02/2020 at Unknown time  . pantoprazole (PROTONIX) 40 MG tablet Take 1 tablet (40 mg total) by mouth daily. 30 tablet 0 06/02/2020 at Unknown time  . phosphorus (K PHOS NEUTRAL) 155-852-130 MG tablet Take 2 tablets (500 mg total) by mouth 2 (two) times daily. 14 tablet 0 06/02/2020 at Unknown time  . acetaminophen (TYLENOL) 325 MG tablet Take 325-650 mg by mouth every 6 (six) hours as needed for moderate pain or headache.    PRN at PRN  . furosemide (LASIX) 20 MG tablet Take 20 mg by mouth daily as needed.    PRN at PRN  . meclizine (ANTIVERT) 12.5 MG tablet Take 1 tablet (12.5 mg total) by mouth 3 (three) times daily as needed for dizziness (Or vertigo). (Patient not taking: Reported on 06/02/2020) 30 tablet 0 Not Taking at Unknown time   Home medication reconciliation was completed with the patient.   Scheduled Inpatient Medications:   . atorvastatin  40 mg Oral Daily  . enoxaparin (LOVENOX) injection  40 mg Subcutaneous Q24H  . pantoprazole  40 mg Oral Daily    Continuous Inpatient Infusions:   . dextrose 5 % and 0.45 % NaCl with KCl 40 mEq/L 100 mL/hr at 06/04/20 0620    PRN Inpatient Medications:  acetaminophen, alum & mag hydroxide-simeth, calcium carbonate, docusate sodium, guaiFENesin-dextromethorphan, ondansetron (ZOFRAN) IV, ondansetron, polyethylene glycol  Family History: family history includes Diabetes in her brother and sister; Heart attack in her father.   GI Family History: Negative  Social History:   reports that she quit smoking about 17 years ago. She has quit using smokeless tobacco. She reports previous alcohol use. She reports that she does not use drugs. The patient denies ETOH, tobacco, or drug use.    Review of Systems: Review of Systems - History obtained from the patient General ROS: positive for  - weight  loss negative for - chills, fatigue or fever Psychological ROS: negative Ophthalmic ROS: negative ENT ROS: negative Allergy and Immunology ROS: negative Hematological and Lymphatic ROS: positive for - weight loss negative for - bleeding problems, blood clots, blood transfusions or bruising Endocrine ROS: negative Cardiovascular ROS: no chest pain or dyspnea on exertion Genito-Urinary ROS: no dysuria, trouble voiding, or hematuria Musculoskeletal ROS: positive for - joint pain and joint stiffness negative for - gait disturbance Neurological ROS: no TIA or stroke symptoms Dermatological ROS: negative  Physical Examination: BP (!) 133/68 (BP Location: Left Arm)   Pulse 103   Temp (!) 97.5 F (36.4 C) (Oral)   Resp 17   Ht 5\' 3"  (1.6 m)   Wt 54.7 kg   SpO2 100%   BMI 21.35 kg/m  Physical Exam Constitutional:      Appearance: She is not ill-appearing, toxic-appearing or diaphoretic.  HENT:     Head: Normocephalic and atraumatic.  Eyes:     Extraocular Movements: Extraocular movements intact.  Cardiovascular:  Heart sounds: Normal heart sounds.  Pulmonary:     Effort: Pulmonary effort is normal.  Abdominal:     General: Abdomen is flat. Bowel sounds are normal.     Palpations: Abdomen is soft. There is no shifting dullness, fluid wave, hepatomegaly or mass.     Tenderness: There is abdominal tenderness in the right upper quadrant. There is no guarding or rebound. Negative signs include Murphy's sign.     Hernia: No hernia is present.  Skin:    General: Skin is warm and dry.     Capillary Refill: Capillary refill takes less than 2 seconds.  Neurological:     Mental Status: She is alert. She is confused.  Psychiatric:        Mood and Affect: Mood normal.     Data: Lab Results  Component Value Date   WBC 6.3 06/04/2020   HGB 11.2 (L) 06/04/2020   HCT 33.9 (L) 06/04/2020   MCV 89.2 06/04/2020   PLT 235 06/04/2020   Recent Labs  Lab 06/02/20 1330 06/03/20 0458  06/04/20 0505  HGB 13.3 11.3* 11.2*   Lab Results  Component Value Date   NA 136 06/04/2020   K 2.7 (LL) 06/04/2020   CL 87 (L) 06/04/2020   CO2 37 (H) 06/04/2020   BUN 12 06/04/2020   CREATININE 0.77 06/04/2020   Lab Results  Component Value Date   ALT 17 06/02/2020   AST 43 (H) 06/02/2020   ALKPHOS 68 06/02/2020   BILITOT 2.6 (H) 06/02/2020   No results for input(s): APTT, INR, PTT in the last 168 hours. CBC Latest Ref Rng & Units 06/04/2020 06/03/2020 06/02/2020  WBC 4.0 - 10.5 K/uL 6.3 5.3 9.6  Hemoglobin 12.0 - 15.0 g/dL 11.2(L) 11.3(L) 13.3  Hematocrit 36 - 46 % 33.9(L) 33.3(L) 39.3  Platelets 150 - 400 K/uL 235 201 271    STUDIES: DG ESOPHAGUS W SINGLE CM (SOL OR THIN BA)  Result Date: 06/04/2020 CLINICAL DATA:  Regurgitation of food. EXAM: ESOPHOGRAM/BARIUM SWALLOW TECHNIQUE: Single contrast examination was performed using thin barium or water soluble. FLUOROSCOPY TIME:  Fluoroscopy Time:  54 seconds Number of Acquired Spot Images: 3 COMPARISON:  12/12/2010 chest radiograph. FINDINGS: Examination was limited secondary to limited patient mobility. Initial swallows demonstrated symptomatic aspiration. The study was subsequently aborted. IMPRESSION: Aborted barium swallow secondary to aspiration. Consider modified barium swallow to further assess aspiration risk. These results were called by telephone at the time of interpretation on 06/04/2020 at 12:46 pm to provider TINA LAI , who verbally acknowledged these results. Electronically Signed   By: Stana Buntinghikanele  Emekauwa M.D.   On: 06/04/2020 12:55   @IMAGES @  Assessment: Delete that 1.  Nausea and vomiting-differential diagnosis includes biliary colic, severe GERD, viral syndrome, medication side effect. 2.  Right upper quadrant tenderness-likely related to gallstone disease without active cholecystitis. 3.  GERD, stable 4.  Diarrhea, resolved. 5.  Mild aspiration risk without true aspiration noted on modified barium swallow  x-ray.  Patient is placed on a dysphagia stage II diet. 6.  Reported weight loss 7.  Significant metabolic derangement with electrolyte deficiencies including hypomagnesemia, hypokalemia  COVID-19 status: Tested negative   Recommendations: 1. Provide adequate IV resuscitation, hydration. 2. Correct metabolic derangement issues. (K+, Ca++) 3. Continue supportive symptomatic care. 4. Consider repeat HIDA and/or EGD for intractable symptoms. 5. Following.  Thank you for the consult. Please call with questions or concerns.  [place], Sunland Parkeodoro, "Mellody DanceKeith" MD [facility] [address] [place], [place]  [address] [phone]  06/04/2020 8:08 PM

## 2020-06-05 LAB — BASIC METABOLIC PANEL
Anion gap: 9 (ref 5–15)
BUN: 9 mg/dL (ref 8–23)
CO2: 33 mmol/L — ABNORMAL HIGH (ref 22–32)
Calcium: 8.1 mg/dL — ABNORMAL LOW (ref 8.9–10.3)
Chloride: 93 mmol/L — ABNORMAL LOW (ref 98–111)
Creatinine, Ser: 0.76 mg/dL (ref 0.44–1.00)
GFR calc Af Amer: >60 mL/min (ref >60)
GFR calc non Af Amer: >60 mL/min (ref >60)
Glucose, Bld: 106 mg/dL — ABNORMAL HIGH (ref 70–99)
Potassium: 3.5 mmol/L (ref 3.5–5.1)
Sodium: 135 mmol/L (ref 135–145)

## 2020-06-05 LAB — CBC
HCT: 33.5 % — ABNORMAL LOW (ref 36.0–46.0)
Hemoglobin: 11 g/dL — ABNORMAL LOW (ref 12.0–15.0)
MCH: 29.3 pg (ref 26.0–34.0)
MCHC: 32.8 g/dL (ref 30.0–36.0)
MCV: 89.3 fL (ref 80.0–100.0)
Platelets: 214 10*3/uL (ref 150–400)
RBC: 3.75 MIL/uL — ABNORMAL LOW (ref 3.87–5.11)
RDW: 15.6 % — ABNORMAL HIGH (ref 11.5–15.5)
WBC: 6.6 10*3/uL (ref 4.0–10.5)
nRBC: 0 % (ref 0.0–0.2)

## 2020-06-05 LAB — HEPATIC FUNCTION PANEL
ALT: 14 U/L (ref 0–44)
AST: 31 U/L (ref 15–41)
Albumin: 2.4 g/dL — ABNORMAL LOW (ref 3.5–5.0)
Alkaline Phosphatase: 57 U/L (ref 38–126)
Bilirubin, Direct: 0.4 mg/dL — ABNORMAL HIGH (ref 0.0–0.2)
Indirect Bilirubin: 1.1 mg/dL — ABNORMAL HIGH (ref 0.3–0.9)
Total Bilirubin: 1.5 mg/dL — ABNORMAL HIGH (ref 0.3–1.2)
Total Protein: 5.4 g/dL — ABNORMAL LOW (ref 6.5–8.1)

## 2020-06-05 LAB — PHOSPHORUS: Phosphorus: 1.7 mg/dL — ABNORMAL LOW (ref 2.5–4.6)

## 2020-06-05 LAB — GLUCOSE, CAPILLARY
Glucose-Capillary: 98 mg/dL (ref 70–99)
Glucose-Capillary: 93 mg/dL (ref 70–99)
Glucose-Capillary: 89 mg/dL (ref 70–99)

## 2020-06-05 LAB — MAGNESIUM: Magnesium: 1.8 mg/dL (ref 1.7–2.4)

## 2020-06-05 MED ORDER — POTASSIUM PHOSPHATES 15 MMOLE/5ML IV SOLN
30.0000 mmol | Freq: Once | INTRAVENOUS | Status: AC
  Administered 2020-06-05: 30 mmol via INTRAVENOUS
  Filled 2020-06-05: qty 10

## 2020-06-05 NOTE — Progress Notes (Signed)
[facility] SURGICAL PROGRESS NOTE (cpt [phone])  Hospital Day(s): 3.   Interval History: Patient seen and examined, no acute events or new complaints overnight. Patient this morning reports that she is having trouble eating. States that she believes it is the taste but "also might just be her." Difficult to ascertain exactly what she is complaining about. She continues to endorse upper abdominal pain primarily in epigastric and RUQ region however she denied any association with eating. Labs are reassuring, LFTs pending. No fever, chills, nausea, emesis. GI following as well   Review of Systems:  Constitutional: denies fever, chills. + decreased appetite HEENT: denies cough or congestion  Respiratory: denies any shortness of breath  Cardiovascular: denies chest pain or palpitations  Gastrointestinal: + abdominal pain, denied N/V, or diarrhea/and bowel function as per interval history Genitourinary: denies burning with urination or urinary frequency   Vital signs in last 24 hours: [min-max] current  Temp:  [97.5 F (36.4 C)-98.4 F (36.9 C)] 97.5 F (36.4 C) (07/29 0824) Pulse Rate:  [94-103] 97 (07/29 0824) Resp:  [17-18] 18 (07/29 0824) BP: (121-133)/(66-81) 128/81 (07/29 0824) SpO2:  [98 %-100 %] 98 % (07/29 0824)     Height: 5\' 3"  (160 cm) Weight: 54.7 kg BMI (Calculated): 21.35   Intake/Output last 2 shifts:  07/28 0701 - 07/29 0700 In: 2101.8 [I.V.:2101.8] Out: -    Physical Exam:  Constitutional: alert, cooperative and no distress  HENT: normocephalic without obvious abnormality  Eyes: PERRL, EOM's grossly intact and symmetric  Respiratory: breathing non-labored at rest  Cardiovascular: regular rate and sinus rhythm  Gastrointestinal: Soft, does have soreness in epigastrium and RUQ, and non-distended, no rebound/guarding, negative Murphy's Sign Musculoskeletal: No edema or wounds, motor and sensation grossly intact, NT    Labs:  CBC Latest Ref Rng &  Units 06/05/2020 06/04/2020 06/03/2020  WBC 4.0 - 10.5 K/uL 6.6 6.3 5.3  Hemoglobin 12.0 - 15.0 g/dL 11.0(L) 11.2(L) 11.3(L)  Hematocrit 36 - 46 % 33.5(L) 33.9(L) 33.3(L)  Platelets 150 - 400 K/uL 214 235 201   CMP Latest Ref Rng & Units 06/05/2020 06/04/2020 06/03/2020  Glucose 70 - 99 mg/dL 06/05/2020) 82 413(K)  BUN 8 - 23 mg/dL 9 12 16   Creatinine 0.44 - 1.00 mg/dL 440(N 0.27  Sodium 135 - 145 mmol/L 135 136 136  Potassium 3.5 - 5.1 mmol/L 3.5 2.7(LL) 3.1(L)  Chloride 98 - 111 mmol/L 93(L) 87(L) 87(L)  CO2 22 - 32 mmol/L 33(H) 37(H) 38(H)  Calcium 8.9 - 10.3 mg/dL 8.1(L) 8.1(L) 7.7(L)  Total Protein 6.5 - 8.1 g/dL - - -  Total Bilirubin 0.3 - 1.2 mg/dL - - -  Alkaline Phos 38 - 126 U/L - - -  AST 15 - 41 U/L - - -  ALT 0 - 44 U/L - - -     Imaging studies: No new pertinent imaging studies   Assessment/Plan: (ICD-10's: K80.20) 84 y.o. female with persistent upper abdominal discomfort and difficulity with PO intake of uncertain etiology. There may be some component of biliary colic -vs- symptomatic cholelithiasis however there is no evidence of cholecystitis or choledocholithiasis. She is being followed by GI and has history of gastritis and benign esophageal stenosis, and we would like to ensure GI work up complete prior to considering cholecystectomy as there are no urgent indications for this at this time.     - Complete GI work up as mentioned above, appreciate their input  - Will add LFTs to ensure no elevation or  hyperbilirubinemia   - If above negative, we will consider choelcystectomy   - further management per primary service; we will follow    All of the above findings and recommendations were discussed with the patient, and the medical team, and all of patient's questions were answered to her expressed satisfaction.   -- Lynden Oxford, PA-C [facility] 06/05/2020, 8:52 AM [phone] M-F: 7am - 4pm

## 2020-06-05 NOTE — Care Management Important Message (Signed)
Important Message  Patient Details  Name: Alima Naser MRN: [id number] Date of Birth: 1936-08-09   Medicare Important Message Given:  Yes     Olegario Messier A Karaline Buresh 06/05/2020, 11:08 AM

## 2020-06-05 NOTE — TOC Progression Note (Signed)
Transition of Care [facility]) - Progression Note    Patient Details  Name: Kristen Richard MRN: [id number] Date of Birth: July 04, 1936  Transition of [facility]) CM/SW Contact  Allayne Butcher, RN Phone Number: 06/05/2020, 2:40 PM  Clinical Narrative:    RNCM was able to speak with patient today, she is awake and alert.  Patient reports that she wants to go home and will not even consider SNF.  Patient has had [facility] in the past for home health services, she would like to use them again.  Christy Gentles with [facility] has accepted the referral for RN, PT, OT, and aide.     Expected Discharge Plan: Home w Home Health Services Barriers to Discharge: Continued Medical Work up  Expected Discharge Plan and Services Expected Discharge Plan: Home w Home Health Services   Discharge Planning Services: CM Consult Post Acute Care Choice: Home Health Living arrangements for the past 2 months: Single Family Home                             HH Agency: [facility]         Social Determinants of Health (SDOH) Interventions    Readmission Risk Interventions No flowsheet data found.

## 2020-06-05 NOTE — Progress Notes (Addendum)
GI Inpatient Follow-up Note  Subjective:  Patient seen in follow-up for nausea and vomiting, RUQ abdominal pain. No acute overnight events. No recurrent episodes of vomiting. She denies any nausea currently. She notes trouble eating - "I have no appetite." She does report discomfort in her RUQ and epigastric region. She denies any fever, dysphagia, odynophagia, or early satiety. No bowel movements since yesterday. No new complaints.   Scheduled Inpatient Medications:  . atorvastatin  40 mg Oral Daily  . enoxaparin (LOVENOX) injection  40 mg Subcutaneous Q24H  . pantoprazole  40 mg Oral Daily    Continuous Inpatient Infusions:   . dextrose 5 % and 0.45 % NaCl with KCl 40 mEq/L 100 mL/hr at 06/05/20 0400    PRN Inpatient Medications:  acetaminophen, alum & mag hydroxide-simeth, calcium carbonate, docusate sodium, guaiFENesin-dextromethorphan, ondansetron (ZOFRAN) IV, ondansetron, polyethylene glycol  Review of Systems: Constitutional: Weight is stable.  Eyes: No changes in vision. ENT: No oral lesions, sore throat.  GI: see HPI.  Heme/Lymph: No easy bruising.  CV: No chest pain.  GU: No hematuria.  Integumentary: No rashes.  Neuro: No headaches.  Psych: No depression/anxiety.  Endocrine: No heat/cold intolerance.  Allergic/Immunologic: No urticaria.  Resp: No cough, SOB.  Musculoskeletal: No joint swelling.    Physical Examination: BP (!) 110/51 (BP Location: Left Arm)   Pulse 95   Temp 98.1 F (36.7 C) (Oral)   Resp 16   Ht 5\' 3"  (1.6 m)   Wt 54.7 kg   SpO2 100%   BMI 21.35 kg/m  Gen: NAD, alert and oriented x 4 HEENT: PEERLA, EOMI, Neck: supple, no JVD or thyromegaly Chest: CTA bilaterally, no wheezes, crackles, or other adventitious sounds CV: RRR, no m/g/c/r Abd: soft, ND, +BS in all four quadrants; tenderness to deep palpation in epigastrium and RUQ, no HSM, guarding, ridigity, or rebound tenderness Ext: no edema, well perfused with 2+ pulses, Skin: no rash or  lesions noted Lymph: no LAD  Data: Lab Results  Component Value Date   WBC 6.6 06/05/2020   HGB 11.0 (L) 06/05/2020   HCT 33.5 (L) 06/05/2020   MCV 89.3 06/05/2020   PLT 214 06/05/2020   Recent Labs  Lab 06/03/20 0458 06/04/20 0505 06/05/20 0513  HGB 11.3* 11.2* 11.0*   Lab Results  Component Value Date   NA 135 06/05/2020   K 3.5 06/05/2020   CL 93 (L) 06/05/2020   CO2 33 (H) 06/05/2020   BUN 9 06/05/2020   CREATININE 0.76 06/05/2020   Lab Results  Component Value Date   ALT 14 06/05/2020   AST 31 06/05/2020   ALKPHOS 57 06/05/2020   BILITOT 1.5 (H) 06/05/2020   No results for input(s): APTT, INR, PTT in the last 168 hours. Assessment/Plan:  Assessment:  1.  Nausea and vomiting-differential diagnosis includes biliary colic, severe GERD, viral syndrome, medication side effect.  - Improved from yesterday, no recurrent emesis  2.  Right upper quadrant tenderness-likely related to gallstone disease without active cholecystitis versus known history of gastritis   3.  GERD, stable - on Protonix 40 mg PO qd  4.  Diarrhea, resolved.  5.  Mild aspiration risk without true aspiration noted on modified barium swallow x-ray.  Patient is placed on a dysphagia stage II diet.  6.  Reported weight loss  7.  Significant metabolic derangement - hypokalemia and hypomagnesemia resolved with supplementation  COVID-19 status:        Tested negative   Recommendations: 1. No recurrent nausea  or vomiting at present time, but still with RUQ and epigastric tenderness 2. Metabolic derangement (K+, Mg) resolved 3. Continue supportive symptomatic care 4. Plan for EGD tomorrow afternoon with Dr. Norma Fredrickson for further luminal evaluation 5. Continue Dysphagia 2 diet 6. NPO after midnight 7. Further recommendations after procedure tomorrow  I reviewed the risks (including bleeding, perforation, infection, anesthesia complications, cardiac/respiratory complications), benefits and  alternatives of EGD. Patient consents to proceed.   I discussed plan of care with patient's daughter - [name] who is in agreement with above plan of care.   Please call with questions or concerns.    Jacob Moores, PA-C [facility]astroenter[facility] [phone] (Cell)

## 2020-06-05 NOTE — Progress Notes (Addendum)
PROGRESS NOTE    Kristen Richard  KGY:[id number] DOB: 12-08-1935 DOA: 06/02/2020 PCP: Center, [facility]    Assessment & Plan:   Active Problems:   Disorder of electrolytes    Kristen Richard is a 84 y.o. female with medical history significant of afib not on anticoagulation, gastritis who presented with N/V/D and abdominal pain.   # N/V diarrhea and abdominal pain # Cholelithiasis # Hx of benign esophageal stenosis --Pt did have a hospitalization for N/V in Feb 2021, however, gallbladder was normal at that time.  Current symptoms maybe due to cholelithiasis.  Not septic, no signs of cholecystitis.   --Modified barium study showed no overt aspiration PLAN:  --GI plan to perform EGD tomorrow --GenSurg to consider cholecystectomy if GI workup neg. --continue MIVF for hydrationsince oral intake is poor  # Hypophos --2/2 poor nutrition --Replete with IV phos  # Hypokalemia and Hypomag --from vomiting and diarrhea and inadequate PO intake.   PLAN: --IV mag PRN --IV potassium PRN (due to inability to keep pills down)  # Hypoglycemia --from poor PO intake and prolonged lack of nutrition --s/p D5 1/2 NS with K 80 mEq@100  ml/hr for 10 hours PLAN: --continue D5 1/2 NS with K 40 mEq, decrease to 50 ml/hr --BG q6h and hypoglycemic protocol.  # Mild trop elevation --likely due to demand ischemia   # Persistent Afib not on anticoagulation --Last seen by cardiology on 05/26/20.  --CHA2DS2-VASc score of 4 (age, htn, gender).Previously on beta-blocker/Lopressor which caused sinus pauses hence beta-blocker was stopped. Due to frequent falls, not on anticoagulation.  # History of hyperlipidemia --continue statin.  # History of hypertension --blood pressure controlled off blood pressure meds.   # Lateral lower extremity edema --edema improved since presentation. --Hold home Lasix for now since pt is needing MIVF   DVT prophylaxis: Lovenox SQ Code Status: Full  code  Family Communication: granddaughter updated at bedside today Status is: inpatient Dispo:   The patient is from: home Anticipated d/c is to: home.  Pt refused SNF rehab. Anticipated d/c date is: unclear Patient currently is not medically stable to d/c due to: not taking in enough oral nutrition and hydration to maintain electrolytes and BG, need GI workup, symptomatic cholelithiasis that may require surgery.   Subjective and Interval History:  Pt was sleeping a lot today.  Haven't gotten out of bed.  Took pills without problem today.   Objective: Vitals:   06/04/20 2317 06/05/20 0824 06/05/20 1234 06/05/20 1831  BP: 121/66 128/81 (!) 110/51 (!) 113/56  Pulse: 94 97 95 101  Resp: 18 18 16 16   Temp: 98.4 F (36.9 C) (!) 97.5 F (36.4 C) 98.1 F (36.7 C) 98 F (36.7 C)  TempSrc: Oral Oral Oral Oral  SpO2:  98% 100% 100%  Weight:      Height:        Intake/Output Summary (Last 24 hours) at 06/05/2020 1906 Last data filed at 06/05/2020 1834 Gross per 24 hour  Intake 2101.78 ml  Output 1000 ml  Net 1101.78 ml   Filed Weights   06/02/20 1326 06/03/20 2040  Weight: 52.6 kg 54.7 kg    Examination:   Constitutional: NAD, lethargic, oriented, though somewhat confused with faulty memory HEENT: conjunctivae and lids normal, EOMI CV: RRR no M,R,G. Distal pulses +2.  No cyanosis.   RESP: CTA B/L, normal respiratory effort  GI: +BS, ND, NT, soft Extremities: No effusions, edema, or tenderness in BLE SKIN: warm, dry and intact Neuro: II -  XII grossly intact.  Sensation intact   Data Reviewed: I have personally reviewed following labs and imaging studies  CBC: Recent Labs  Lab 06/02/20 1330 06/03/20 0458 06/04/20 0505 06/05/20 0513  WBC 9.6 5.3 6.3 6.6  HGB 13.3 11.3* 11.2* 11.0*  HCT 39.3 33.3* 33.9* 33.5*  MCV 87.5 88.1 89.2 89.3  PLT 271 201 235 214   Basic Metabolic Panel: Recent Labs  Lab 06/02/20 1330 06/02/20 1700 06/03/20 0458 06/04/20 0505  06/05/20 0513  NA 136  --  136 136 135  K <2.0*  --  3.1* 2.7* 3.5  CL 81*  --  87* 87* 93*  CO2 34*  --  38* 37* 33*  GLUCOSE 66*  --  111* 82 106*  BUN 20  --  16 12 9   CREATININE 1.00  --  0.84 0.77 0.76  CALCIUM 7.9*  --  7.7* 8.1* 8.1*  MG 1.3*  --  2.5* 2.2 1.8  PHOS  --  2.1*  --  2.4* 1.7*   GFR: Estimated Creatinine Clearance: 45.6 mL/min (by C-G formula based on SCr of 0.76 mg/dL). Liver Function Tests: Recent Labs  Lab 06/02/20 1330 06/05/20 0513  AST 43* 31  ALT 17 14  ALKPHOS 68 57  BILITOT 2.6* 1.5*  PROT 6.9 5.4*  ALBUMIN 3.1* 2.4*   Recent Labs  Lab 06/02/20 1330  LIPASE 26   No results for input(s): AMMONIA in the last 168 hours. Coagulation Profile: No results for input(s): INR, PROTIME in the last 168 hours. Cardiac Enzymes: No results for input(s): CKTOTAL, CKMB, CKMBINDEX, TROPONINI in the last 168 hours. BNP (last 3 results) No results for input(s): PROBNP in the last 8760 hours. HbA1C: No results for input(s): HGBA1C in the last 72 hours. CBG: Recent Labs  Lab 06/04/20 1719 06/04/20 2000 06/05/20 0534 06/05/20 0751 06/05/20 1313  GLUCAP 114* 110* 98 93 89   Lipid Profile: No results for input(s): CHOL, HDL, LDLCALC, TRIG, CHOLHDL, LDLDIRECT in the last 72 hours. Thyroid Function Tests: No results for input(s): TSH, T4TOTAL, FREET4, T3FREE, THYROIDAB in the last 72 hours. Anemia Panel: No results for input(s): VITAMINB12, FOLATE, FERRITIN, TIBC, IRON, RETICCTPCT in the last 72 hours. Sepsis Labs: No results for input(s): PROCALCITON, LATICACIDVEN in the last 168 hours.  Recent Results (from the past 240 hour(s))  SARS Coronavirus 2 by RT PCR (hospital order, performed in [facility] hospital lab) Nasopharyngeal Nasopharyngeal Swab     Status: None   Collection Time: 06/02/20  2:34 PM   Specimen: Nasopharyngeal Swab  Result Value Ref Range Status   SARS Coronavirus 2 NEGATIVE NEGATIVE Final    Comment: (NOTE) SARS-CoV-2 target  nucleic acids are NOT DETECTED.  The SARS-CoV-2 RNA is generally detectable in upper and lower respiratory specimens during the acute phase of infection. The lowest concentration of SARS-CoV-2 viral copies this assay can detect is 250 copies / mL. A negative result does not preclude SARS-CoV-2 infection and should not be used as the sole basis for treatment or other patient management decisions.  A negative result may occur with improper specimen collection / handling, submission of specimen other than nasopharyngeal swab, presence of viral mutation(s) within the areas targeted by this assay, and inadequate number of viral copies (<250 copies / mL). A negative result must be combined with clinical observations, patient history, and epidemiological information.  Fact Sheet for Patients:   06/04/20  Fact Sheet for Healthcare Providers: BoilerBrush.com.cy  This test is not yet approved or  cleared by the [place] and has been authorized for detection and/or diagnosis of SARS-CoV-2 by FDA under an Emergency Use Authorization (EUA).  This EUA will remain in effect (meaning this test can be used) for the duration of the COVID-19 declaration under Section 564(b)(1) of the Act, 21 U.S.C. section 360bbb-3(b)(1), unless the authorization is terminated or revoked sooner.  Performed at [facility], [address], [place], [place] [address]       Radiology Studies: DG ESOPHAGUS W SINGLE CM (SOL OR THIN BA)  Result Date: 06/04/2020 CLINICAL DATA:  Regurgitation of food. EXAM: ESOPHOGRAM/BARIUM SWALLOW TECHNIQUE: Single contrast examination was performed using thin barium or water soluble. FLUOROSCOPY TIME:  Fluoroscopy Time:  54 seconds Number of Acquired Spot Images: 3 COMPARISON:  12/12/2010 chest radiograph. FINDINGS: Examination was limited secondary to limited patient mobility. Initial swallows demonstrated  symptomatic aspiration. The study was subsequently aborted. IMPRESSION: Aborted barium swallow secondary to aspiration. Consider modified barium swallow to further assess aspiration risk. These results were called by telephone at the time of interpretation on 06/04/2020 at 12:46 pm to provider Jasmia Angst , who verbally acknowledged these results. Electronically Signed   By: Stana Bunting M.D.   On: 06/04/2020 12:55     Scheduled Meds: . atorvastatin  40 mg Oral Daily  . enoxaparin (LOVENOX) injection  40 mg Subcutaneous Q24H  . pantoprazole  40 mg Oral Daily   Continuous Infusions: . dextrose 5 % and 0.45 % NaCl with KCl 40 mEq/L 100 mL/hr at 06/05/20 0400     LOS: 3 days     Darlin Priestly, MD [facility] If 7PM-7AM, please contact night-coverage 06/05/2020, 7:06 PM

## 2020-06-06 LAB — BASIC METABOLIC PANEL WITH GFR
Anion gap: 8 (ref 5–15)
BUN: 6 mg/dL — ABNORMAL LOW (ref 8–23)
CO2: 27 mmol/L (ref 22–32)
Calcium: 8 mg/dL — ABNORMAL LOW (ref 8.9–10.3)
Chloride: 101 mmol/L (ref 98–111)
Creatinine, Ser: 0.63 mg/dL (ref 0.44–1.00)
GFR calc Af Amer: >60 mL/min
GFR calc non Af Amer: >60 mL/min
Glucose, Bld: 82 mg/dL (ref 70–99)
Potassium: 5.3 mmol/L — ABNORMAL HIGH (ref 3.5–5.1)
Sodium: 136 mmol/L (ref 135–145)

## 2020-06-06 LAB — GLUCOSE, CAPILLARY
Glucose-Capillary: 79 mg/dL (ref 70–99)
Glucose-Capillary: 81 mg/dL (ref 70–99)
Glucose-Capillary: 91 mg/dL (ref 70–99)

## 2020-06-06 LAB — CBC
HCT: 32.9 % — ABNORMAL LOW (ref 36.0–46.0)
Hemoglobin: 11 g/dL — ABNORMAL LOW (ref 12.0–15.0)
MCH: 29.9 pg (ref 26.0–34.0)
MCHC: 33.4 g/dL (ref 30.0–36.0)
MCV: 89.4 fL (ref 80.0–100.0)
Platelets: 202 10*3/uL (ref 150–400)
RBC: 3.68 MIL/uL — ABNORMAL LOW (ref 3.87–5.11)
RDW: 16.3 % — ABNORMAL HIGH (ref 11.5–15.5)
WBC: 5.2 10*3/uL (ref 4.0–10.5)
nRBC: 0 % (ref 0.0–0.2)

## 2020-06-06 LAB — PHOSPHORUS: Phosphorus: 3.5 mg/dL (ref 2.5–4.6)

## 2020-06-06 LAB — MAGNESIUM: Magnesium: 1.4 mg/dL — ABNORMAL LOW (ref 1.7–2.4)

## 2020-06-06 MED ORDER — MAGNESIUM SULFATE 2 GM/50ML IV SOLN
2.0000 g | Freq: Once | INTRAVENOUS | Status: AC
  Administered 2020-06-06: 18:00:00 2 g via INTRAVENOUS
  Filled 2020-06-06: qty 50

## 2020-06-06 MED ORDER — PROPOFOL 10 MG/ML IV BOLUS
INTRAVENOUS | Status: AC
  Filled 2020-06-06: qty 20

## 2020-06-06 NOTE — H&P (View-Only) (Signed)
Inpatient GI progress note  Patient ate today. Procedure delayed until tomorrow. Clinically stable.  AFVSS  Impression:  1. Nausea and vomiting - Stable, intermittent. 2. Abdominal pain 3. Hx esophageal stenosis.  Recommendation:  1. Resume full liquid diet as tolerated. 2. EGD tomorrow.   T. Keith Roan Sawchuk, M.D. ABIM Diplomate in Gastroenterology [facility]     

## 2020-06-06 NOTE — Progress Notes (Signed)
PROGRESS NOTE    Creasie Lacosse  YIF:[id number] DOB: 11/16/1935 DOA: 06/02/2020 PCP: [facility], [facility]    Assessment & Plan:   Active Problems:   Disorder of electrolytes    Jamilia Jacques is a 84 y.o. female with medical history significant of afib not on anticoagulation, gastritis who presented with N/V/D and abdominal pain.   # N/V diarrhea and abdominal pain # Cholelithiasis # Hx of benign esophageal stenosis --Pt did have a hospitalization for N/V in Feb 2021, however, gallbladder was normal at that time.  Current symptoms maybe due to cholelithiasis.  Not septic, no signs of cholecystitis.   --Modified barium study showed no overt aspiration PLAN:  --GI plan to perform EGD tomorrow --GenSurg to consider cholecystectomy if GI workup neg. --continue MIVF for hydrationsince oral intake is poor  # Hypophos --2/2 poor nutrition --Replete with IV phos  # Hypokalemia and Hypomag --from vomiting and diarrhea and inadequate PO intake.   PLAN: --IV mag PRN --IV potassium PRN (due to inability to keep pills down)  # Hypoglycemia --from poor PO intake and prolonged lack of nutrition --s/p D5 1/2 NS with K 80 mEq@100  ml/hr for 10 hours PLAN: --change to D5 NS@50  --BG q6h and hypoglycemic protocol.  # Mild trop elevation --likely due to demand ischemia   # Persistent Afib not on anticoagulation --Last seen by cardiology on 05/26/20.  --CHA2DS2-VASc score of 4 (age, htn, gender).Previously on beta-blocker/Lopressor which caused sinus pauses hence beta-blocker was stopped. Due to frequent falls, not on anticoagulation.  # History of hyperlipidemia --continue statin.  # History of hypertension --blood pressure controlled off blood pressure meds.   # Lateral lower extremity edema --edema improved since presentation. --Hold home Lasix for now since pt is needing MIVF   DVT prophylaxis: Lovenox SQ Code Status: Full code  Family Communication:  granddaughter updated at bedside today Status is: inpatient Dispo:   The patient is from: home Anticipated d/c is to: home.  Pt refused SNF rehab. Anticipated d/c date is: unclear Patient currently is not medically stable to d/c due to: not taking in enough oral nutrition and hydration to maintain electrolytes and BG, need GI workup, symptomatic cholelithiasis that may require surgery.   Subjective and Interval History:  EGD canceled today due to nursing have given pt ice cream with her morning pills.    Objective: Vitals:   06/06/20 1126 06/06/20 1641 06/06/20 2017 06/06/20 2338  BP: (!) 134/66 (!) 155/65 (!) 132/62 (!) 140/70  Pulse: 94 101 92 104  Resp: 18 16 16 17   Temp:  98.2 F (36.8 C) 98.6 F (37 C) 97.7 F (36.5 C)  TempSrc:  Oral Oral Oral  SpO2: 99% 96% 100% 99%  Weight:      Height:        Intake/Output Summary (Last 24 hours) at 06/07/2020 0339 Last data filed at 06/06/2020 2018 Gross per 24 hour  Intake --  Output 1550 ml  Net -1550 ml   Filed Weights   06/02/20 1326 06/03/20 2040  Weight: 52.6 kg 54.7 kg    Examination:   Constitutional: NAD, lethargic, oriented, though somewhat confused with faulty memory HEENT: conjunctivae and lids normal, EOMI CV: RRR no M,R,G. Distal pulses +2.  No cyanosis.   RESP: CTA B/L, normal respiratory effort  GI: +BS, ND, NT, soft Extremities: No effusions, edema, or tenderness in BLE SKIN: warm, dry and intact Neuro: II - XII grossly intact.  Sensation intact   Data Reviewed: I have personally  reviewed following labs and imaging studies  CBC: Recent Labs  Lab 06/02/20 1330 06/03/20 0458 06/04/20 0505 06/05/20 0513 06/06/20 1003  WBC 9.6 5.3 6.3 6.6 5.2  HGB 13.3 11.3* 11.2* 11.0* 11.0*  HCT 39.3 33.3* 33.9* 33.5* 32.9*  MCV 87.5 88.1 89.2 89.3 89.4  PLT 271 201 235 214 202   Basic Metabolic Panel: Recent Labs  Lab 06/02/20 1330 06/02/20 1700 06/03/20 0458 06/04/20 0505 06/05/20 0513 06/06/20 1003   NA 136  --  136 136 135 136  K <2.0*  --  3.1* 2.7* 3.5 5.3*  CL 81*  --  87* 87* 93* 101  CO2 34*  --  38* 37* 33* 27  GLUCOSE 66*  --  111* 82 106* 82  BUN 20  --  16 12 9  6*  CREATININE 1.00  --  0.84 0.77 0.76 0.63  CALCIUM 7.9*  --  7.7* 8.1* 8.1* 8.0*  MG 1.3*  --  2.5* 2.2 1.8 1.4*  PHOS  --  2.1*  --  2.4* 1.7* 3.5   GFR: Estimated Creatinine Clearance: 45.6 mL/min (by C-G formula based on SCr of 0.63 mg/dL). Liver Function Tests: Recent Labs  Lab 06/02/20 1330 06/05/20 0513  AST 43* 31  ALT 17 14  ALKPHOS 68 57  BILITOT 2.6* 1.5*  PROT 6.9 5.4*  ALBUMIN 3.1* 2.4*   Recent Labs  Lab 06/02/20 1330  LIPASE 26   No results for input(s): AMMONIA in the last 168 hours. Coagulation Profile: No results for input(s): INR, PROTIME in the last 168 hours. Cardiac Enzymes: No results for input(s): CKTOTAL, CKMB, CKMBINDEX, TROPONINI in the last 168 hours. BNP (last 3 results) No results for input(s): PROBNP in the last 8760 hours. HbA1C: No results for input(s): HGBA1C in the last 72 hours. CBG: Recent Labs  Lab 06/05/20 0751 06/05/20 1313 06/06/20 0004 06/06/20 0616 06/06/20 1820  GLUCAP 93 89 79 81 91   Lipid Profile: No results for input(s): CHOL, HDL, LDLCALC, TRIG, CHOLHDL, LDLDIRECT in the last 72 hours. Thyroid Function Tests: No results for input(s): TSH, T4TOTAL, FREET4, T3FREE, THYROIDAB in the last 72 hours. Anemia Panel: No results for input(s): VITAMINB12, FOLATE, FERRITIN, TIBC, IRON, RETICCTPCT in the last 72 hours. Sepsis Labs: No results for input(s): PROCALCITON, LATICACIDVEN in the last 168 hours.  Recent Results (from the past 240 hour(s))  SARS Coronavirus 2 by RT PCR (hospital order, performed in [facility] hospital lab) Nasopharyngeal Nasopharyngeal Swab     Status: None   Collection Time: 06/02/20  2:34 PM   Specimen: Nasopharyngeal Swab  Result Value Ref Range Status   SARS Coronavirus 2 NEGATIVE NEGATIVE Final    Comment:  (NOTE) SARS-CoV-2 target nucleic acids are NOT DETECTED.  The SARS-CoV-2 RNA is generally detectable in upper and lower respiratory specimens during the acute phase of infection. The lowest concentration of SARS-CoV-2 viral copies this assay can detect is 250 copies / mL. A negative result does not preclude SARS-CoV-2 infection and should not be used as the sole basis for treatment or other patient management decisions.  A negative result may occur with improper specimen collection / handling, submission of specimen other than nasopharyngeal swab, presence of viral mutation(s) within the areas targeted by this assay, and inadequate number of viral copies (<250 copies / mL). A negative result must be combined with clinical observations, patient history, and epidemiological information.  Fact Sheet for Patients:   06/04/20  Fact Sheet for Healthcare Providers: BoilerBrush.com.cy  This test  is not yet approved or  cleared by the [place] and has been authorized for detection and/or diagnosis of SARS-CoV-2 by FDA under an Emergency Use Authorization (EUA).  This EUA will remain in effect (meaning this test can be used) for the duration of the COVID-19 declaration under Section 564(b)(1) of the Act, 21 U.S.C. section 360bbb-3(b)(1), unless the authorization is terminated or revoked sooner.  Performed at [facility], [address], [place], [place] [address]       Radiology Studies: No results found.   Scheduled Meds: . atorvastatin  40 mg Oral Daily  . enoxaparin (LOVENOX) injection  40 mg Subcutaneous Q24H  . pantoprazole  40 mg Oral Daily   Continuous Infusions: . sodium chloride       LOS: 5 days     Darlin Priestly, MD [facility] If 7PM-7AM, please contact night-coverage 06/07/2020, 3:39 AM

## 2020-06-06 NOTE — Progress Notes (Signed)
Inpatient GI progress note  Patient ate today. Procedure delayed until tomorrow. Clinically stable.  AFVSS  Impression:  1. Nausea and vomiting - Stable, intermittent. 2. Abdominal pain 3. Hx esophageal stenosis.  Recommendation:  1. Resume full liquid diet as tolerated. 2. EGD tomorrow.   George Ina, M.D. ABIM Diplomate in Gastroenterology [facility]

## 2020-06-06 NOTE — Progress Notes (Signed)
[facility] SURGICAL PROGRESS NOTE (cpt [phone])  Hospital Day(s): 4.   Interval History: Patient seen and examined, no acute events or new complaints overnight. Patient reports relatively unchanged abdominal discomfort in RUQ/Epigastrium, does not appear to uncomfortable while discussion, denies fever, chills, nausea, or emesis. Still with decreased appetite. No new labs or imaging this morning. She is NPO with plans for EGD today with Dr Norma Fredrickson.   Review of Systems:  Constitutional: denies fever, chills, + decreased appetite  HEENT: denies cough or congestion  Respiratory: denies any shortness of breath  Cardiovascular: denies chest pain or palpitations  Gastrointestinal: + abdominal pain, denied N/V, or diarrhea/and bowel function as per interval history Genitourinary: denies burning with urination or urinary frequency   Vital signs in last 24 hours: [min-max] current  Temp:  [97.5 F (36.4 C)-98.4 F (36.9 C)] 98.4 F (36.9 C) (07/30 0503) Pulse Rate:  [88-101] 88 (07/30 0503) Resp:  [16-18] 16 (07/29 1831) BP: (110-137)/(51-81) 137/64 (07/30 0503) SpO2:  [98 %-100 %] 100 % (07/30 0503)     Height: 5\' 3"  (160 cm) Weight: 54.7 kg BMI (Calculated): 21.35   Intake/Output last 2 shifts:  07/29 0701 - 07/30 0700 In: -  Out: 1800 [Urine:1800]   Physical Exam:  Constitutional: alert, cooperative and no distress  HENT: normocephalic without obvious abnormality  Eyes: PERRL, EOM's grossly intact and symmetric  Respiratory: breathing non-labored at rest  Cardiovascular: regular rate and sinus rhythm  Gastrointestinal: Soft, does have soreness in epigastrium and RUQ which is unchanged although this does not appear overly bothersome, and non-distended, no rebound/guarding, negative Murphy's Sign Musculoskeletal: No edema or wounds, motor and sensation grossly intact, NT    Labs:  CBC Latest Ref Rng & Units 06/05/2020 06/04/2020 06/03/2020  WBC 4.0 - 10.5 K/uL 6.6 6.3  5.3  Hemoglobin 12.0 - 15.0 g/dL 11.0(L) 11.2(L) 11.3(L)  Hematocrit 36 - 46 % 33.5(L) 33.9(L) 33.3(L)  Platelets 150 - 400 K/uL 214 235 201   CMP Latest Ref Rng & Units 06/05/2020 06/04/2020 06/03/2020  Glucose 70 - 99 mg/dL 06/05/2020) 82 194(R)  BUN 8 - 23 mg/dL 9 12 16   Creatinine 0.44 - 1.00 mg/dL 740(C 1.44  Sodium 135 - 145 mmol/L 135 136 136  Potassium 3.5 - 5.1 mmol/L 3.5 2.7(LL) 3.1(L)  Chloride 98 - 111 mmol/L 93(L) 87(L) 87(L)  CO2 22 - 32 mmol/L 33(H) 37(H) 38(H)  Calcium 8.9 - 10.3 mg/dL 8.1(L) 8.1(L) 7.7(L)  Total Protein 6.5 - 8.1 g/dL 8.18) - -  Total Bilirubin 0.3 - 1.2 mg/dL 5.63) - -  Alkaline Phos 38 - 126 U/L 57 - -  AST 15 - 41 U/L 31 - -  ALT 0 - 44 U/L 14 - -     Imaging studies: No new pertinent imaging studies   Assessment/Plan: (ICD-10's: K80.20) 84 y.o. female with persistent upper abdominal discomfort and difficulity with PO intake of uncertain etiology. There may be some component of biliary colic -vs- symptomatic cholelithiasis however there is no evidence of cholecystitis or choledocholithiasis. She is being followed by GI and has history of gastritis and benign esophageal stenosis, and there is a plan for EGD today. If this work up is negative, we will consider laparoscopic cholecystectomy    - Complete GI work up as mentioned above, appreciate their input   - If above negative, we will consider choelcystectomy              - further management per primary service; we  will follow    All of the above findings and recommendations were discussed with the patient, and the medical team, and all of patient's questions were answered to her expressed satisfaction.   -- Lynden Oxford, PA-C [facility] 06/06/2020, 7:36 AM [phone] M-F: 7am - 4pm

## 2020-06-07 ENCOUNTER — Inpatient Hospital Stay: Payer: Medicare HMO | Admitting: Anesthesiology

## 2020-06-07 ENCOUNTER — Encounter
Admission: EM | Disposition: A | Discharge status: Home Health | Payer: Self-pay | Source: Home / Self Care | Attending: Hospitalist

## 2020-06-07 DIAGNOSIS — E875 Hyperkalemia: Secondary | ICD-10-CM

## 2020-06-07 DIAGNOSIS — E860 Dehydration: Secondary | ICD-10-CM

## 2020-06-07 DIAGNOSIS — E878 Other disorders of electrolyte and fluid balance, not elsewhere classified: Secondary | ICD-10-CM

## 2020-06-07 HISTORY — PX: ESOPHAGOGASTRODUODENOSCOPY: SHX5428

## 2020-06-07 LAB — CBC
HCT: 32.8 % — ABNORMAL LOW (ref 36.0–46.0)
Hemoglobin: 10.9 g/dL — ABNORMAL LOW (ref 12.0–15.0)
MCH: 29.9 pg (ref 26.0–34.0)
MCHC: 33.2 g/dL (ref 30.0–36.0)
MCV: 90.1 fL (ref 80.0–100.0)
Platelets: 203 10*3/uL (ref 150–400)
RBC: 3.64 MIL/uL — ABNORMAL LOW (ref 3.87–5.11)
RDW: 16 % — ABNORMAL HIGH (ref 11.5–15.5)
WBC: 8.8 10*3/uL (ref 4.0–10.5)
nRBC: 0 % (ref 0.0–0.2)

## 2020-06-07 LAB — GLUCOSE, CAPILLARY
Glucose-Capillary: 87 mg/dL (ref 70–99)
Glucose-Capillary: 95 mg/dL (ref 70–99)

## 2020-06-07 LAB — BASIC METABOLIC PANEL
Anion gap: 9 (ref 5–15)
BUN: 11 mg/dL (ref 8–23)
CO2: 22 mmol/L (ref 22–32)
Calcium: 8.2 mg/dL — ABNORMAL LOW (ref 8.9–10.3)
Chloride: 102 mmol/L (ref 98–111)
Creatinine, Ser: 0.71 mg/dL (ref 0.44–1.00)
GFR calc Af Amer: >60 mL/min (ref >60)
GFR calc non Af Amer: >60 mL/min (ref >60)
Glucose, Bld: 110 mg/dL — ABNORMAL HIGH (ref 70–99)
Potassium: 5.7 mmol/L — ABNORMAL HIGH (ref 3.5–5.1)
Sodium: 133 mmol/L — ABNORMAL LOW (ref 135–145)

## 2020-06-07 LAB — MAGNESIUM: Magnesium: 2.1 mg/dL (ref 1.7–2.4)

## 2020-06-07 LAB — POTASSIUM: Potassium: 5.3 mmol/L — ABNORMAL HIGH (ref 3.5–5.1)

## 2020-06-07 LAB — PHOSPHORUS: Phosphorus: 3.1 mg/dL (ref 2.5–4.6)

## 2020-06-07 SURGERY — EGD (ESOPHAGOGASTRODUODENOSCOPY)
Anesthesia: General

## 2020-06-07 MED ORDER — PROPOFOL 500 MG/50ML IV EMUL
INTRAVENOUS | Status: DC | PRN
  Administered 2020-06-07: 150 ug/kg/min via INTRAVENOUS
  Administered 2020-06-07: 50 mg via INTRAVENOUS

## 2020-06-07 MED ORDER — LIDOCAINE HCL (CARDIAC) PF 100 MG/5ML IV SOSY
PREFILLED_SYRINGE | INTRAVENOUS | Status: DC | PRN
  Administered 2020-06-07: 100 mg via INTRAVENOUS

## 2020-06-07 MED ORDER — LIDOCAINE HCL (PF) 2 % IJ SOLN
INTRAMUSCULAR | Status: AC
  Filled 2020-06-07: qty 5

## 2020-06-07 MED ORDER — PROPOFOL 10 MG/ML IV BOLUS
INTRAVENOUS | Status: AC
  Filled 2020-06-07: qty 40

## 2020-06-07 MED ORDER — SODIUM CHLORIDE 0.9 % IV SOLN: INTRAVENOUS | Status: DC

## 2020-06-07 MED ORDER — DEXTROSE-NACL 5-0.9 % IV SOLN: INTRAVENOUS | Status: DC

## 2020-06-07 MED ORDER — SODIUM ZIRCONIUM CYCLOSILICATE 5 G PO PACK
5.0000 g | PACK | Freq: Every day | ORAL | Status: DC
  Filled 2020-06-07 (×3): qty 1

## 2020-06-07 NOTE — Anesthesia Preprocedure Evaluation (Addendum)
Anesthesia Evaluation  Patient identified by MRN, date of birth, ID band Patient awake    Reviewed: Allergy & Precautions, H&P , NPO status , Patient's Chart, lab work & pertinent test results  History of Anesthesia Complications Negative for: history of anesthetic complications  Airway Mallampati: II  TM Distance: >3 FB     Dental  (+) Upper Dentures, Lower Dentures   Pulmonary neg COPD, former smoker,    breath sounds clear to auscultation       Cardiovascular hypertension, (-) angina+ dysrhythmias Atrial Fibrillation  Rhythm:irregular Rate:Normal     Neuro/Psych negative neurological ROS  negative psych ROS   GI/Hepatic Neg liver ROS, GERD  ,  Endo/Other  negative endocrine ROS  Renal/GU Renal disease (CKD)  negative genitourinary   Musculoskeletal   Abdominal   Peds  Hematology negative hematology ROS (+)   Anesthesia Other Findings No vomiting since yesterday.  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 No date: Hypertension No date: Intermittent atrial fibrillation (HCC) No date: Scoliosis No date: Vertigo  Past Surgical History: 12/28/2018: CATARACT EXTRACTION W/PHACO; Left     Comment:  Procedure: CATARACT EXTRACTION PHACO AND INTRAOCULAR               LENS PLACEMENT (IOC) LEFT;  Surgeon: Elliot Cousin, MD;                Location: [facility] ORS;  Service: Ophthalmology;  Laterality:              Left;  [place]   01:35 CDE 15.57 Fluid pack lot # [id number] 12/27/2019: ESOPHAGOGASTRODUODENOSCOPY (EGD) WITH PROPOFOL; N/A     Comment:  Procedure: ESOPHAGOGASTRODUODENOSCOPY (EGD) WITH               PROPOFOL;  Surgeon: Wyline Mood, MD;  Location: [facility]               ENDOSCOPY;  Service: Gastroenterology;  Laterality: N/A; No  date: TUBAL LIGATION  BMI    Body Mass Index: 21.35 kg/m      Reproductive/Obstetrics negative OB ROS                           Anesthesia Physical Anesthesia Plan  ASA: III  Anesthesia Plan: General   Post-op Pain Management:    Induction:   PONV Risk Score and Plan: Propofol infusion and TIVA  Airway Management Planned:   Additional Equipment:   Intra-op Plan:   Post-operative Plan:   Informed Consent: I have reviewed the patients History and Physical, chart, labs and discussed the procedure including the risks, benefits and alternatives for the proposed anesthesia with the patient or authorized representative who has indicated his/her understanding and acceptance.     Dental Advisory Given  Plan Discussed with: Anesthesiologist, CRNA and Surgeon  Anesthesia Plan Comments:         Anesthesia Quick Evaluation

## 2020-06-07 NOTE — Progress Notes (Signed)
[facility] SURGICAL PROGRESS NOTE (cpt [phone])  Hospital Day(s): 5.   Interval History: Patient seen and examined, no acute events or new complaints overnight. Denies abdominal pain but says she threw up her pills after her EGD; did not have EGD yesterday because she ate.  Underwent upper endoscopy this morning with Dr. Norma Fredrickson.  Findings copied here:  "- Esophageal mucosal variant. Biopsied. - Benign-appearing esophageal stenosis. Dilated. - 3 cm hiatal hernia. - Gastritis. - Normal examined duodenum. - The examination was otherwise normal.  - Return patient to hospital [facility] for ongoing care. - Await pathology results. - Full liquid diet."  Review of Systems:  Constitutional: denies fever, chills, + decreased appetite  HEENT: denies cough or congestion  Respiratory: denies any shortness of breath  Cardiovascular: denies chest pain or palpitations  Gastrointestinal: + abdominal pain, denied N/V, or diarrhea/and bowel function as per interval history Genitourinary: denies burning with urination or urinary frequency   Vital signs in last 24 hours: [min-max] current  Temp:  [97.7 F (36.5 C)-98.6 F (37 C)] 98 F (36.7 C) (07/31 1235) Pulse Rate:  [85-104] 99 (07/31 1235) Resp:  [14-22] 22 (07/31 1235) BP: (107-155)/(55-73) 111/55 (07/31 1235) SpO2:  [96 %-100 %] 100 % (07/31 1235)     Height: 5\' 3"  (160 cm) Weight: 54.7 kg BMI (Calculated): 21.35   Intake/Output last 2 shifts:  07/30 0701 - 07/31 0700 In: 1.3 [I.V.:1.3] Out: 950 [Urine:950]   Physical Exam:  Constitutional: alert, cooperative and no distress  HENT: normocephalic without obvious abnormality  Eyes: PERRL, EOM's grossly intact and symmetric  Respiratory: breathing non-labored at rest  Cardiovascular: regular rate and sinus rhythm  Gastrointestinal: Soft, non-tender and non-distended, no rebound/guarding, negative Murphy's Sign Musculoskeletal: No edema or wounds, motor and sensation grossly  intact, NT    Labs:  CBC Latest Ref Rng & Units 06/07/2020 06/06/2020 06/05/2020  WBC 4.0 - 10.5 K/uL 8.8 5.2 6.6  Hemoglobin 12.0 - 15.0 g/dL 10.9(L) 11.0(L) 11.0(L)  Hematocrit 36 - 46 % 32.8(L) 32.9(L) 33.5(L)  Platelets 150 - 400 K/uL 203 202 214   CMP Latest Ref Rng & Units 06/07/2020 06/06/2020 06/05/2020  Glucose 70 - 99 mg/dL 06/07/2020) 82 128(N)  BUN 8 - 23 mg/dL 11 6(L) 9  Creatinine 867(E - 1.00 mg/dL 7.20 9.47 0.96  Sodium 135 - 145 mmol/L 133(L) 136 135  Potassium 3.5 - 5.1 mmol/L 5.7(H) 5.3(H) 3.5  Chloride 98 - 111 mmol/L 102 101 93(L)  CO2 22 - 32 mmol/L 22 27 33(H)  Calcium 8.9 - 10.3 mg/dL 8.2(L) 8.0(L) 8.1(L)  Total Protein 6.5 - 8.1 g/dL - - 5.4(L)  Total Bilirubin 0.3 - 1.2 mg/dL - - 1.5(H)  Alkaline Phos 38 - 126 U/L - - 57  AST 15 - 41 U/L - - 31  ALT 0 - 44 U/L - - 14     Imaging studies: No new pertinent imaging studies   Assessment/Plan: (ICD-10's: K80.20) 84 y.o. female with persistent upper abdominal discomfort and difficulity with PO intake of uncertain etiology. There may be some component of biliary colic -vs- symptomatic cholelithiasis however there is no evidence of cholecystitis or choledocholithiasis. She is being followed by GI and has history of gastritis and benign esophageal stenosis.  EGD performed this morning with results copied above.   - Due to findings on EGD, it is unlikely that her cholelithiasis is a contributing factor to her symptoms.  At this time, cholecystectomy can be deferred.             -  Further management per primary service; we will follow

## 2020-06-07 NOTE — Anesthesia Postprocedure Evaluation (Signed)
Anesthesia Post Note  Patient: Kristen Richard  Procedure(s) Performed: ESOPHAGOGASTRODUODENOSCOPY (EGD) (N/A )  Patient location during evaluation: PACU Anesthesia Type: General Level of consciousness: awake and alert Pain management: pain level controlled Vital Signs Assessment: post-procedure vital signs reviewed and stable Respiratory status: spontaneous breathing, nonlabored ventilation and respiratory function stable Cardiovascular status: blood pressure returned to baseline and stable Postop Assessment: no apparent nausea or vomiting Anesthetic complications: no   No complications documented.   Last Vitals:  Vitals:   06/07/20 1235 06/07/20 1631  BP: (!) 111/55 (!) 121/60  Pulse: 99 92  Resp: 22 18  Temp: 36.7 C 36.7 C  SpO2: 100% 100%    Last Pain:  Vitals:   06/07/20 1235  TempSrc: Oral  PainSc:                  Karleen Hampshire

## 2020-06-07 NOTE — Progress Notes (Signed)
PROGRESS NOTE    Kristen Richard  XBJ:[id number] DOB: 1936-02-08 DOA: 06/02/2020 PCP: [facility]   Brief Narrative:  Kristen Richard a 84 y.o.femalewith medical history significant ofafibnot on anticoagulation, gastritis who presented with N/V/D and abdominal pain. Some concern of cholelithiasis without any sign of cholecystitis. Underwent EGD today with some concern of nonspecific/eosinophilic esophagitis, biopsies were taken and a mild stenosis of gastroesophageal sphincter which was dilated. Surgery wants to deferred cholecystectomy at this time due to unclear etiology.  Subjective: Patient was seen after EGD this morning.  No new complaints.  Denies any nausea or vomiting.  Later informed by nursing staff that she vomited after taking her morning meds.  Assessment & Plan:   Active Problems:   Disorder of electrolytes  # N/V diarrhea and abdominal pain # Cholelithiasis # Hx of benignesophageal stenosis. Patient underwent EGD this morning with GI which shows mild nonspecific esophagitis/eosinophilic esophagitis.  Mild gastroesophageal sphincter stenosis was dilated.  Multiple biopsies were taking-pending results. Surgery does not think that cholecystectomy is needed at this time. Patient did vomited this morning after EGD. -Continue to monitor. -Continue IV fluid. -Clear liquid diet-advance very slowly.  Electrolyte abnormalities.  Initially presented with hypokalemia, hypomagnesemia and hypophosphatemia, most likely secondary to GI losses and poor p.o. intake.  Electrolyte normalized with repletion. -Continue to monitor-replete as needed.  Hyperkalemia.  Initially given potassium replacement due to hypokalemia, now potassium at 5.7. -Add Lokelma. -Continue to monitor.  Hypoglycemia.  Resolved. Most likely secondary to poor p.o. intake. -Continue normal saline with D5. -Continue to monitor.  Elevated troponin.  Patient had mildly elevated troponin on  admission, most likely secondary to demand.  No chest pain. ACS ruled out.  Persistent A. fib.  Not on any anticoagulation.  Currently rate controlled. Last seen by cardiology on 05/26/20.  -CHA2DS2-VASc score of 4 (age, htn, gender).Previously on beta-blocker/Lopressor which caused sinus pauses hence beta-blocker was stopped. Due to frequent falls,not onanticoagulation.  #History of hyperlipidemia -continue statin.  #History of hypertension -blood pressure controlled off blood pressure meds -Continue to monitor.  #Lateral lower extremity edema -edema improved since presentation. -Keep holding home Lasix for now since pt is needing IVF  Objective: Vitals:   06/07/20 0851 06/07/20 0901 06/07/20 0912 06/07/20 1235  BP: (!) 107/60 (!) 112/59 109/69 (!) 111/55  Pulse: 85 85 89 99  Resp: 16 14 18 22   Temp:  98.4 F (36.9 C)  98 F (36.7 C)  TempSrc:    Oral  SpO2: 97% 96% 98% 100%  Weight:      Height:        Intake/Output Summary (Last 24 hours) at 06/07/2020 1343 Last data filed at 06/07/2020 06/09/2020 Gross per 24 hour  Intake 226.29 ml  Output 950 ml  Net -723.71 ml   Filed Weights   06/02/20 1326 06/03/20 2040  Weight: 52.6 kg 54.7 kg    Examination:  General exam: Frail elderly lady, appears calm and comfortable  Respiratory system: Clear to auscultation. Respiratory effort normal. Cardiovascular system: Irregularly irregular rhythm with normal rate. Gastrointestinal system: Soft, nontender, nondistended, bowel sounds positive. Central nervous system: Alert and oriented. No focal neurological deficits. Extremities: No edema, no cyanosis, pulses intact and symmetrical. Psychiatry: Judgement and insight appear impaired.  DVT prophylaxis: Lovenox Code Status: Full Family Communication: Daughter was updated at phone. Disposition Plan:  Status is: Inpatient  Remains inpatient appropriate because:Inpatient level of care appropriate due to severity of  illness   Dispo: The patient is from:  Home              Anticipated d/c is to: Home-patient lives with granddaughter.              Anticipated d/c date is: 1 day              Patient currently is not medically stable to d/c.  Should be able to go home in 1 to 2 days if able to tolerate p.o. intake.  Consultants:   GI  Surgery  Procedures:  EGD   Antimicrobials:   Data Reviewed: I have personally reviewed following labs and imaging studies  CBC: Recent Labs  Lab 06/03/20 0458 06/04/20 0505 06/05/20 0513 06/06/20 1003 06/07/20 0656  WBC 5.3 6.3 6.6 5.2 8.8  HGB 11.3* 11.2* 11.0* 11.0* 10.9*  HCT 33.3* 33.9* 33.5* 32.9* 32.8*  MCV 88.1 89.2 89.3 89.4 90.1  PLT 201 235 214 202 203   Basic Metabolic Panel: Recent Labs  Lab 06/02/20 1330 06/02/20 1700 06/03/20 0458 06/04/20 0505 06/05/20 0513 06/06/20 1003 06/07/20 0656  NA   < >  --  136 136 135 136 133*  K   < >  --  3.1* 2.7* 3.5 5.3* 5.7*  CL   < >  --  87* 87* 93* 101 102  CO2   < >  --  38* 37* 33* 27 22  GLUCOSE   < >  --  111* 82 106* 82 110*  BUN   < >  --  16 12 9  6* 11  CREATININE   < >  --  0.84 0.77 0.76 0.63 0.71  CALCIUM   < >  --  7.7* 8.1* 8.1* 8.0* 8.2*  MG   < >  --  2.5* 2.2 1.8 1.4* 2.1  PHOS  --  2.1*  --  2.4* 1.7* 3.5 3.1   < > = values in this interval not displayed.   GFR: Estimated Creatinine Clearance: 45.6 mL/min (by C-G formula based on SCr of 0.71 mg/dL). Liver Function Tests: Recent Labs  Lab 06/02/20 1330 06/05/20 0513  AST 43* 31  ALT 17 14  ALKPHOS 68 57  BILITOT 2.6* 1.5*  PROT 6.9 5.4*  ALBUMIN 3.1* 2.4*   Recent Labs  Lab 06/02/20 1330  LIPASE 26   No results for input(s): AMMONIA in the last 168 hours. Coagulation Profile: No results for input(s): INR, PROTIME in the last 168 hours. Cardiac Enzymes: No results for input(s): CKTOTAL, CKMB, CKMBINDEX, TROPONINI in the last 168 hours. BNP (last 3 results) No results for input(s): PROBNP in the last 8760  hours. HbA1C: No results for input(s): HGBA1C in the last 72 hours. CBG: Recent Labs  Lab 06/05/20 1313 06/06/20 0004 06/06/20 0616 06/06/20 1820 06/07/20 0448  GLUCAP 89 79 81 91 87   Lipid Profile: No results for input(s): CHOL, HDL, LDLCALC, TRIG, CHOLHDL, LDLDIRECT in the last 72 hours. Thyroid Function Tests: No results for input(s): TSH, T4TOTAL, FREET4, T3FREE, THYROIDAB in the last 72 hours. Anemia Panel: No results for input(s): VITAMINB12, FOLATE, FERRITIN, TIBC, IRON, RETICCTPCT in the last 72 hours. Sepsis Labs: No results for input(s): PROCALCITON, LATICACIDVEN in the last 168 hours.  Recent Results (from the past 240 hour(s))  SARS Coronavirus 2 by RT PCR (hospital order, performed in [facility] hospital lab) Nasopharyngeal Nasopharyngeal Swab     Status: None   Collection Time: 06/02/20  2:34 PM   Specimen: Nasopharyngeal Swab  Result Value Ref Range Status  SARS Coronavirus 2 NEGATIVE NEGATIVE Final    Comment: (NOTE) SARS-CoV-2 target nucleic acids are NOT DETECTED.  The SARS-CoV-2 RNA is generally detectable in upper and lower respiratory specimens during the acute phase of infection. The lowest concentration of SARS-CoV-2 viral copies this assay can detect is 250 copies / mL. A negative result does not preclude SARS-CoV-2 infection and should not be used as the sole basis for treatment or other patient management decisions.  A negative result may occur with improper specimen collection / handling, submission of specimen other than nasopharyngeal swab, presence of viral mutation(s) within the areas targeted by this assay, and inadequate number of viral copies (<250 copies / mL). A negative result must be combined with clinical observations, patient history, and epidemiological information.  Fact Sheet for Patients:   BoilerBrush.com.cy  Fact Sheet for Healthcare Providers: https://pope.com/  This test  is not yet approved or  cleared by the [place] FDA and has been authorized for detection and/or diagnosis of SARS-CoV-2 by FDA under an Emergency Use Authorization (EUA).  This EUA will remain in effect (meaning this test can be used) for the duration of the COVID-19 declaration under Section 564(b)(1) of the Act, 21 U.S.C. section 360bbb-3(b)(1), unless the authorization is terminated or revoked sooner.  Performed at [facility], [address], [place], [place] [address]      Radiology Studies: No results found.  Scheduled Meds: . atorvastatin  40 mg Oral Daily  . enoxaparin (LOVENOX) injection  40 mg Subcutaneous Q24H  . pantoprazole  40 mg Oral Daily   Continuous Infusions: . dextrose 5 % and 0.9% NaCl 50 mL/hr at 06/07/20 0817     LOS: 5 days   Time spent: 45 minutes.  Arnetha Courser, MD [facility]  If 7PM-7AM, please contact night-coverage [email or web address]  06/07/2020, 1:43 PM   This record has been created using Conservation officer, historic buildings. Errors have been sought and corrected,but may not always be located. Such creation errors do not reflect on the standard of care.

## 2020-06-07 NOTE — Op Note (Signed)
[facility] Patient Name: Lenzi Burgoon Procedure Date: 06/07/2020 7:25 AM MRN: [id number] Account #: [id number] Date of Birth: 18-Jul-1936 Admit Type: Inpatient Age: 84 Room: [facility] ENDO ROOM 4 Gender: Female Note Status: Finalized Procedure:             Upper GI endoscopy Indications:           Epigastric abdominal pain, Abdominal pain in the right                         upper quadrant, Nausea with vomiting Providers:             Boykin Nearing. Norma Fredrickson MD, MD Referring MD:          Debbe Odea Medicines:             Propofol per Anesthesia Complications:         No immediate complications. Estimated blood loss:                         Minimal. Procedure:             Pre-Anesthesia Assessment:                        - The risks and benefits of the procedure and the                         sedation options and risks were discussed with the                         patient. All questions were answered and informed                         consent was obtained.                        - Patient identification and proposed procedure were                         verified prior to the procedure by the nurse. The                         procedure was verified in the pre-procedure area in                         the procedure room.                        - ASA Grade Assessment: III - A patient with severe                         systemic disease.                        - After reviewing the risks and benefits, the patient                         was deemed in satisfactory condition to undergo the                         procedure.  After obtaining informed consent, the endoscope was                         passed under direct vision. Throughout the procedure,                         the patient's blood pressure, pulse, and oxygen                         saturations were monitored continuously. The Endoscope                         was  introduced through the mouth, and advanced to the                         third part of duodenum. The upper GI endoscopy was                         accomplished without difficulty. The patient tolerated                         the procedure well. Findings:      Scattered mild mucosal variance characterized by feline appearance was       found in the entire esophagus. Biopsies were obtained from the proximal       and distal esophagus with cold forceps for histology of suspected       eosinophilic esophagitis.      One benign-appearing, intrinsic moderate stenosis was found at the       gastroesophageal junction. This stenosis measured 1.4 cm (inner       diameter) x less than one cm (in length). The stenosis was traversed. A       TTS dilator was passed through the scope. Dilation with a 15-16.5-18 mm       balloon dilator was performed to 18 mm. The dilation site was examined       following endoscope reinsertion and showed moderate improvement in       luminal narrowing. Estimated blood loss was minimal.      A 3 cm hiatal hernia was present.      Patchy mild inflammation characterized by erosions and erythema was       found in the gastric antrum.      The examined duodenum was normal.      The exam was otherwise without abnormality. Impression:            - Esophageal mucosal variant. Biopsied.                        - Benign-appearing esophageal stenosis. Dilated.                        - 3 cm hiatal hernia.                        - Gastritis.                        - Normal examined duodenum.                        - The examination was otherwise  normal. Recommendation:        - Return patient to hospital [facility] for ongoing care.                        - Await pathology results.                        - Full liquid diet. Procedure Code(s):     --- Professional ---                        [phone], Esophagogastroduodenoscopy, flexible,                         transoral; with  transendoscopic balloon dilation of                         esophagus (less than 30 mm diameter)                        43239, 59, Esophagogastroduodenoscopy, flexible,                         transoral; with biopsy, single or multiple Diagnosis Code(s):     --- Professional ---                        R10.11, Right upper quadrant pain                        R10.13, Epigastric pain                        K29.70, Gastritis, unspecified, without bleeding                        K44.9, Diaphragmatic hernia without obstruction or                         gangrene                        K22.2, Esophageal obstruction                        K22.8, Other specified diseases of esophagus                        R11.2, Nausea with vomiting, unspecified CPT copyright 2019 American Medical Association. All rights reserved. The codes documented in this report are preliminary and upon coder review may  be revised to meet current compliance requirements. Stanton Kidney MD, MD 06/07/2020 8:37:23 AM This report has been signed electronically. Number of Addenda: 0 Note Initiated On: 06/07/2020 7:25 AM Estimated Blood Loss:  Estimated blood loss was minimal.      [facility]

## 2020-06-07 NOTE — Interval H&P Note (Signed)
History and Physical Interval Note:  06/07/2020 8:18 AM  Kristen Richard  has presented today for surgery, with the diagnosis of Epigastric/RUQ abd pain, nausea and vomiting.  The various methods of treatment have been discussed with the patient and family. After consideration of risks, benefits and other options for treatment, the patient has consented to  Procedure(s): ESOPHAGOGASTRODUODENOSCOPY (EGD) (N/A) as a surgical intervention.  The patient's history has been reviewed, patient examined, no change in status, stable for surgery.  I have reviewed the patient's chart and labs.  Questions were answered to the patient's satisfaction.     [place], [place]

## 2020-06-07 NOTE — Transfer of Care (Signed)
Immediate Anesthesia Transfer of Care Note  Patient: Kristen Richard  Procedure(s) Performed: ESOPHAGOGASTRODUODENOSCOPY (EGD) (N/A )  Patient Location: PACU  Anesthesia Type:MAC  Level of Consciousness: awake and alert   Airway & Oxygen Therapy: Patient Spontanous Breathing and Patient connected to nasal cannula oxygen  Post-op Assessment: Report given to RN and Post -op Vital signs reviewed and stable  Post vital signs: Reviewed and stable  Last Vitals:  Vitals Value Taken Time  BP 109/64   Temp    Pulse 90   Resp 17   SpO2 97     Last Pain:  Vitals:   06/07/20 0749  TempSrc: Temporal  PainSc: 0-No pain         Complications: No complications documented.

## 2020-06-08 LAB — VITAMIN B12: Vitamin B-12: 229 pg/mL (ref 180–914)

## 2020-06-08 LAB — FERRITIN: Ferritin: 305 ng/mL (ref 11–307)

## 2020-06-08 LAB — RETICULOCYTES
Immature Retic Fract: 9.9 % (ref 2.3–15.9)
RBC.: 3.25 MIL/uL — ABNORMAL LOW (ref 3.87–5.11)
Retic Count, Absolute: 33.2 10*3/uL (ref 19.0–186.0)
Retic Ct Pct: 1 % (ref 0.4–3.1)

## 2020-06-08 LAB — BASIC METABOLIC PANEL
Anion gap: 5 (ref 5–15)
BUN: 12 mg/dL (ref 8–23)
CO2: 27 mmol/L (ref 22–32)
Calcium: 8.3 mg/dL — ABNORMAL LOW (ref 8.9–10.3)
Chloride: 104 mmol/L (ref 98–111)
Creatinine, Ser: 0.63 mg/dL (ref 0.44–1.00)
GFR calc Af Amer: >60 mL/min (ref >60)
GFR calc non Af Amer: >60 mL/min (ref >60)
Glucose, Bld: 88 mg/dL (ref 70–99)
Potassium: 4.7 mmol/L (ref 3.5–5.1)
Sodium: 136 mmol/L (ref 135–145)

## 2020-06-08 LAB — CBC
HCT: 32 % — ABNORMAL LOW (ref 36.0–46.0)
Hemoglobin: 10.1 g/dL — ABNORMAL LOW (ref 12.0–15.0)
MCH: 29.4 pg (ref 26.0–34.0)
MCHC: 31.6 g/dL (ref 30.0–36.0)
MCV: 93.3 fL (ref 80.0–100.0)
Platelets: 174 10*3/uL (ref 150–400)
RBC: 3.43 MIL/uL — ABNORMAL LOW (ref 3.87–5.11)
RDW: 15.9 % — ABNORMAL HIGH (ref 11.5–15.5)
WBC: 7.5 10*3/uL (ref 4.0–10.5)
nRBC: 0 % (ref 0.0–0.2)

## 2020-06-08 LAB — IRON AND TIBC
Iron: 72 ug/dL (ref 28–170)
Saturation Ratios: 61 % — ABNORMAL HIGH (ref 10.4–31.8)
TIBC: 119 ug/dL — ABNORMAL LOW (ref 250–450)
UIBC: 47 ug/dL

## 2020-06-08 LAB — GLUCOSE, CAPILLARY
Glucose-Capillary: 80 mg/dL (ref 70–99)
Glucose-Capillary: 80 mg/dL (ref 70–99)
Glucose-Capillary: 94 mg/dL (ref 70–99)
Glucose-Capillary: 95 mg/dL (ref 70–99)

## 2020-06-08 LAB — MAGNESIUM: Magnesium: 1.8 mg/dL (ref 1.7–2.4)

## 2020-06-08 LAB — FOLATE: Folate: 1.3 ng/mL — ABNORMAL LOW (ref >5.9)

## 2020-06-08 LAB — PHOSPHORUS: Phosphorus: 2.5 mg/dL (ref 2.5–4.6)

## 2020-06-08 NOTE — Progress Notes (Signed)
GI Inpatient Follow-up Note  Subjective:  Patient seen in follow-up for nausea and vomiting, RUQ/epigastric abd pain and is s/p EGD yesterday by Dr. Norma Fredrickson which was significant for scattered mild mucosal variance characterized by feline appearance with biopsies pending to rule out eosinophilic esophagitis, benign-appearing esophageal stricture s/p dilatation with balloon dilator to 18 mm, 3 cm hiatal hernia, gastritis, and normal examined duodenum. Patient did regurgitate her pills after the endoscopy yesterday. No acute events overnight. No complaints of nausea, vomiting, or abdominal pain currently. Patient sitting up in bed and drinking coffee this morning. Surgery has seen patient and no plans for cholecystectomy at this time.   Scheduled Inpatient Medications:  . atorvastatin  40 mg Oral Daily  . enoxaparin (LOVENOX) injection  40 mg Subcutaneous Q24H  . pantoprazole  40 mg Oral Daily    Continuous Inpatient Infusions:   . dextrose 5 % and 0.9% NaCl 75 mL/hr at 06/08/20 1046    PRN Inpatient Medications:  acetaminophen, alum & mag hydroxide-simeth, calcium carbonate, docusate sodium, guaiFENesin-dextromethorphan, ondansetron (ZOFRAN) IV, ondansetron, polyethylene glycol  Review of Systems: Constitutional: Weight is stable.  Eyes: No changes in vision. ENT: No oral lesions, sore throat.  GI: see HPI.  Heme/Lymph: No easy bruising.  CV: No chest pain.  GU: No hematuria.  Integumentary: No rashes.  Neuro: No headaches.  Psych: No depression/anxiety.  Endocrine: No heat/cold intolerance.  Allergic/Immunologic: No urticaria.  Resp: No cough, SOB.  Musculoskeletal: No joint swelling.    Physical Examination: BP (!) 138/60 (BP Location: Left Wrist)   Pulse 87   Temp 97.7 F (36.5 C) (Oral)   Resp 18   Ht 5\' 3"  (1.6 m)   Wt 54.7 kg   SpO2 99%   BMI 21.35 kg/m  Gen: NAD, alert and oriented x 4 HEENT: PEERLA, EOMI, Neck: supple, no JVD or thyromegaly Chest: CTA  bilaterally, no wheezes, crackles, or other adventitious sounds CV: RRR, no m/g/c/r Abd: soft, NT, ND, +BS in all four quadrants; no HSM, guarding, ridigity, or rebound tenderness Ext: no edema, well perfused with 2+ pulses, Skin: no rash or lesions noted Lymph: no LAD  Data: Lab Results  Component Value Date   WBC 7.5 06/08/2020   HGB 10.1 (L) 06/08/2020   HCT 32.0 (L) 06/08/2020   MCV 93.3 06/08/2020   PLT 174 06/08/2020   Recent Labs  Lab 06/06/20 1003 06/07/20 0656 06/08/20 0521  HGB 11.0* 10.9* 10.1*   Lab Results  Component Value Date   NA 136 06/08/2020   K 4.7 06/08/2020   CL 104 06/08/2020   CO2 27 06/08/2020   BUN 12 06/08/2020   CREATININE 0.63 06/08/2020   Lab Results  Component Value Date   ALT 14 06/05/2020   AST 31 06/05/2020   ALKPHOS 57 06/05/2020   BILITOT 1.5 (H) 06/05/2020   No results for input(s): APTT, INR, PTT in the last 168 hours. Assessment/Plan:  84 y/o Caucasian female seen in follow-up for non-intractable nausea and vomiting, RUQ/epigastric abd pain s/p EGD yesterday with Dr. 94  1. Esophageal mucosal changes consistent with eosinophilic esophagitis 2. Esophageal stricture s/p balloon dilatation 3. Gastritis 4. Cholelithiasis without evidence of cholecystitis - LFTs normal, surgery following and no indication for cholecystectomy 5. Metabolic derangement - improving with repletion and IVF   - Esophageal biopsies pending to rule out EoE - Reviewed EGD procedure reports with patient and daughter - Patient showing clinical signs of improvement with no complaints of nausea, vomiting, or abdominal pain  at present time - Follow up on EGD pathology when available - Continue soft diet as tolerated - Continue symptomatic treatment - Continue Protonix 40 mg PO daily   Please call with questions or concerns.   Jacob Moores, PA-C [facility] [phone] (Cell)

## 2020-06-08 NOTE — Progress Notes (Signed)
PROGRESS NOTE    Kristen Richard  DGU:[id number] DOB: 1936-10-10 DOA: 06/02/2020 PCP: [facility]   Brief Narrative:  Kristen Richard a 84 y.o.femalewith medical history significant ofafibnot on anticoagulation, gastritis who presented with N/V/D and abdominal pain. Some concern of cholelithiasis without any sign of cholecystitis. Underwent EGD on 06/07/2020 with some concern of nonspecific/eosinophilic esophagitis, biopsies were taken and a mild stenosis of gastroesophageal sphincter which was dilated. Surgery wants to deferred cholecystectomy at this time due to unclear etiology.  Subjective: Patient denies any nausea, vomiting or abdominal pain this morning.  She has no appetite either.  Assessment & Plan:   Active Problems:   Disorder of electrolytes   Hyperkalemia  # N/V diarrhea and abdominal pain # Cholelithiasis # Hx of benignesophageal stenosis. Patient underwent EGD yesterday with GI which shows mild nonspecific esophagitis/eosinophilic esophagitis.  Mild gastroesophageal sphincter stenosis was dilated.  Multiple biopsies were taking-pending results. Surgery does not think that cholecystectomy is needed at this time, they signed off. Nausea, vomiting or abdominal pain this morning.  Poor p.o. intake secondary to anorexia. -Continue to monitor. -Continue IV fluid. -Soft diet and encourage p.o. intake.  If she continues to refuse food then palliative care consult will be ordered.  Electrolyte abnormalities.  Initially presented with hypokalemia, hypomagnesemia and hypophosphatemia, most likely secondary to GI losses and poor p.o. intake.  Electrolyte normalized with repletion. -Continue to monitor-replete as needed.  Hyperkalemia.  Resolved -Continue to monitor.  Hypoglycemia.  Resolved. Most likely secondary to poor p.o. intake. -Continue normal saline with D5. -Continue to monitor.  Elevated troponin.  Patient had mildly elevated troponin on  admission, most likely secondary to demand.  No chest pain. ACS ruled out.  Persistent A. fib.  Not on any anticoagulation.  Currently rate controlled. Last seen by cardiology on 05/26/20.  -CHA2DS2-VASc score of 4 (age, htn, gender).Previously on beta-blocker/Lopressor which caused sinus pauses hence beta-blocker was stopped. Due to frequent falls,not onanticoagulation.  #History of hyperlipidemia -continue statin.  #History of hypertension -blood pressure controlled off blood pressure meds -Continue to monitor.  #Lateral lower extremity edema -edema improved since presentation. -Keep holding home Lasix for now since pt is needing IVF  Objective: Vitals:   06/08/20 0029 06/08/20 0434 06/08/20 0823 06/08/20 1153  BP: (!) 131/71 (!) 138/54 (!) 138/60 119/78  Pulse: 85 86 87 86  Resp: 16 15 18 18   Temp: 98 F (36.7 C) 97.8 F (36.6 C) 97.7 F (36.5 C) 97.9 F (36.6 C)  TempSrc: Oral Oral Oral Oral  SpO2: 98% 99% 99% 100%  Weight:      Height:        Intake/Output Summary (Last 24 hours) at 06/08/2020 1442 Last data filed at 06/08/2020 1408 Gross per 24 hour  Intake 180 ml  Output 250 ml  Net -70 ml   Filed Weights   06/02/20 1326 06/03/20 2040  Weight: 52.6 kg 54.7 kg    Examination:  General.  Lethargic appearing, frail elderly lady, in no acute distress. Pulmonary.  Lungs clear bilaterally, normal respiratory effort. CV.  Regular rate and rhythm, no JVD, rub or murmur. Abdomen.  Soft, nontender, nondistended, BS positive. CNS.  Alert and oriented x3.  No focal neurologic deficit. Extremities.  No edema, no cyanosis, pulses intact and symmetrical. Psychiatry.  Judgment and insight appears normal.  DVT prophylaxis: Lovenox Code Status: Full Family Communication: Daughter was updated at phone. Disposition Plan:  Status is: Inpatient  Remains inpatient appropriate because:Inpatient level of care appropriate  due to severity of illness   Dispo: The  patient is from: Home              Anticipated d/c is to: Home-patient lives with granddaughter.              Anticipated d/c date is: 1 day              Patient currently is not medically stable to d/c.  Should be able to go home in 1 to 2 days if able to tolerate p.o. intake.  Consultants:   GI  Surgery-signed off today.  Procedures:  EGD   Antimicrobials:   Data Reviewed: I have personally reviewed following labs and imaging studies  CBC: Recent Labs  Lab 06/04/20 0505 06/05/20 0513 06/06/20 1003 06/07/20 0656 06/08/20 0521  WBC 6.3 6.6 5.2 8.8 7.5  HGB 11.2* 11.0* 11.0* 10.9* 10.1*  HCT 33.9* 33.5* 32.9* 32.8* 32.0*  MCV 89.2 89.3 89.4 90.1 93.3  PLT 235 214 202 203 174   Basic Metabolic Panel: Recent Labs  Lab 06/04/20 0505 06/04/20 0505 06/05/20 0513 06/06/20 1003 06/07/20 0656 06/07/20 1723 06/08/20 0521  NA 136  --  135 136 133*  --  136  K 2.7*   < > 3.5 5.3* 5.7* 5.3* 4.7  CL 87*  --  93* 101 102  --  104  CO2 37*  --  33* 27 22  --  27  GLUCOSE 82  --  106* 82 110*  --  88  BUN 12  --  9 6* 11  --  12  CREATININE 0.77  --  0.76 0.63 0.71  --  0.63  CALCIUM 8.1*  --  8.1* 8.0* 8.2*  --  8.3*  MG 2.2  --  1.8 1.4* 2.1  --  1.8  PHOS 2.4*  --  1.7* 3.5 3.1  --  2.5   < > = values in this interval not displayed.   GFR: Estimated Creatinine Clearance: 45.6 mL/min (by C-G formula based on SCr of 0.63 mg/dL). Liver Function Tests: Recent Labs  Lab 06/02/20 1330 06/05/20 0513  AST 43* 31  ALT 17 14  ALKPHOS 68 57  BILITOT 2.6* 1.5*  PROT 6.9 5.4*  ALBUMIN 3.1* 2.4*   Recent Labs  Lab 06/02/20 1330  LIPASE 26   No results for input(s): AMMONIA in the last 168 hours. Coagulation Profile: No results for input(s): INR, PROTIME in the last 168 hours. Cardiac Enzymes: No results for input(s): CKTOTAL, CKMB, CKMBINDEX, TROPONINI in the last 168 hours. BNP (last 3 results) No results for input(s): PROBNP in the last 8760 hours. HbA1C: No  results for input(s): HGBA1C in the last 72 hours. CBG: Recent Labs  Lab 06/07/20 0448 06/07/20 1630 06/08/20 0040 06/08/20 0434 06/08/20 1149  GLUCAP 87 95 94 80 80   Lipid Profile: No results for input(s): CHOL, HDL, LDLCALC, TRIG, CHOLHDL, LDLDIRECT in the last 72 hours. Thyroid Function Tests: No results for input(s): TSH, T4TOTAL, FREET4, T3FREE, THYROIDAB in the last 72 hours. Anemia Panel: No results for input(s): VITAMINB12, FOLATE, FERRITIN, TIBC, IRON, RETICCTPCT in the last 72 hours. Sepsis Labs: No results for input(s): PROCALCITON, LATICACIDVEN in the last 168 hours.  Recent Results (from the past 240 hour(s))  SARS Coronavirus 2 by RT PCR (hospital order, performed in [facility] hospital lab) Nasopharyngeal Nasopharyngeal Swab     Status: None   Collection Time: 06/02/20  2:34 PM   Specimen: Nasopharyngeal Swab  Result  Value Ref Range Status   SARS Coronavirus 2 NEGATIVE NEGATIVE Final    Comment: (NOTE) SARS-CoV-2 target nucleic acids are NOT DETECTED.  The SARS-CoV-2 RNA is generally detectable in upper and lower respiratory specimens during the acute phase of infection. The lowest concentration of SARS-CoV-2 viral copies this assay can detect is 250 copies / mL. A negative result does not preclude SARS-CoV-2 infection and should not be used as the sole basis for treatment or other patient management decisions.  A negative result may occur with improper specimen collection / handling, submission of specimen other than nasopharyngeal swab, presence of viral mutation(s) within the areas targeted by this assay, and inadequate number of viral copies (<250 copies / mL). A negative result must be combined with clinical observations, patient history, and epidemiological information.  Fact Sheet for Patients:   BoilerBrush.com.cy  Fact Sheet for Healthcare Providers: https://pope.com/  This test is not yet approved  or  cleared by the [place] FDA and has been authorized for detection and/or diagnosis of SARS-CoV-2 by FDA under an Emergency Use Authorization (EUA).  This EUA will remain in effect (meaning this test can be used) for the duration of the COVID-19 declaration under Section 564(b)(1) of the Act, 21 U.S.C. section 360bbb-3(b)(1), unless the authorization is terminated or revoked sooner.  Performed at [facility], [address], [place], [place] [address]      Radiology Studies: No results found.  Scheduled Meds: . atorvastatin  40 mg Oral Daily  . enoxaparin (LOVENOX) injection  40 mg Subcutaneous Q24H  . pantoprazole  40 mg Oral Daily   Continuous Infusions: . dextrose 5 % and 0.9% NaCl 75 mL/hr at 06/08/20 1046     LOS: 6 days   Time spent: 35 minutes.  Arnetha Courser, MD [facility]  If 7PM-7AM, please contact night-coverage [email or web address]  06/08/2020, 2:42 PM   This record has been created using Conservation officer, historic buildings. Errors have been sought and corrected,but may not always be located. Such creation errors do not reflect on the standard of care.

## 2020-06-08 NOTE — Progress Notes (Signed)
[facility] SURGICAL PROGRESS NOTE (cpt [phone])  Hospital Day(s): 6.   Interval History: Patient seen and examined, no acute events or new complaints overnight. Denies abdominal pain and has not experienced any other episodes of emesis or regurgitation.  She is tolerating a full liquid diet, but states that she does not have much appetite.  Review of Systems:  Constitutional: denies fever, chills, + decreased appetite  HEENT: denies cough or congestion  Respiratory: denies any shortness of breath  Cardiovascular: denies chest pain or palpitations  Gastrointestinal: + abdominal pain, denied N/V, or diarrhea/and bowel function as per interval history Genitourinary: denies burning with urination or urinary frequency   Vital signs in last 24 hours: [min-max] current  Temp:  [97.7 F (36.5 C)-98.1 F (36.7 C)] 97.7 F (36.5 C) (08/01 0823) Pulse Rate:  [85-99] 87 (08/01 0823) Resp:  [15-22] 18 (08/01 0823) BP: (111-141)/(54-71) 138/60 (08/01 0823) SpO2:  [96 %-100 %] 99 % (08/01 0823)     Height: 5\' 3"  (160 cm) Weight: 54.7 kg BMI (Calculated): 21.35   Intake/Output last 2 shifts:  07/31 0701 - 08/01 0700 In: 225 [I.V.:225] Out: 250 [Urine:250]   Physical Exam:  Constitutional: alert, cooperative and no distress  HENT: normocephalic without obvious abnormality  Eyes: PERRL, EOM's grossly intact and symmetric  Respiratory: breathing non-labored at rest  Cardiovascular: regular rate and sinus rhythm  Gastrointestinal: Soft, non-tender and non-distended, no rebound/guarding, negative Murphy's Sign Musculoskeletal: No edema or wounds, motor and sensation grossly intact, NT    Labs:  CBC Latest Ref Rng & Units 06/08/2020 06/07/2020 06/06/2020  WBC 4.0 - 10.5 K/uL 7.5 8.8 5.2  Hemoglobin 12.0 - 15.0 g/dL 10.1(L) 10.9(L) 11.0(L)  Hematocrit 36 - 46 % 32.0(L) 32.8(L) 32.9(L)  Platelets 150 - 400 K/uL 174 203 202   CMP Latest Ref Rng & Units 06/08/2020 06/07/2020 06/07/2020   Glucose 70 - 99 mg/dL 88 - 06/09/2020)  BUN 8 - 23 mg/dL 12 - 11  Creatinine 338(S - 1.00 mg/dL 5.05 - 3.97  Sodium 6.73 - 145 mmol/L 136 - 133(L)  Potassium 3.5 - 5.1 mmol/L 4.7 5.3(H) 5.7(H)  Chloride 98 - 111 mmol/L 104 - 102  CO2 22 - 32 mmol/L 27 - 22  Calcium 8.9 - 10.3 mg/dL 8.3(L) - 8.2(L)  Total Protein 6.5 - 8.1 g/dL - - -  Total Bilirubin 0.3 - 1.2 mg/dL - - -  Alkaline Phos 38 - 126 U/L - - -  AST 15 - 41 U/L - - -  ALT 0 - 44 U/L - - -     Imaging studies: No new pertinent imaging studies   Assessment/Plan: (ICD-10's: K80.20) 84 y.o. female with persistent upper abdominal discomfort and difficulity with PO intake of uncertain etiology. There may be some component of biliary colic -vs- symptomatic cholelithiasis however there is no evidence of cholecystitis or choledocholithiasis. She is being followed by GI and has history of gastritis and benign esophageal stenosis.  EGD performed yesterday with pathology pending.   - Due to findings on EGD, it is unlikely that her cholelithiasis is a contributing factor to her symptoms.  At this time, cholecystectomy can be deferred.             - Further management per primary service; we will sign off at this time.

## 2020-06-09 ENCOUNTER — Other Ambulatory Visit: Payer: Self-pay | Admitting: Internal Medicine

## 2020-06-09 ENCOUNTER — Encounter: Payer: Self-pay | Admitting: Internal Medicine

## 2020-06-09 DIAGNOSIS — I10 Essential (primary) hypertension: Secondary | ICD-10-CM

## 2020-06-09 DIAGNOSIS — N182 Chronic kidney disease, stage 2 (mild): Secondary | ICD-10-CM

## 2020-06-09 DIAGNOSIS — E8729 Other acidosis: Secondary | ICD-10-CM

## 2020-06-09 DIAGNOSIS — W19XXXA Unspecified fall, initial encounter: Secondary | ICD-10-CM

## 2020-06-09 DIAGNOSIS — E876 Hypokalemia: Secondary | ICD-10-CM

## 2020-06-09 DIAGNOSIS — L8996 Pressure-induced deep tissue damage of unspecified site: Secondary | ICD-10-CM

## 2020-06-09 DIAGNOSIS — N179 Acute kidney failure, unspecified: Secondary | ICD-10-CM

## 2020-06-09 DIAGNOSIS — R1110 Vomiting, unspecified: Secondary | ICD-10-CM

## 2020-06-09 DIAGNOSIS — E872 Acidosis: Secondary | ICD-10-CM

## 2020-06-09 DIAGNOSIS — K8020 Calculus of gallbladder without cholecystitis without obstruction: Principal | ICD-10-CM

## 2020-06-09 LAB — BASIC METABOLIC PANEL
Anion gap: 6 (ref 5–15)
BUN: 10 mg/dL (ref 8–23)
CO2: 24 mmol/L (ref 22–32)
Calcium: 8.1 mg/dL — ABNORMAL LOW (ref 8.9–10.3)
Chloride: 103 mmol/L (ref 98–111)
Creatinine, Ser: 0.7 mg/dL (ref 0.44–1.00)
GFR calc Af Amer: >60 mL/min (ref >60)
GFR calc non Af Amer: >60 mL/min (ref >60)
Glucose, Bld: 83 mg/dL (ref 70–99)
Potassium: 4.5 mmol/L (ref 3.5–5.1)
Sodium: 133 mmol/L — ABNORMAL LOW (ref 135–145)

## 2020-06-09 LAB — CBC
HCT: 30.7 % — ABNORMAL LOW (ref 36.0–46.0)
Hemoglobin: 10.1 g/dL — ABNORMAL LOW (ref 12.0–15.0)
MCH: 29.7 pg (ref 26.0–34.0)
MCHC: 32.9 g/dL (ref 30.0–36.0)
MCV: 90.3 fL (ref 80.0–100.0)
Platelets: 173 10*3/uL (ref 150–400)
RBC: 3.4 MIL/uL — ABNORMAL LOW (ref 3.87–5.11)
RDW: 15.9 % — ABNORMAL HIGH (ref 11.5–15.5)
WBC: 5.6 10*3/uL (ref 4.0–10.5)
nRBC: 0 % (ref 0.0–0.2)

## 2020-06-09 LAB — GLUCOSE, CAPILLARY
Glucose-Capillary: 74 mg/dL (ref 70–99)
Glucose-Capillary: 80 mg/dL (ref 70–99)

## 2020-06-09 MED ORDER — FOLIC ACID 1 MG PO TABS
1.0000 mg | ORAL_TABLET | Freq: Every day | ORAL | Status: DC
  Administered 2020-06-09: 1 mg via ORAL
  Filled 2020-06-09: qty 1

## 2020-06-09 MED ORDER — VITAMIN B-12 1000 MCG PO TABS: 1000.0000 ug | ORAL_TABLET | Freq: Every day | ORAL | Status: DC

## 2020-06-09 MED ORDER — ALUM & MAG HYDROXIDE-SIMETH 200-200-20 MG/5ML PO SUSP: 15.0000 mL | Freq: Four times a day (QID) | ORAL | 0 refills | Status: DC | PRN

## 2020-06-09 MED ORDER — ONDANSETRON 4 MG PO TBDP: 4.0000 mg | ORAL_TABLET | Freq: Three times a day (TID) | ORAL | 0 refills | Status: DC | PRN

## 2020-06-09 MED ORDER — POLYETHYLENE GLYCOL 3350 17 G PO PACK: 17.0000 g | PACK | Freq: Two times a day (BID) | ORAL | 0 refills | Status: DC | PRN

## 2020-06-09 MED ORDER — CYANOCOBALAMIN 1000 MCG PO TABS: 1000.0000 ug | ORAL_TABLET | Freq: Every day | ORAL | 1 refills | Status: DC

## 2020-06-09 MED ORDER — DOCUSATE SODIUM 100 MG PO CAPS: 100.0000 mg | ORAL_CAPSULE | Freq: Two times a day (BID) | ORAL | 0 refills | Status: DC | PRN

## 2020-06-09 MED ORDER — FOLIC ACID 1 MG PO TABS: 1.0000 mg | ORAL_TABLET | Freq: Every day | ORAL | 0 refills | Status: DC

## 2020-06-09 MED ORDER — CYANOCOBALAMIN 1000 MCG/ML IJ SOLN
1000.0000 ug | Freq: Once | INTRAMUSCULAR | Status: DC
  Filled 2020-06-09: qty 1

## 2020-06-09 NOTE — Progress Notes (Signed)
Patient continues to refused breakfast. Patient states she is "not hungry." Patient further states, "don't tell them. I want to go home."

## 2020-06-09 NOTE — Evaluation (Addendum)
Physical Therapy Evaluation Patient Details Name: Kristen Richard MRN: [id number] DOB: 02/12/1936 Today's Date: 06/09/2020   History of Present Illness  Pt is an 84 y.o. female presenting to hospital 7/26 with N/V and diarrhea x2 weeks.  Pt admitted with N/V, diarrhea, abdominal pain, cholelithiasis, h/o benign esophageal stenosis, electrolyte abnormalities, hyperkalemia, hypoglycemia, elevated troponin, persistent a-fib, and LE edema.  PMH includes GERD, htn, CKD, a-fib, scoliosis, and h/o vertigo.  Clinical Impression  Prior to hospital admission, pt required assist at times for standing but pt then would be able to walk a few feet with RW from lift recliner (that turns into bed) to/from [facility]; uses manual w/c in community; lives in 1 level home with ramp to enter; has family support.  Currently pt is mod to max assist x1 with transfers (sit to stand x4 trials with RW and stand pivot bed to/from recliner).  Pt tends to lean to R in sitting and standing (pt's granddaughter reports this is baseline).  Pt would benefit from skilled PT to address noted impairments and functional limitations (see below for any additional details).  Upon hospital discharge, pt would benefit from STR but pt currently refusing STR and requesting home instead.  Therapist educated pt and pt's granddaughter on therapy discharge recommendations, current assist levels, and safe transfer technique d/t pt requesting to discharge home instead.    Follow Up Recommendations SNF    Equipment Recommendations   (pt has RW, BSC, and manual w/c at home already)    Recommendations for Other Services       Precautions / Restrictions Precautions Precautions: Fall Precaution Comments: Aspiration Restrictions Weight Bearing Restrictions: No      Mobility  Bed Mobility Overal bed mobility: Needs Assistance Bed Mobility: Supine to Sit;Sit to Supine     Supine to sit: Min assist;HOB elevated (minimal assist for trunk) Sit to supine:  Mod assist;Max assist;HOB elevated   General bed mobility comments: assist for trunk and B LE's sit to semi-supine in bed; 2 assist to boost pt up in bed end of session  Transfers Overall transfer level: Needs assistance Equipment used: Rolling walker (2 wheeled) Transfers: Sit to/from Stand Sit to Stand: Mod assist;Max assist         General transfer comment: x2 trials standing with bed at regular height and x2 trials with bed height mildly elevated to simulate surfaces at home;  assist to initiate and come to full stand required; vc's for UE/LE placement and overall technique required  Ambulation/Gait Ambulation/Gait assistance: Total assist Gait Distance (Feet): 0 Feet Assistive device: Rolling walker (2 wheeled)       General Gait Details: pt unable to take any steps with RW use and 1 assist  Stairs            Wheelchair Mobility    Modified Rankin (Stroke Patients Only)       Balance Overall balance assessment: Needs assistance Sitting-balance support: Bilateral upper extremity supported;Feet supported Sitting balance-Leahy Scale: Poor Sitting balance - Comments: pt leaning to R side intermittently (pt's granddaughter reports this is baseline)--able to correct with vc's and minimal assist   Standing balance support: Bilateral upper extremity supported Standing balance-Leahy Scale: Poor Standing balance comment: pt with R lean in standing (pt's granddaughter reporting this is baseline);  min to mod assist for standing balance with UE support on RW  Pertinent Vitals/Pain Pain Assessment: No/denies pain  O2 sats WFL during session on room air HR 98 bpm at rest and increased up to 120 bpm at times with activity.    Home Living Family/patient expects to be discharged to:: Private residence Living Arrangements: Children Available Help at Discharge: Family;Available 24 hours/day (pt's granddaughter, granddaughter's son,  great grand-daughter, and great grandson) Type of Home: House Home Access: Ramped entrance     Home Layout: One level Home Equipment: Walker - 2 wheels;Bedside commode;Cane - single point;Wheelchair - manual Additional Comments: Lift chair that turns into flat bed and also helps pt stand    Prior Function Level of Independence: Needs assistance   Gait / Transfers Assistance Needed: Pt's grand-daughter reporting pt needing assist at times to stand but then normally able to walk a few feet with RW recliner to/from [facility]; uses manual w/c in community  ADL's / Homemaking Assistance Needed: Takes sponge baths  Comments: 1 fall in past 6 months (tripped)     Hand Dominance        Extremity/Trunk Assessment   Upper Extremity Assessment Upper Extremity Assessment: Generalized weakness    Lower Extremity Assessment Lower Extremity Assessment: Generalized weakness    Cervical / Trunk Assessment Cervical / Trunk Assessment:  (tends to lean to R side (pt's granddaughter reports this is normal))  Communication   Communication: No difficulties  Cognition Arousal/Alertness: Awake/alert Behavior During Therapy:  (Forgetful at times) Overall Cognitive Status: Impaired/Different from baseline Area of Impairment: Orientation                 Orientation Level: Disoriented to;Time             General Comments: Pt reporting it was fall season but knew it was 2021; general confusion and forgetfulness noted during session      General Comments   Nursing cleared pt for participation in physical therapy.  Pt agreeable to PT session.  Pt's grand-daughter present during session.    Exercises  Transfer training   Assessment/Plan    PT Assessment Patient needs continued PT services  PT Problem List Decreased strength;Decreased activity tolerance;Decreased balance;Decreased mobility;Decreased cognition;Decreased knowledge of use of DME;Decreased safety awareness;Decreased knowledge  of precautions       PT Treatment Interventions DME instruction;Gait training;Functional mobility training;Therapeutic activities;Therapeutic exercise;Balance training;Patient/family education    PT Goals (Current goals can be found in the Care Plan section)  Acute Rehab PT Goals Patient Stated Goal: to go home PT Goal Formulation: With patient Time For Goal Achievement: 06/23/20 Potential to Achieve Goals: Fair    Frequency Min 2X/week   Barriers to discharge Decreased caregiver support Level of assist available    Co-evaluation               AM-PAC PT "6 Clicks" Mobility  Outcome Measure Help needed turning from your back to your side while in a flat bed without using bedrails?: None Help needed moving from lying on your back to sitting on the side of a flat bed without using bedrails?: A Little Help needed moving to and from a bed to a chair (including a wheelchair)?: A Lot Help needed standing up from a chair using your arms (e.g., wheelchair or bedside chair)?: A Lot Help needed to walk in hospital room?: Total Help needed climbing 3-5 steps with a railing? : Total 6 Click Score: 13    End of Session Equipment Utilized During Treatment: Gait belt Activity Tolerance: Patient limited by fatigue Patient left: in bed;with  call bell/phone within reach;with bed alarm set;with family/visitor present Nurse Communication: Mobility status;Precautions (pt with nausea, mild emesis, and spitting up during session) PT Visit Diagnosis: Other abnormalities of gait and mobility (R26.89);Muscle weakness (generalized) (M62.81);Difficulty in walking, not elsewhere classified (R26.2)    Time: 9892-1194 PT Time Calculation (min) (ACUTE ONLY): 54 min   Charges:   PT Evaluation $PT Eval Low Complexity: 1 Low PT Treatments $Therapeutic Activity: 23-37 mins       Hendricks Limes, PT 06/09/20, 3:27 PM

## 2020-06-09 NOTE — Discharge Summary (Signed)
Physician Discharge Summary  Kristen Richard OHY:[id number] DOB: 03/15/1936 DOA: 06/02/2020  PCP: [facility]  Admit date: 06/02/2020 Discharge date: 06/09/2020  Admitted From: Home Disposition:  Home  Recommendations for Outpatient Follow-up:  1. Follow up with PCP in 1-2 weeks 2. Follow-up with gastroenterology. 3. Please obtain BMP/CBC in one week 4. Please follow up on the following pending results: Esophageal biopsy results.  Home Health: Yes Equipment/Devices: Rolling walker Discharge Condition: Fair CODE STATUS: Full Diet recommendation: Heart Healthy   Brief/Interim Summary: Kristen Richard a 84 y.o.femalewith medical history significant ofafibnot on anticoagulation, gastritis who presented with N/V/D and abdominal pain. Some concern of cholelithiasis without any sign of cholecystitis. Underwent EGD on 06/07/2020 with some concern of nonspecific/eosinophilic esophagitis, biopsies were taken and a mild stenosis of gastroesophageal sphincter which was dilated.  Patient continued vomiting 1 more day after EGD.  Nausea and vomiting resolved.  Continue to have some anorexia.  There was nursing concern of very poor p.o. intake.  When asked patient she said she wants to eat but she does not has her teeth with her.  Talked with her granddaughter who lives with her, agrees to discharge home with home health services as patient really wants to go home stating that she always do better when home.  Outpatient palliative consult was also ordered.  On presentation patient has multiple electrolyte abnormalities most likely secondary to GI losses and poor p.o. intake.  Electrolyte normalized with repletion.  Because of the concern of symptomatic cholelithiasis surgery was consulted.  They do not think that her symptoms are secondary to cholelithiasis and would like to defer cholecystectomy as an outpatient if needed.  Patient found to have anemia, hemoglobin on discharge was 10.   Anemia panel with low folate and B12 of 229.  She was started on supplement and needs to follow-up with her primary care provider for further management.  She also had mildly elevated troponin on admission most likely secondary to demand and dehydration.  No chest pain.  ACS ruled out.  Patient has an history of persistent A. fib.  CHA2DS2-VASc score of 4.  Patient was previously on beta-blocker which was discontinued due to sinus pauses.  Rate remained controlled.  She was not on any anticoagulation due to history of frequent falls.  Her home antihypertensives were initially held due to softer blood pressure.  She can resume her antihypertensives if needed.  She will continue with rest of her home meds and follow-up with her primary care provider for further management.  Discharge Diagnoses:  Active Problems:   Regurgitation of food   Hypochloremia   Hyperkalemia   Metabolic acidosis, increased anion gap   Hypomagnesemia   Symptomatic cholelithiasis  Discharge Instructions  Discharge Instructions    Amb Referral to Palliative Care   Complete by: As directed    Diet - low sodium heart healthy   Complete by: As directed    Discharge instructions   Complete by: As directed    It was pleasure taking care of you. It is very important that you eat well and keep yourself well-hydrated. You can supplement your diet with Ensure or boost as you like. As your hemoglobin was little low, we checked for anemia and found to have low folic acid and B12, you were started on those supplements, continue taking them along with your other multivitamin and follow-up with your primary care physician for further management. Please follow-up with your gastroenterologist for further recommendations of your esophagitis.  Increase activity slowly   Complete by: As directed      Allergies as of 06/09/2020      Reactions   Sulfa Antibiotics Swelling   Pt was told it caused her brain to swell - unc  documented in 2014, pt is unaware of this    Acetazolamide Nausea And Vomiting   Dizziness    Hydrochlorothiazide       Medication List    STOP taking these medications   nitrofurantoin 100 MG capsule Commonly known as: MACRODANTIN     TAKE these medications   acetaminophen 325 MG tablet Commonly known as: TYLENOL Take 325-650 mg by mouth every 6 (six) hours as needed for moderate pain or headache.   alum & mag hydroxide-simeth 200-200-20 MG/5ML suspension Commonly known as: MAALOX/MYLANTA Take 15 mLs by mouth every 6 (six) hours as needed for indigestion or heartburn.   atorvastatin 40 MG tablet Commonly known as: LIPITOR Take 40 mg by mouth daily.   cholecalciferol 25 MCG (1000 UNIT) tablet Commonly known as: VITAMIN D3 Take 1,000 Units by mouth daily.   cyanocobalamin 1000 MCG tablet Take 1 tablet (1,000 mcg total) by mouth daily. Start taking on: June 10, 2020   docusate sodium 100 MG capsule Commonly known as: COLACE Take 1 capsule (100 mg total) by mouth 2 (two) times daily as needed for mild constipation or moderate constipation.   folic acid 1 MG tablet Commonly known as: FOLVITE Take 1 tablet (1 mg total) by mouth daily. Start taking on: June 10, 2020   furosemide 20 MG tablet Commonly known as: LASIX Take 20 mg by mouth daily as needed.   magnesium oxide 400 (241.3 Mg) MG tablet Commonly known as: MAG-OX Take 2 tablets (800 mg total) by mouth daily.   meclizine 12.5 MG tablet Commonly known as: ANTIVERT Take 1 tablet (12.5 mg total) by mouth 3 (three) times daily as needed for dizziness (Or vertigo).   multivitamin with minerals Tabs tablet Take 1 tablet by mouth daily.   ondansetron 4 MG disintegrating tablet Commonly known as: ZOFRAN-ODT Take 1 tablet (4 mg total) by mouth every 8 (eight) hours as needed for nausea or vomiting.   pantoprazole 40 MG tablet Commonly known as: PROTONIX Take 1 tablet (40 mg total) by mouth daily.    phosphorus 155-852-130 MG tablet Commonly known as: K PHOS NEUTRAL Take 2 tablets (500 mg total) by mouth 2 (two) times daily.   polyethylene glycol 17 g packet Commonly known as: MIRALAX / GLYCOLAX Take 17 g by mouth 2 (two) times daily as needed for mild constipation or moderate constipation.       Follow-up Information    Pound, Boykin Nearing, MD. Schedule an appointment as soon as possible for a visit.   Specialty: Gastroenterology Contact information: [address] [place] [address] [phone]        [facility]. Schedule an appointment as soon as possible for a visit.   Specialty: General Practice Contact information: [address] [place] [address] [phone]        Debbe Odea, MD .   Specialties: Cardiology, Radiology Contact information: [address] [place] [address] [phone]              Allergies  Allergen Reactions  . Sulfa Antibiotics Swelling    Pt was told it caused her brain to swell - unc documented in 2014, pt is unaware of this   . Acetazolamide Nausea And Vomiting  Dizziness   . Hydrochlorothiazide     Consultations:  GI  Surgery  Procedures/Studies: US ABDOMEN LIMITED RUQ  Result Date: 06/02/2020 CLINICAL DATA:  Acute right upper quadrant abdominal pain. EXAM: ULTRASOUND ABDOMEN LIMITED RIGHT UPPER QUADRANT COMPARISON:  December 25, 2019. FINDINGS: Gallbladder: Cholelithiasis is noted without gallbladder wall thickening or pericholecystic fluid. No sonographic Murphy's sign is noted. Common bile duct: Diameter: 4 mm which is within normal limits. Liver: No focal lesion identified. Increased echogenicity of hepatic parenchyma is noted suggesting hepatic steatosis or other diffuse hepatocellular disease. Portal vein is patent on color Doppler imaging with normal direction of blood flow towards the liver. Other: None. IMPRESSION: Cholelithiasis without evidence of  cholecystitis. Increased echogenicity of hepatic parenchyma is noted suggesting hepatic steatosis or other diffuse hepatocellular disease. Electronically Signed   By: Lupita Raider M.D.   On: 06/02/2020 15:17   DG ESOPHAGUS W SINGLE CM (SOL OR THIN BA)  Result Date: 06/04/2020 CLINICAL DATA:  Regurgitation of food. EXAM: ESOPHOGRAM/BARIUM SWALLOW TECHNIQUE: Single contrast examination was performed using thin barium or water soluble. FLUOROSCOPY TIME:  Fluoroscopy Time:  54 seconds Number of Acquired Spot Images: 3 COMPARISON:  12/12/2010 chest radiograph. FINDINGS: Examination was limited secondary to limited patient mobility. Initial swallows demonstrated symptomatic aspiration. The study was subsequently aborted. IMPRESSION: Aborted barium swallow secondary to aspiration. Consider modified barium swallow to further assess aspiration risk. These results were called by telephone at the time of interpretation on 06/04/2020 at 12:46 pm to provider TINA LAI , who verbally acknowledged these results. Electronically Signed   By: Stana Bunting M.D.   On: 06/04/2020 12:55     Subjective: Patient has no new complaint today.  She wants to go home.  Per nursing concern she is not eating well.  When I ask she said she does not has her teeth with her and willing to eat once go home.  Discharge Exam: Vitals:   06/09/20 0627 06/09/20 0755  BP: (!) 134/91 (!) 146/63  Pulse: 98 87  Resp: 18 18  Temp: 98.2 F (36.8 C) 97.6 F (36.4 C)  SpO2:  100%   Vitals:   06/08/20 2028 06/09/20 0003 06/09/20 0627 06/09/20 0755  BP: (!) 136/63 (!) 132/64 (!) 134/91 (!) 146/63  Pulse: 92 93 98 87  Resp: Temp: 98.5 F (36.9 C) 98.2 F (36.8 C) 98.2 F (36.8 C) 97.6 F (36.4 C)  TempSrc: Oral Oral Oral Oral  SpO2: 100% 100%  100%  Weight:      Height:        General: Pt is alert, awake, not in acute distress Cardiovascular: RRR, S1/S2 +, no rubs, no gallops Respiratory: CTA bilaterally, no  wheezing, no rhonchi Abdominal: Soft, NT, ND, bowel sounds + Extremities: no edema, no cyanosis   The results of significant diagnostics from this hospitalization (including imaging, microbiology, ancillary and laboratory) are listed below for reference.    Microbiology: Recent Results (from the past 240 hour(s))  SARS Coronavirus 2 by RT PCR (hospital order, performed in [facility] hospital lab) Nasopharyngeal Nasopharyngeal Swab     Status: None   Collection Time: 06/02/20  2:34 PM   Specimen: Nasopharyngeal Swab  Result Value Ref Range Status   SARS Coronavirus 2 NEGATIVE NEGATIVE Final    Comment: (NOTE) SARS-CoV-2 target nucleic acids are NOT DETECTED.  The SARS-CoV-2 RNA is generally detectable in upper and lower respiratory specimens during the acute phase of infection. The lowest concentration of  SARS-CoV-2 viral copies this assay can detect is 250 copies / mL. A negative result does not preclude SARS-CoV-2 infection and should not be used as the sole basis for treatment or other patient management decisions.  A negative result may occur with improper specimen collection / handling, submission of specimen other than nasopharyngeal swab, presence of viral mutation(s) within the areas targeted by this assay, and inadequate number of viral copies (<250 copies / mL). A negative result must be combined with clinical observations, patient history, and epidemiological information.  Fact Sheet for Patients:   BoilerBrush.com.cyhttps://www.fda.gov/media/136312/download  Fact Sheet for Healthcare Providers: https://pope.com/https://www.fda.gov/media/136313/download  This test is not yet approved or  cleared by the [place] FDA and has been authorized for detection and/or diagnosis of SARS-CoV-2 by FDA under an Emergency Use Authorization (EUA).  This EUA will remain in effect (meaning this test can be used) for the duration of the COVID-19 declaration under Section 564(b)(1) of the Act, 21 U.S.C. section  360bbb-3(b)(1), unless the authorization is terminated or revoked sooner.  Performed at [facility], [address] [place], [place] [id number]      Labs: BNP (last 3 results) No results for input(s): BNP in the last 8760 hours. Basic Metabolic Panel: Recent Labs  Lab 06/04/20 0505 06/04/20 0505 06/05/20 0513 06/05/20 0513 06/06/20 1003 06/07/20 0656 06/07/20 1723 06/08/20 0521 06/09/20 0451  NA 136   < > 135  --  136 133*  --  136 133*  K 2.7*   < > 3.5   < > 5.3* 5.7* 5.3* 4.7 4.5  CL 87*   < > 93*  --  101 102  --  104 103  CO2 37*   < > 33*  --  27 22  --  27 24  GLUCOSE 82   < > 106*  --  82 110*  --  88 83  BUN 12   < > 9  --  6* 11  --  12 10  CREATININE 0.77   < > 0.76  --  0.63 0.71  --  0.63 0.70  CALCIUM 8.1*   < > 8.1*  --  8.0* 8.2*  --  8.3* 8.1*  MG 2.2  --  1.8  --  1.4* 2.1  --  1.8  --   PHOS 2.4*  --  1.7*  --  3.5 3.1  --  2.5  --    < > = values in this interval not displayed.   Liver Function Tests: Recent Labs  Lab 06/02/20 1330 06/05/20 0513  AST 43* 31  ALT 17 14  ALKPHOS 68 57  BILITOT 2.6* 1.5*  PROT 6.9 5.4*  ALBUMIN 3.1* 2.4*   Recent Labs  Lab 06/02/20 1330  LIPASE 26   No results for input(s): AMMONIA in the last 168 hours. CBC: Recent Labs  Lab 06/05/20 0513 06/06/20 1003 06/07/20 0656 06/08/20 0521 06/09/20 0451  WBC 6.6 5.2 8.8 7.5 5.6  HGB 11.0* 11.0* 10.9* 10.1* 10.1*  HCT 33.5* 32.9* 32.8* 32.0* 30.7*  MCV 89.3 89.4 90.1 93.3 90.3  PLT 214 202 203 174 173   Cardiac Enzymes: No results for input(s): CKTOTAL, CKMB, CKMBINDEX, TROPONINI in the last 168 hours. BNP: Invalid input(s): POCBNP CBG: Recent Labs  Lab 06/08/20 0434 06/08/20 1149 06/08/20 1840 06/09/20 0004 06/09/20 0629  GLUCAP 80 80 95 80 74   D-Dimer No results for input(s): DDIMER in the last 72 hours. Hgb A1c No results for input(s): HGBA1C  in the last 72 hours. Lipid Profile No results for input(s): CHOL, HDL, LDLCALC,  TRIG, CHOLHDL, LDLDIRECT in the last 72 hours. Thyroid function studies No results for input(s): TSH, T4TOTAL, T3FREE, THYROIDAB in the last 72 hours.  Invalid input(s): FREET3 Anemia work up Recent Labs    06/08/20 1456  VITAMINB12 229  FOLATE 1.3*  FERRITIN 305  TIBC 119*  IRON 72  RETICCTPCT 1.0   Urinalysis    Component Value Date/Time   COLORURINE AMBER (A) 06/02/2020 1549   APPEARANCEUR HAZY (A) 06/02/2020 1549   LABSPEC 1.018 06/02/2020 1549   PHURINE 6.0 06/02/2020 1549   GLUCOSEU 50 (A) 06/02/2020 1549   HGBUR SMALL (A) 06/02/2020 1549   BILIRUBINUR NEGATIVE 06/02/2020 1549   KETONESUR 20 (A) 06/02/2020 1549   PROTEINUR NEGATIVE 06/02/2020 1549   NITRITE NEGATIVE 06/02/2020 1549   LEUKOCYTESUR NEGATIVE 06/02/2020 1549   Sepsis Labs Invalid input(s): PROCALCITONIN,  WBC,  LACTICIDVEN Microbiology Recent Results (from the past 240 hour(s))  SARS Coronavirus 2 by RT PCR (hospital order, performed in [facility] hospital lab) Nasopharyngeal Nasopharyngeal Swab     Status: None   Collection Time: 06/02/20  2:34 PM   Specimen: Nasopharyngeal Swab  Result Value Ref Range Status   SARS Coronavirus 2 NEGATIVE NEGATIVE Final    Comment: (NOTE) SARS-CoV-2 target nucleic acids are NOT DETECTED.  The SARS-CoV-2 RNA is generally detectable in upper and lower respiratory specimens during the acute phase of infection. The lowest concentration of SARS-CoV-2 viral copies this assay can detect is 250 copies / mL. A negative result does not preclude SARS-CoV-2 infection and should not be used as the sole basis for treatment or other patient management decisions.  A negative result may occur with improper specimen collection / handling, submission of specimen other than nasopharyngeal swab, presence of viral mutation(s) within the areas targeted by this assay, and inadequate number of viral copies (<250 copies / mL). A negative result must be combined with  clinical observations, patient history, and epidemiological information.  Fact Sheet for Patients:   BoilerBrush.com.cy  Fact Sheet for Healthcare Providers: https://pope.com/  This test is not yet approved or  cleared by the [place] FDA and has been authorized for detection and/or diagnosis of SARS-CoV-2 by FDA under an Emergency Use Authorization (EUA).  This EUA will remain in effect (meaning this test can be used) for the duration of the COVID-19 declaration under Section 564(b)(1) of the Act, 21 U.S.C. section 360bbb-3(b)(1), unless the authorization is terminated or revoked sooner.  Performed at [facility], [address] [place], [place] [address]     Time coordinating discharge: Over 30 minutes  SIGNED:  Arnetha Courser, MD  [facility] 06/09/2020, 12:21 PM  If 7PM-7AM, please contact night-coverage [email or web address]  This record has been created using Conservation officer, historic buildings. Errors have been sought and corrected,but may not always be located. Such creation errors do not reflect on the standard of care.

## 2020-06-09 NOTE — Care Management Important Message (Signed)
Important Message  Patient Details  Name: Kristen Richard MRN: [id number] Date of Birth: [birth year]   Medicare Important Message Given:  Yes     Olegario Messier A [name] 06/09/2020, 12:26 PM

## 2020-06-09 NOTE — Progress Notes (Signed)
Pt discharged to home in stable condition. Pt still refusing to eat or drink sufficient amounts. MD and family, both aware. Pt had to be lifted from bed to w/c and w/c to car. Unable to transfer without max assistance.

## 2020-06-09 NOTE — Progress Notes (Signed)
Civil engineer, contracting hospital Liaison note: New referral for [employer] community Palliative to follow at home received from [facility]. Patient information given to referral. Plan is for discharge home today, patient will be followed by [facility] home health. Thank you. Dayna Barker BSN, RN, [facility] [employer] [phone]

## 2020-06-09 NOTE — TOC Transition Note (Signed)
Transition of Care [facility]) - CM/SW Discharge Note   Patient Details  Name: Kristen Richard MRN: [id number] Date of Birth: 1936/01/14  Transition of Care [facility]) CM/SW Contact:  Allayne Butcher, RN Phone Number: 06/09/2020, 4:11 PM   Clinical Narrative:    Patient declines going to skilled nursing facility and wants to go home even though PT has recommended skilled.  Grandaughter is here to pick patient up and carry her home.  Patient will have home health services through [facility] for RN, PT, aide and SW.  Christy Gentles, with [facility] notified of discharge.    Final next level of care: Home w Home Health Services Barriers to Discharge: Barriers Resolved   Patient Goals and CMS Choice Patient states their goals for this hospitalization and ongoing recovery are:: Patient's daughter would be okay with patient going to SNF or home with home health CMS Medicare.gov Compare Post Acute Care list provided to:: Patient Choice offered to / list presented to : Patient  Discharge Placement                       Discharge Plan and Services   Discharge Planning Services: CM Consult Post Acute Care Choice: Home Health                    HH Arranged: RN, PT, Nurse's Aide [facility] Agency: [facility] Date [facility] Agency Contacted: 06/09/20 Time HH Agency Contacted: 1611 Representative spoke with at [facility] Agency: Christy Gentles  Social Determinants of Health (SDOH) Interventions     Readmission Risk Interventions No flowsheet data found.

## 2020-06-10 LAB — GLUCOSE, CAPILLARY: Glucose-Capillary: 141 mg/dL — ABNORMAL HIGH (ref 70–99)

## 2020-06-10 LAB — SURGICAL PATHOLOGY

## 2020-06-18 NOTE — Progress Notes (Deleted)
Office Visit    Patient Name: Kristen Richard Date of Encounter: 06/18/2020  Primary Care Provider:  [facility] Primary Cardiologist:  Debbe Odea, MD Electrophysiologist:  None   Chief Complaint    Kristen Richard is a 84 y.o. female with a hx of HTN, persistent atrial fibrillation, DDD, GERD, HTN presents today for hospital follow up.    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    Hypertension    Intermittent atrial fibrillation (HCC)    Scoliosis    Vertigo    Past Surgical History:  Procedure Laterality Date   CATARACT EXTRACTION W/PHACO Left 12/28/2018   Procedure: CATARACT EXTRACTION PHACO AND INTRAOCULAR LENS PLACEMENT (IOC) LEFT;  Surgeon: Elliot Cousin, MD;  Location: [facility] ORS;  Service: Ophthalmology;  Laterality: Left;  [place]   01:35 CDE 15.57 Fluid pack lot # [id number]   ESOPHAGOGASTRODUODENOSCOPY N/A 06/07/2020   Procedure: ESOPHAGOGASTRODUODENOSCOPY (EGD);  Surgeon: Toledo, Boykin Nearing, MD;  Location: [facility] ENDOSCOPY;  Service: Gastroenterology;  Laterality: N/A;   ESOPHAGOGASTRODUODENOSCOPY (EGD) WITH PROPOFOL N/A 12/27/2019   Procedure: ESOPHAGOGASTRODUODENOSCOPY (EGD) WITH PROPOFOL;  Surgeon: Wyline Mood, MD;  Location: [facility] ENDOSCOPY;  Service: Gastroenterology;  Laterality: N/A;   TUBAL LIGATION      Allergies  Allergies  Allergen Reactions   Sulfa Antibiotics Swelling    Pt was told it caused her brain to swell - unc documented in 2014, pt is unaware of this    Acetazolamide Nausea And Vomiting    Dizziness    Hydrochlorothiazide     History of Present Illness    Kristen Richard is a 84 y.o. female with a hx of *** last seen ***  Echo 10/2019 normal LVEF 60-65%, mild MR, trivial AI, mild LA dilation.   Hospitalized 12/2019 with AKI and bradycardia  with 3 second pauses. Lisinopril and metoprolol were discontinued.   Seen 05/26/20 in consultation by Dr. Azucena Cecil for atrial fibrillation. She reported no palpitations, but did endorse frequent falls. She was in rate controlled atrial fibrillation. She was recommended for echocardiogram. Anticoagulation was deferred due to frequent falls.   She was admitted 06/02/20 - 06/09/20 after presenting with nausea, vomiting, diarrhea and abdominal pain. Underwent EGD 06/07/20 with concern for nonspecific/eosinoprilic esophagitis, biopsies collected, and mild stenosis of gastroesophageal sphincter was dilated. Electrolytes were repleted. Surgery was consulted for concern of symptomatic cholelithiasis, but low suspicion symptoms were due to cholelithiasis.  ***  EKGs/Labs/Other Studies Reviewed:   The following studies were reviewed today: ***  EKG:  EKG is *** ordered today.  The ekg ordered today demonstrates ***  Recent Labs: 06/05/2020: ALT 14 06/08/2020: Magnesium 1.8 06/09/2020: BUN 10; Creatinine, Ser 0.70; Hemoglobin 10.1; Platelets 173; Potassium 4.5; Sodium 133  Recent Lipid Panel    Component Value Date/Time   CHOL 149 10/27/2019 0440   TRIG 65 10/27/2019 0440   HDL 64 10/27/2019 0440   CHOLHDL 2.3 10/27/2019 0440   VLDL 13 10/27/2019 0440   LDLCALC 72 10/27/2019 0440    Home Medications   No outpatient medications have been marked as taking for the 06/19/20 encounter (Appointment) with Alver Sorrow, NP.      Review of Systems    ***   ROS All other systems reviewed and are otherwise negative except as noted above.  Physical Exam    VS:  There were no vitals taken for this visit. , BMI There is no height or weight on file to calculate BMI. GEN: Well nourished, well developed, in no acute distress. HEENT: normal. Neck: Supple, no JVD, carotid bruits, or masses. Cardiac: ***RRR, no murmurs, rubs, or gallops. No clubbing, cyanosis, edema.  ***Radials/DP/PT 2+ and equal  bilaterally.  Respiratory:  ***Respirations regular and unlabored, clear to auscultation bilaterally. GI: Soft, nontender, nondistended, BS + x 4. MS: No deformity or atrophy. Skin: Warm and dry, no rash. Neuro:  Strength and sensation are intact. Psych: Normal affect.  Accessory Clinical Findings    ECG personally reviewed by me today - *** - no acute changes.  Assessment & Plan    1. ***  Disposition: Follow up {follow up:15908} with ***   Alver Sorrow, NP 06/18/2020, 8:30 PM

## 2020-06-19 ENCOUNTER — Telehealth: Payer: Self-pay | Admitting: Adult Health Nurse Practitioner

## 2020-06-19 ENCOUNTER — Ambulatory Visit: Payer: Medicare HMO | Admitting: Family

## 2020-06-19 NOTE — Telephone Encounter (Signed)
Spoke with patient's daughter, [name] regarding Palliative services and she was in agreement with scheduling a visit.  I have scheduled an In-person Consult for 06/27/20 @ 2 PM

## 2020-06-20 ENCOUNTER — Encounter: Payer: Self-pay | Admitting: Family

## 2020-06-23 ENCOUNTER — Emergency Department: Payer: Medicare HMO

## 2020-06-23 ENCOUNTER — Inpatient Hospital Stay
Admission: EM | Admit: 2020-06-23 | Discharge: 2020-07-03 | DRG: 438 | Disposition: A | Discharge status: Hospice | Payer: Medicare HMO | Attending: Internal Medicine | Admitting: Internal Medicine

## 2020-06-23 ENCOUNTER — Other Ambulatory Visit: Payer: Self-pay

## 2020-06-23 DIAGNOSIS — Z961 Presence of intraocular lens: Secondary | ICD-10-CM | POA: Diagnosis present

## 2020-06-23 DIAGNOSIS — R627 Adult failure to thrive: Secondary | ICD-10-CM | POA: Diagnosis not present

## 2020-06-23 DIAGNOSIS — L8990 Pressure ulcer of unspecified site, unspecified stage: Secondary | ICD-10-CM | POA: Diagnosis present

## 2020-06-23 DIAGNOSIS — E162 Hypoglycemia, unspecified: Secondary | ICD-10-CM | POA: Diagnosis present

## 2020-06-23 DIAGNOSIS — E43 Unspecified severe protein-calorie malnutrition: Secondary | ICD-10-CM | POA: Diagnosis present

## 2020-06-23 DIAGNOSIS — Z833 Family history of diabetes mellitus: Secondary | ICD-10-CM

## 2020-06-23 DIAGNOSIS — R338 Other retention of urine: Secondary | ICD-10-CM

## 2020-06-23 DIAGNOSIS — I4819 Other persistent atrial fibrillation: Secondary | ICD-10-CM | POA: Diagnosis present

## 2020-06-23 DIAGNOSIS — E876 Hypokalemia: Secondary | ICD-10-CM | POA: Diagnosis present

## 2020-06-23 DIAGNOSIS — N182 Chronic kidney disease, stage 2 (mild): Secondary | ICD-10-CM | POA: Diagnosis present

## 2020-06-23 DIAGNOSIS — K2100 Gastro-esophageal reflux disease with esophagitis, without bleeding: Secondary | ICD-10-CM | POA: Diagnosis present

## 2020-06-23 DIAGNOSIS — K8689 Other specified diseases of pancreas: Principal | ICD-10-CM | POA: Diagnosis present

## 2020-06-23 DIAGNOSIS — Z87891 Personal history of nicotine dependence: Secondary | ICD-10-CM

## 2020-06-23 DIAGNOSIS — D649 Anemia, unspecified: Secondary | ICD-10-CM

## 2020-06-23 DIAGNOSIS — Z20822 Contact with and (suspected) exposure to covid-19: Secondary | ICD-10-CM | POA: Diagnosis present

## 2020-06-23 DIAGNOSIS — E860 Dehydration: Secondary | ICD-10-CM | POA: Diagnosis present

## 2020-06-23 DIAGNOSIS — Z4659 Encounter for fitting and adjustment of other gastrointestinal appliance and device: Secondary | ICD-10-CM

## 2020-06-23 DIAGNOSIS — D696 Thrombocytopenia, unspecified: Secondary | ICD-10-CM | POA: Diagnosis present

## 2020-06-23 DIAGNOSIS — G9341 Metabolic encephalopathy: Secondary | ICD-10-CM | POA: Diagnosis not present

## 2020-06-23 DIAGNOSIS — R638 Other symptoms and signs concerning food and fluid intake: Secondary | ICD-10-CM

## 2020-06-23 DIAGNOSIS — R531 Weakness: Secondary | ICD-10-CM

## 2020-06-23 DIAGNOSIS — Z66 Do not resuscitate: Secondary | ICD-10-CM

## 2020-06-23 DIAGNOSIS — Z882 Allergy status to sulfonamides status: Secondary | ICD-10-CM

## 2020-06-23 DIAGNOSIS — K909 Intestinal malabsorption, unspecified: Secondary | ICD-10-CM | POA: Diagnosis present

## 2020-06-23 DIAGNOSIS — I4821 Permanent atrial fibrillation: Secondary | ICD-10-CM

## 2020-06-23 DIAGNOSIS — R197 Diarrhea, unspecified: Secondary | ICD-10-CM | POA: Diagnosis present

## 2020-06-23 DIAGNOSIS — R339 Retention of urine, unspecified: Secondary | ICD-10-CM | POA: Diagnosis present

## 2020-06-23 DIAGNOSIS — Z888 Allergy status to other drugs, medicaments and biological substances status: Secondary | ICD-10-CM

## 2020-06-23 DIAGNOSIS — Z79899 Other long term (current) drug therapy: Secondary | ICD-10-CM

## 2020-06-23 DIAGNOSIS — I129 Hypertensive chronic kidney disease with stage 1 through stage 4 chronic kidney disease, or unspecified chronic kidney disease: Secondary | ICD-10-CM | POA: Diagnosis present

## 2020-06-23 DIAGNOSIS — D519 Vitamin B12 deficiency anemia, unspecified: Secondary | ICD-10-CM | POA: Diagnosis present

## 2020-06-23 DIAGNOSIS — I10 Essential (primary) hypertension: Secondary | ICD-10-CM | POA: Diagnosis present

## 2020-06-23 DIAGNOSIS — Z515 Encounter for palliative care: Secondary | ICD-10-CM

## 2020-06-23 DIAGNOSIS — Z8249 Family history of ischemic heart disease and other diseases of the circulatory system: Secondary | ICD-10-CM

## 2020-06-23 DIAGNOSIS — E785 Hyperlipidemia, unspecified: Secondary | ICD-10-CM | POA: Diagnosis present

## 2020-06-23 DIAGNOSIS — R296 Repeated falls: Secondary | ICD-10-CM | POA: Diagnosis present

## 2020-06-23 DIAGNOSIS — Z9842 Cataract extraction status, left eye: Secondary | ICD-10-CM

## 2020-06-23 LAB — CBC
HCT: 27.6 % — ABNORMAL LOW (ref 36.0–46.0)
Hemoglobin: 9.9 g/dL — ABNORMAL LOW (ref 12.0–15.0)
MCH: 30.4 pg (ref 26.0–34.0)
MCHC: 35.9 g/dL (ref 30.0–36.0)
MCV: 84.7 fL (ref 80.0–100.0)
Platelets: 127 10*3/uL — ABNORMAL LOW (ref 150–400)
RBC: 3.26 MIL/uL — ABNORMAL LOW (ref 3.87–5.11)
RDW: 15.9 % — ABNORMAL HIGH (ref 11.5–15.5)
WBC: 6.9 10*3/uL (ref 4.0–10.5)
nRBC: 0.3 % — ABNORMAL HIGH (ref 0.0–0.2)

## 2020-06-23 LAB — COMPREHENSIVE METABOLIC PANEL
ALT: 15 U/L (ref 0–44)
AST: 23 U/L (ref 15–41)
Albumin: 2.7 g/dL — ABNORMAL LOW (ref 3.5–5.0)
Alkaline Phosphatase: 67 U/L (ref 38–126)
Anion gap: 15 (ref 5–15)
BUN: 22 mg/dL (ref 8–23)
CO2: 29 mmol/L (ref 22–32)
Calcium: 8.2 mg/dL — ABNORMAL LOW (ref 8.9–10.3)
Chloride: 91 mmol/L — ABNORMAL LOW (ref 98–111)
Creatinine, Ser: 0.69 mg/dL (ref 0.44–1.00)
GFR calc Af Amer: >60 mL/min (ref >60)
GFR calc non Af Amer: >60 mL/min (ref >60)
Glucose, Bld: 82 mg/dL (ref 70–99)
Potassium: 2.4 mmol/L — CL (ref 3.5–5.1)
Sodium: 135 mmol/L (ref 135–145)
Total Bilirubin: 1.9 mg/dL — ABNORMAL HIGH (ref 0.3–1.2)
Total Protein: 6 g/dL — ABNORMAL LOW (ref 6.5–8.1)

## 2020-06-23 LAB — GLUCOSE, CAPILLARY
Glucose-Capillary: 65 mg/dL — ABNORMAL LOW (ref 70–99)
Glucose-Capillary: 84 mg/dL (ref 70–99)

## 2020-06-23 LAB — SARS CORONAVIRUS 2 BY RT PCR (HOSPITAL ORDER, PERFORMED IN ~~LOC~~ HOSPITAL LAB): SARS Coronavirus 2: NEGATIVE

## 2020-06-23 LAB — MAGNESIUM: Magnesium: 1.2 mg/dL — ABNORMAL LOW (ref 1.7–2.4)

## 2020-06-23 LAB — LIPASE, BLOOD: Lipase: 23 U/L (ref 11–51)

## 2020-06-23 MED ORDER — LACTATED RINGERS IV SOLN: INTRAVENOUS | Status: DC

## 2020-06-23 MED ORDER — ONDANSETRON 4 MG PO TBDP
8.0000 mg | ORAL_TABLET | Freq: Once | ORAL | Status: DC
  Filled 2020-06-23: qty 2

## 2020-06-23 MED ORDER — MAGNESIUM OXIDE 400 (241.3 MG) MG PO TABS
800.0000 mg | ORAL_TABLET | Freq: Every day | ORAL | Status: DC
  Administered 2020-06-23 – 2020-06-24 (×2): 800 mg via ORAL
  Filled 2020-06-23 (×2): qty 2

## 2020-06-23 MED ORDER — K PHOS MONO-SOD PHOS DI & MONO 155-852-130 MG PO TABS
500.0000 mg | ORAL_TABLET | Freq: Two times a day (BID) | ORAL | Status: DC
  Administered 2020-06-24: 500 mg via ORAL
  Filled 2020-06-23 (×2): qty 2

## 2020-06-23 MED ORDER — POTASSIUM CHLORIDE 10 MEQ/100ML IV SOLN
10.0000 meq | INTRAVENOUS | Status: AC
  Administered 2020-06-23 (×2): 10 meq via INTRAVENOUS
  Filled 2020-06-23 (×2): qty 100

## 2020-06-23 MED ORDER — ENOXAPARIN SODIUM 40 MG/0.4ML ~~LOC~~ SOLN
40.0000 mg | SUBCUTANEOUS | Status: DC
  Administered 2020-06-23 – 2020-07-01 (×9): 40 mg via SUBCUTANEOUS
  Filled 2020-06-23 (×9): qty 0.4

## 2020-06-23 MED ORDER — DOCUSATE SODIUM 100 MG PO CAPS: 100.0000 mg | ORAL_CAPSULE | Freq: Two times a day (BID) | ORAL | Status: DC | PRN

## 2020-06-23 MED ORDER — POTASSIUM CHLORIDE 10 MEQ/100ML IV SOLN
10.0000 meq | INTRAVENOUS | Status: AC
  Administered 2020-06-23 (×3): 10 meq via INTRAVENOUS
  Filled 2020-06-23 (×3): qty 100

## 2020-06-23 MED ORDER — ATORVASTATIN CALCIUM 20 MG PO TABS
40.0000 mg | ORAL_TABLET | Freq: Every day | ORAL | Status: DC
  Administered 2020-06-23 – 2020-06-24 (×2): 40 mg via ORAL
  Filled 2020-06-23 (×2): qty 2

## 2020-06-23 MED ORDER — LACTATED RINGERS IV BOLUS
1000.0000 mL | Freq: Once | INTRAVENOUS | Status: AC
  Administered 2020-06-23: 1000 mL via INTRAVENOUS

## 2020-06-23 MED ORDER — VITAMIN D 25 MCG (1000 UNIT) PO TABS
1000.0000 [IU] | ORAL_TABLET | Freq: Every day | ORAL | Status: DC
  Administered 2020-06-24: 1000 [IU] via ORAL
  Filled 2020-06-23 (×4): qty 1

## 2020-06-23 MED ORDER — POTASSIUM CHLORIDE 20 MEQ PO PACK
40.0000 meq | PACK | Freq: Once | ORAL | Status: DC
  Filled 2020-06-23: qty 2

## 2020-06-23 MED ORDER — ALUM & MAG HYDROXIDE-SIMETH 200-200-20 MG/5ML PO SUSP: 15.0000 mL | Freq: Four times a day (QID) | ORAL | Status: DC | PRN

## 2020-06-23 MED ORDER — PANTOPRAZOLE SODIUM 40 MG IV SOLR
40.0000 mg | Freq: Every day | INTRAVENOUS | Status: DC
  Administered 2020-06-23 – 2020-06-26 (×4): 40 mg via INTRAVENOUS
  Filled 2020-06-23 (×4): qty 40

## 2020-06-23 MED ORDER — FOLIC ACID 1 MG PO TABS
1.0000 mg | ORAL_TABLET | Freq: Every day | ORAL | Status: DC
  Administered 2020-06-23: 1 mg via ORAL
  Filled 2020-06-23: qty 1

## 2020-06-23 MED ORDER — ONDANSETRON HCL 4 MG/2ML IJ SOLN: 4.0000 mg | Freq: Once | INTRAMUSCULAR | Status: AC

## 2020-06-23 MED ORDER — VITAMIN B-12 1000 MCG PO TABS
1000.0000 ug | ORAL_TABLET | Freq: Every day | ORAL | Status: DC
  Administered 2020-06-24: 1000 ug via ORAL
  Filled 2020-06-23 (×3): qty 1

## 2020-06-23 MED ORDER — ONDANSETRON HCL 4 MG/2ML IJ SOLN
INTRAMUSCULAR | Status: AC
  Administered 2020-06-23: 4 mg via INTRAVENOUS
  Filled 2020-06-23: qty 2

## 2020-06-23 NOTE — ED Notes (Signed)
Patient refusing in and out cath at this time. Patient peri area cleaned and purewick placed on patient.

## 2020-06-23 NOTE — ED Provider Notes (Signed)
[facility] Emergency Department Provider Note  ____________________________________________  Time seen: Approximately 1:58 PM  I have reviewed the triage vital signs and the nursing notes.   HISTORY  Chief Complaint Failure to thrive    Level 5 Caveat: Portions of the History and Physical including HPI and review of systems are unable to be completely obtained due to patient being a poor historian   HPI Kristen Richard is a 84 y.o. female with a history of hypertension, atrial fibrillation, GERD who was sent to the ED from home due to poor oral intake over the last several months, bowel and bladder incontinence.  Per EMS, patient family has reported the patient does not get up and is having frequent diarrhea.  Patient denies any complaints to me, denies pain or falls.  Agrees that her family does not think she eats enough but she thinks that her intake is adequate.      Past Medical History:  Diagnosis Date  . DDD (degenerative disc disease), lumbar   . Fall    RISK  . GERD (gastroesophageal reflux disease)   . 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 Hypertension   . Intermittent atrial fibrillation (HCC)   . Scoliosis   . Vertigo      Patient Active Problem List   Diagnosis Date Noted  . Weakness 06/23/2020  . Acute metabolic encephalopathy 06/23/2020  . Failure to thrive in adult 06/23/2020  . Persistent atrial fibrillation (HCC) 06/23/2020  . Anemia 06/23/2020  . Thrombocytopenia (HCC) 06/23/2020  . Metabolic acidosis, increased anion gap   . Hypomagnesemia   . Symptomatic cholelithiasis   . Hyperkalemia   . Hypochloremia 06/02/2020  . Regurgitation of food   . AKI (acute kidney injury) (HCC) 12/25/2019  . Abdominal pain 12/25/2019  . Hypertension   . Hypokalemia   . GERD (gastroesophageal reflux disease)   . Fall   . Dehydration   . Pressure injury of skin 10/27/2019  . Rhabdomyolysis 10/26/2019  . Chronic kidney  disease, stage II (mild) 10/29/2013  . Hematuria 09/24/2013     Past Surgical History:  Procedure Laterality Date  . CATARACT EXTRACTION W/PHACO Left 12/28/2018   Procedure: CATARACT EXTRACTION PHACO AND INTRAOCULAR LENS PLACEMENT (IOC) LEFT;  Surgeon: Elliot Cousin, MD;  Location: [facility] ORS;  Service: Ophthalmology;  Laterality: Left;  [place]   01:35 CDE 15.57 Fluid pack lot # [id number]  . ESOPHAGOGASTRODUODENOSCOPY N/A 06/07/2020   Procedure: ESOPHAGOGASTRODUODENOSCOPY (EGD);  Surgeon: Toledo, Boykin Nearing, MD;  Location: [facility] ENDOSCOPY;  Service: Gastroenterology;  Laterality: N/A;  . ESOPHAGOGASTRODUODENOSCOPY (EGD) WITH PROPOFOL N/A 12/27/2019   Procedure: ESOPHAGOGASTRODUODENOSCOPY (EGD) WITH PROPOFOL;  Surgeon: Wyline Mood, MD;  Location: [facility] ENDOSCOPY;  Service: Gastroenterology;  Laterality: N/A;  . TUBAL LIGATION       Prior to Admission medications   Medication Sig Start Date End Date Taking? Authorizing Provider  acetaminophen (TYLENOL) 325 MG tablet Take 325-650 mg by mouth every 6 (six) hours as needed for moderate pain or headache.    Yes [provider]  alum & mag hydroxide-simeth (MAALOX/MYLANTA) 200-200-20 MG/5ML suspension Take 15 mLs by mouth every 6 (six) hours as needed for indigestion or heartburn. 06/09/20  Yes Arnetha Courser, MD  atorvastatin (LIPITOR) 40 MG tablet Take 40 mg by mouth at bedtime.  01/19/20  Yes [provider]  bimatoprost (LUMIGAN) 0.03 % ophthalmic solution Place 1 drop into both eyes daily.   Yes [provider]  cholecalciferol (VITAMIN D3) 25 MCG (1000  UNIT) tablet Take 1,000 Units by mouth daily. 12/10/19  Yes [provider]  docusate sodium (COLACE) 100 MG capsule Take 1 capsule (100 mg total) by mouth 2 (two) times daily as needed for mild constipation or moderate constipation. 06/09/20  Yes Arnetha Courser, MD  folic acid (FOLVITE) 1 MG tablet Take 1 tablet (1 mg total) by mouth daily. 06/10/20  Yes Arnetha Courser, MD   furosemide (LASIX) 20 MG tablet Take 20 mg by mouth daily as needed for fluid or edema.  05/01/20  Yes [provider]  magnesium oxide (MAG-OX) 400 (241.3 Mg) MG tablet Take 2 tablets (800 mg total) by mouth daily. 01/01/20  Yes Lynn Ito, MD  Multiple Vitamin (MULTIVITAMIN WITH MINERALS) TABS tablet Take 1 tablet by mouth daily. 01/01/20  Yes Lynn Ito, MD  ondansetron (ZOFRAN-ODT) 4 MG disintegrating tablet Take 1 tablet (4 mg total) by mouth every 8 (eight) hours as needed for nausea or vomiting. 06/09/20  Yes Arnetha Courser, MD  pantoprazole (PROTONIX) 40 MG tablet Take 1 tablet (40 mg total) by mouth daily. 01/01/20  Yes Lynn Ito, MD  phosphorus (K PHOS NEUTRAL) 155-852-130 MG tablet Take 2 tablets (500 mg total) by mouth 2 (two) times daily. 01/01/20  Yes Lynn Ito, MD  polyethylene glycol powder (GLYCOLAX/MIRALAX) 17 GM/SCOOP powder DISSOLVE 17 GRAMS IN 8 OZ OF FLUID LIQUID DRINK DAILY AS DIRECTED Patient taking differently: Take 17 g by mouth daily.  06/09/20  Yes Arnetha Courser, MD  vitamin B-12 1000 MCG tablet Take 1 tablet (1,000 mcg total) by mouth daily. 06/10/20  Yes Arnetha Courser, MD     Allergies Sulfa antibiotics, Acetazolamide, and Hydrochlorothiazide   Family History  Problem Relation Age of Onset  . Heart attack Father   . Diabetes Sister   . Diabetes Brother     Social History Social History   Tobacco Use  . Smoking status: Former Smoker    Quit date: 11/08/2002    Years since quitting: 17.6  . Smokeless tobacco: Former Clinical biochemist  . Vaping Use: Never used  Substance Use Topics  . Alcohol use: Not Currently  . Drug use: Never    Review of Systems Level 5 Caveat: Portions of the History and Physical including HPI and review of systems are unable to be completely obtained due to patient being a poor historian   Constitutional:   No known fever.  ENT:   No rhinorrhea. Cardiovascular:   No chest pain or syncope. Respiratory:   No dyspnea or  cough. Gastrointestinal:   Negative for abdominal pain and vomiting.  Positive for diarrhea Musculoskeletal:   Negative for focal pain or swelling ____________________________________________   PHYSICAL EXAM:  VITAL SIGNS: ED Triage Vitals [06/23/20 1226]  Enc Vitals Group     BP      Pulse      Resp      Temp      Temp src      SpO2      Weight 120 lb 8 oz (54.7 kg)     Height  (1.6 m)     Head Circumference      Peak Flow      Pain Score 0     Pain Loc      Pain Edu?      Excl. in GC?     Vital signs reviewed, nursing assessments reviewed.   Constitutional:   Alert and oriented to self. Non-toxic appearance. Eyes:   Conjunctivae are  normal. EOMI. PERRL. ENT      Head:   Normocephalic and atraumatic.      Nose:   No congestion/rhinnorhea.       Mouth/Throat:   MMM, no pharyngeal erythema. No peritonsillar mass.       Neck:   No meningismus. Full ROM. Hematological/Lymphatic/Immunilogical:   No cervical lymphadenopathy. Cardiovascular:   RRR. Symmetric bilateral radial and DP pulses.  No murmurs. Cap refill less than 2 seconds. Respiratory:   Normal respiratory effort without tachypnea/retractions. Breath sounds are clear and equal bilaterally. No wheezes/rales/rhonchi. Gastrointestinal:   Soft and nontender. Non distended. There is no CVA tenderness.  No rebound, rigidity, or guarding. Musculoskeletal:   Normal range of motion in all extremities. No joint effusions.  No lower extremity tenderness.  No edema. Neurologic:   Normal speech and language.  Motor grossly intact. No acute focal neurologic deficits are appreciated.  Skin:    Skin is warm, dry and intact. No rash noted.  No petechiae, purpura, or bullae.  ____________________________________________    LABS (pertinent positives/negatives) (all labs ordered are listed, but only abnormal results are displayed) Labs Reviewed  COMPREHENSIVE METABOLIC PANEL - Abnormal; Notable for the following  components:      Result Value   Potassium 2.4 (*)    Chloride 91 (*)    Calcium 8.2 (*)    Total Protein 6.0 (*)    Albumin 2.7 (*)    Total Bilirubin 1.9 (*)    All other components within normal limits  CBC - Abnormal; Notable for the following components:   RBC 3.26 (*)    Hemoglobin 9.9 (*)    HCT 27.6 (*)    RDW 15.9 (*)    Platelets 127 (*)    nRBC 0.3 (*)    All other components within normal limits  URINALYSIS, COMPLETE (UACMP) WITH MICROSCOPIC - Abnormal; Notable for the following components:   Color, Urine AMBER (*)    APPearance CLEAR (*)    Ketones, ur 5 (*)    Protein, ur 30 (*)    Leukocytes,Ua TRACE (*)    Bacteria, UA RARE (*)    All other components within normal limits  GLUCOSE, CAPILLARY - Abnormal; Notable for the following components:   Glucose-Capillary 65 (*)    All other components within normal limits  MAGNESIUM - Abnormal; Notable for the following components:   Magnesium 1.2 (*)    All other components within normal limits  BASIC METABOLIC PANEL - Abnormal; Notable for the following components:   Sodium 133 (*)    Potassium 3.4 (*)    Chloride 92 (*)    Calcium 7.7 (*)    All other components within normal limits  CBC - Abnormal; Notable for the following components:   RBC 3.03 (*)    Hemoglobin 9.0 (*)    HCT 26.0 (*)    RDW 15.7 (*)    Platelets 108 (*)    nRBC 0.4 (*)    All other components within normal limits  MAGNESIUM - Abnormal; Notable for the following components:   Magnesium 1.2 (*)    All other components within normal limits  PHOSPHORUS - Abnormal; Notable for the following components:   Phosphorus 1.9 (*)    All other components within normal limits  SARS CORONAVIRUS 2 BY RT PCR (HOSPITAL ORDER, PERFORMED IN  HOSPITAL LAB)  GASTROINTESTINAL PANEL BY PCR, STOOL (REPLACES STOOL CULTURE)  C DIFFICILE QUICK SCREEN W PCR REFLEX  LIPASE, BLOOD  GLUCOSE,  CAPILLARY    ____________________________________________   EKG    ____________________________________________    RADIOLOGY  No results found.  ____________________________________________   PROCEDURES Procedures  ____________________________________________  DIFFERENTIAL DIAGNOSIS   Dehydration, electrolyte abnormality, infectious diarrhea, UTI  CLINICAL IMPRESSION / ASSESSMENT AND PLAN / ED COURSE  Medications ordered in the ED: Medications  potassium chloride (KLOR-CON) packet 40 mEq (40 mEq Oral Refused 06/23/20 1905)  lactated ringers infusion ( Intravenous New Bag/Given 06/24/20 0722)  enoxaparin (LOVENOX) injection 40 mg (40 mg Subcutaneous Given 06/23/20 2255)  pantoprazole (PROTONIX) injection 40 mg (40 mg Intravenous Given 06/23/20 2255)  atorvastatin (LIPITOR) tablet 40 mg (40 mg Oral Given 06/23/20 2255)  alum & mag hydroxide-simeth (MAALOX/MYLANTA) 200-200-20 MG/5ML suspension 15 mL (has no administration in time range)  docusate sodium (COLACE) capsule 100 mg (has no administration in time range)  vitamin B-12 (CYANOCOBALAMIN) tablet 1,000 mcg (1,000 mcg Oral Given 06/24/20 0912)  cholecalciferol (VITAMIN D3) tablet 1,000 Units (1,000 Units Oral Given 06/24/20 0911)  folic acid injection 1 mg (1 mg Intravenous Given 06/24/20 1136)  magnesium sulfate IVPB 4 g 100 mL (has no administration in time range)  sodium phosphate 20 mmol in dextrose 5 % 250 mL infusion (has no administration in time range)  lipase/protease/amylase (CREON) capsule 24,000 Units (24,000 Units Oral Given 06/24/20 1133)  ascorbic acid (VITAMIN C) tablet 500 mg (has no administration in time range)  feeding supplement (BOOST / RESOURCE BREEZE) liquid 1 Container (has no administration in time range)  multivitamin with minerals tablet 1 tablet (has no administration in time range)  lactated ringers bolus 1,000 mL (0 mLs Intravenous Stopped 06/23/20 1641)  ondansetron (ZOFRAN) injection 4 mg (4 mg  Intravenous Given 06/23/20 1429)  potassium chloride 10 mEq in 100 mL IVPB (0 mEq Intravenous Stopped 06/23/20 1842)  potassium chloride 10 mEq in 100 mL IVPB (0 mEq Intravenous Stopped 06/24/20 0044)  potassium chloride 10 mEq in 100 mL IVPB (10 mEq Intravenous New Bag/Given 06/24/20 1354)  cyanocobalamin ((VITAMIN B-12)) injection 1,000 mcg (1,000 mcg Intramuscular Given 06/24/20 1139)    Pertinent labs & imaging results that were available during my care of the patient were reviewed by me and considered in my medical decision making (see chart for details).   Kristen Richard was evaluated in Emergency Department on 06/24/2020 for the symptoms described in the history of present illness. She was evaluated in the context of the global COVID-19 pandemic, which necessitated consideration that the patient might be at risk for infection with the SARS-CoV-2 virus that causes COVID-19. Institutional protocols and algorithms that pertain to the evaluation of patients at risk for COVID-19 are in a state of rapid change based on information released by regulatory bodies including the CDC and federal and state organizations. These policies and algorithms were followed during the patient's care in the ED.   Patient sent to ED due to low energy, incontinence, diarrhea.  She is nontoxic and not in distress on exam, denies any acute symptoms.  Labs show potassium of 2.4, chronic anemia which is mild, otherwise unremarkable.  We will attempt to obtain a stool GI panel, Covid test.  Obtain pelvic x-ray due to bilateral hip pain, give IV fluids, potassium replacement, Zofran and reassess.  Clinical Course as of Jun 24 1528  Mon Jun 23, 2020  1456 X-ray pelvis negative for fractures   [PS]  1518 Attempted to call family at daughter cell # and home phone # listed on chart, no answer on either.   [  PS]    Clinical Course User Index [PS] Sharman CheekStafford, Nakeia Calvi, MD     ____________________________________________   FINAL  CLINICAL IMPRESSION(S) / ED DIAGNOSES    Final diagnoses:  Weakness  Hypokalemia  Dehydration     ED Discharge Orders    None      Portions of this note were generated with dragon dictation software. Dictation errors may occur despite best attempts at proofreading.   Sharman CheekStafford, Elvis Laufer, MD 06/24/20 1529

## 2020-06-23 NOTE — ED Triage Notes (Signed)
Pt here via EMS with c/o failure to thrive. Family reported to EMS that pt has not been eating an drinking, pt has had diarrhea and unable to control bladder and bowels.  Pt denies any complaints.

## 2020-06-23 NOTE — ED Notes (Signed)
Pt given crackers and juice

## 2020-06-23 NOTE — H&P (Signed)
History and Physical    Kristen Lericheaomi Jaskowiak ZOX:[id number] DOB: 10/23/1936 DOA: 06/23/2020  PCP: [facility]  Patient coming from: Home, lives with niece  I have personally briefly reviewed patient's old medical records in [facility]  Chief Complaint: Weakness and confusion  HPI: Kristen Richard is a 84 y.o. female with medical history significant for History of persistent atrial fibrillation not on anticoagulation due to frequent falls, history of sinus pause from beta-blocker, hypertension, lower extremity edema who presents with worsening weakness and confusion.  Patient unable to provide history given altered mental status.  History is obtained by daughter who is her healthcare power of attorney over the phone. Patient was recently hospitalized from 7/26-8/2 for nausea, vomiting diarrhea abdominal pain.  She underwent EGD on 06/06/2020 with some concern of nonspecific/eosinophilic esophagitis and also noted to have mild stenosis of gastric esophageal sphincter which was dilated.  She was also noted to have multiple electrolyte abnormalities secondary to GI losses and poor p.o. intake.  Daughter reports that since discharge from that admission patient has continued to decline.  She continues to have poor p.o. intake and with pretty much have nausea and vomiting with everything that she ate.  For the last 2 days has been having trouble ambulating by herself and could barely hold her head up.  She also did not allow anyone to touch her.  Today she also did not even recognize any of her family member.  She also had an episode of diarrhea several days ago.  Denies any fever.  She was brought to the ED after she was evaluated by home health nurse and was found to be having decreased urine output for the past 2 days. Patient currently lives with her niece but is in transition to move to her daughters house since her daughter will have someone monitor her during the day.  Daughter is open to  having her be put into a SNF but states that patient does not have Medicare and they cannot afford it.  She says that prior to her last admission patient was alert and oriented and did not have any signs of dementia.  ED Course: Patient was afebrile, normotensive on room air. CBC showed no leukocytosis, normocytic anemia stable with hemoglobin of 9.9.  She has mild thrombocytopenia of 127. Potassium of 2.4, normal LFTs, mildly elevated total bilirubin of 1.9.  Negative PCR Covid test. Pelvic x-ray was obtained since patient complained of hip pain to ED physician but this was negative.  Review of Systems: Unable to obtain given altered mental status  Past Medical History:  Diagnosis Date  . DDD (degenerative disc disease), lumbar   . Fall    RISK  . GERD (gastroesophageal reflux disease)   . 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 Hypertension   . Intermittent atrial fibrillation (HCC)   . Scoliosis   . Vertigo     Past Surgical History:  Procedure Laterality Date  . CATARACT EXTRACTION W/PHACO Left 12/28/2018   Procedure: CATARACT EXTRACTION PHACO AND INTRAOCULAR LENS PLACEMENT (IOC) LEFT;  Surgeon: Elliot CousinHarrow, Brian, MD;  Location: [facility] ORS;  Service: Ophthalmology;  Laterality: Left;  US   01:35 CDE 15.57 Fluid pack lot # [id number]  . ESOPHAGOGASTRODUODENOSCOPY N/A 06/07/2020   Procedure: ESOPHAGOGASTRODUODENOSCOPY (EGD);  Surgeon: Toledo, Boykin Nearingeodoro K, MD;  Location: [facility] ENDOSCOPY;  Service: Gastroenterology;  Laterality: N/A;  . ESOPHAGOGASTRODUODENOSCOPY (EGD) WITH PROPOFOL N/A 12/27/2019   Procedure: ESOPHAGOGASTRODUODENOSCOPY (EGD) WITH PROPOFOL;  Surgeon: Wyline MoodAnna, Kiran, MD;  Location: [facility] ENDOSCOPY;  Service: Gastroenterology;  Laterality: N/A;  . TUBAL LIGATION       reports that she quit smoking about 17 years ago. She has quit using smokeless tobacco. She reports previous alcohol use. She reports that she does not use drugs. Social History  Allergies  Allergen  Reactions  . Sulfa Antibiotics Swelling    Pt was told it caused her brain to swell - unc documented in 2014, pt is unaware of this   . Acetazolamide Nausea And Vomiting    Dizziness   . Hydrochlorothiazide     Family History  Problem Relation Age of Onset  . Heart attack Father   . Diabetes Sister   . Diabetes Brother      Prior to Admission medications   Medication Sig Start Date End Date Taking? Authorizing Provider  acetaminophen (TYLENOL) 325 MG tablet Take 325-650 mg by mouth every 6 (six) hours as needed for moderate pain or headache.    Yes [provider]  alum & mag hydroxide-simeth (MAALOX/MYLANTA) 200-200-20 MG/5ML suspension Take 15 mLs by mouth every 6 (six) hours as needed for indigestion or heartburn. 06/09/20  Yes Arnetha Courser, MD  atorvastatin (LIPITOR) 40 MG tablet Take 40 mg by mouth at bedtime.  01/19/20  Yes [provider]  bimatoprost (LUMIGAN) 0.03 % ophthalmic solution Place 1 drop into both eyes daily.   Yes [provider]  cholecalciferol (VITAMIN D3) 25 MCG (1000 UNIT) tablet Take 1,000 Units by mouth daily. 12/10/19  Yes [provider]  docusate sodium (COLACE) 100 MG capsule Take 1 capsule (100 mg total) by mouth 2 (two) times daily as needed for mild constipation or moderate constipation. 06/09/20  Yes Arnetha Courser, MD  folic acid (FOLVITE) 1 MG tablet Take 1 tablet (1 mg total) by mouth daily. 06/10/20  Yes Arnetha Courser, MD  furosemide (LASIX) 20 MG tablet Take 20 mg by mouth daily as needed for fluid or edema.  05/01/20  Yes [provider]  magnesium oxide (MAG-OX) 400 (241.3 Mg) MG tablet Take 2 tablets (800 mg total) by mouth daily. 01/01/20  Yes Lynn Ito, MD  Multiple Vitamin (MULTIVITAMIN WITH MINERALS) TABS tablet Take 1 tablet by mouth daily. 01/01/20  Yes Lynn Ito, MD  ondansetron (ZOFRAN-ODT) 4 MG disintegrating tablet Take 1 tablet (4 mg total) by mouth every 8 (eight) hours as needed for nausea or  vomiting. 06/09/20  Yes Arnetha Courser, MD  pantoprazole (PROTONIX) 40 MG tablet Take 1 tablet (40 mg total) by mouth daily. 01/01/20  Yes Lynn Ito, MD  phosphorus (K PHOS NEUTRAL) 155-852-130 MG tablet Take 2 tablets (500 mg total) by mouth 2 (two) times daily. 01/01/20  Yes Lynn Ito, MD  polyethylene glycol powder (GLYCOLAX/MIRALAX) 17 GM/SCOOP powder DISSOLVE 17 GRAMS IN 8 OZ OF FLUID LIQUID DRINK DAILY AS DIRECTED Patient taking differently: Take 17 g by mouth daily.  06/09/20  Yes Arnetha Courser, MD  vitamin B-12 1000 MCG tablet Take 1 tablet (1,000 mcg total) by mouth daily. 06/10/20  Adele Schilder, MD    Physical Exam: Vitals:   06/23/20 1930 06/23/20 2000 06/23/20 2030 06/23/20 2034  BP: 140/70 (!) 145/74 (!) 150/75   Pulse:    85  Resp: 11 10 13    Temp:    97.8 F (36.6 C)  TempSrc:    Oral  SpO2:    97%  Weight:      Height:        Constitutional: NAD, calm,  comfortable, elderly female laying in bed with her right hand propping up her head in a dazed and staring into the distance Vitals:   06/23/20 1930 06/23/20 2000 06/23/20 2030 06/23/20 2034  BP: 140/70 (!) 145/74 (!) 150/75   Pulse:    85  Resp: 11 10 13    Temp:    97.8 F (36.6 C)  TempSrc:    Oral  SpO2:    97%  Weight:      Height:       Eyes: PERRL, lids and conjunctivae normal ENMT: Mucous membranes are moist. Neck: normal, supple Respiratory: clear to auscultation bilaterally, no wheezing, no crackles. Normal respiratory effort. No accessory muscle use.  Cardiovascular: Regular rate and rhythm, no murmurs / rubs / gallops.  Nonpitting edema bilateral extremity with right worse than left-chronic according to daughter  abdomen: no tenderness, no masses palpated.  Bowel sounds positive.  Musculoskeletal: no clubbing / cyanosis. No joint deformity upper and lower extremities.   Skin: Diffuse bruising noted to upper and lower extremity Neurologic: Patient initially asleep but woke easily to voice.  She was  able to answer some questions but other times would not reply.  Appeared dazed and would stare off into the distance.  She would say yes when asked to move her extremities but would not actually move them.  Denied pain with entire exam. Psychiatric: Alert but disoriented.   Labs on Admission: I have personally reviewed following labs and imaging studies  CBC: Recent Labs  Lab 06/23/20 1230  WBC 6.9  HGB 9.9*  HCT 27.6*  MCV 84.7  PLT 127*   Basic Metabolic Panel: Recent Labs  Lab 06/23/20 1230  NA 135  K 2.4*  CL 91*  CO2 29  GLUCOSE 82  BUN 22  CREATININE 0.69  CALCIUM 8.2*  MG 1.2*   GFR: Estimated Creatinine Clearance: 45.6 mL/min (by C-G formula based on SCr of 0.69 mg/dL). Liver Function Tests: Recent Labs  Lab 06/23/20 1230  AST 23  ALT 15  ALKPHOS 67  BILITOT 1.9*  PROT 6.0*  ALBUMIN 2.7*   Recent Labs  Lab 06/23/20 1230  LIPASE 23   No results for input(s): AMMONIA in the last 168 hours. Coagulation Profile: No results for input(s): INR, PROTIME in the last 168 hours. Cardiac Enzymes: No results for input(s): CKTOTAL, CKMB, CKMBINDEX, TROPONINI in the last 168 hours. BNP (last 3 results) No results for input(s): PROBNP in the last 8760 hours. HbA1C: No results for input(s): HGBA1C in the last 72 hours. CBG: Recent Labs  Lab 06/23/20 1227 06/23/20 1607  GLUCAP 65* 84   Lipid Profile: No results for input(s): CHOL, HDL, LDLCALC, TRIG, CHOLHDL, LDLDIRECT in the last 72 hours. Thyroid Function Tests: No results for input(s): TSH, T4TOTAL, FREET4, T3FREE, THYROIDAB in the last 72 hours. Anemia Panel: No results for input(s): VITAMINB12, FOLATE, FERRITIN, TIBC, IRON, RETICCTPCT in the last 72 hours. Urine analysis:    Component Value Date/Time   COLORURINE AMBER (A) 06/02/2020 1549   APPEARANCEUR HAZY (A) 06/02/2020 1549   LABSPEC 1.018 06/02/2020 1549   PHURINE 6.0 06/02/2020 1549   GLUCOSEU 50 (A) 06/02/2020 1549   HGBUR SMALL (A)  06/02/2020 1549   BILIRUBINUR NEGATIVE 06/02/2020 1549   KETONESUR 20 (A) 06/02/2020 1549   PROTEINUR NEGATIVE 06/02/2020 1549   NITRITE NEGATIVE 06/02/2020 1549   LEUKOCYTESUR NEGATIVE 06/02/2020 1549    Radiological Exams on Admission: DG Pelvis Portable  Result Date: 06/23/2020 CLINICAL DATA:  BILATERAL hip pain  EXAM: PORTABLE PELVIS 1-2 VIEWS COMPARISON:  None FINDINGS: Osseous demineralization. Hip and SI joint spaces preserved. Artifacts from retained barium project over the pelvis. No acute fracture, dislocation, or bone destruction. Mild degenerative disc and facet disease changes at visualized lumbar spine. IMPRESSION: Osseous demineralization. No acute abnormalities. Electronically Signed   By: Ulyses Southward M.D.   On: 06/23/2020 14:22      Assessment/Plan  Acute metabolic encephalopathy Confusion likely secondary to dehydration from GI symptoms and lack of p.o. intake Placed on continuous IV fluids 100 cc/h a.m. monitor clinically  Failure to thrive/weakness Patient was previously hospitalized and discharged 2 weeks ago.  She was noted to have reflux esophagitis and also underwent dilation of esophageal sphincter. Give IV PPI Dietitian consult PT consult  Hypokalemia will replete with IV potassium  Mild thrombocytopenia Unclear etiology.  Will monitor repeat CBC in the morning  Persistent atrial fibrillation Rate controlled-patient unable to be on beta-blocker due to history of sinus pauses Not on anticoagulation due to recurrent falls  Hypertension stable   Anemia secondary to Vitamin B12/folate deficiency Continue supplements  DVT prophylaxis:.Lovenox Code Status: DNR -confirmed with daughter Family Communication: Plan discussed with daughter [name] who is  HCPOA over the phone disposition Plan: Home with observation Consults called:  Admission status: Observation  Status is: Observation  The patient remains OBS appropriate and will d/c before 2  midnights.  Dispo: The patient is from: Home              Anticipated d/c is to: Home              Anticipated d/c date is: 1 day              Patient currently is not medically stable to d/c.         Anselm Jungling DO [facility]   If 7PM-7AM, please contact night-coverage [email or web address]   06/23/2020, 8:58 PM

## 2020-06-23 NOTE — ED Triage Notes (Signed)
First nurse note: Patient to ER from home via [facility] for c/o steady decline over last couple of months (failure to thrive). Per family to EMS, patient is unable to control bladder/bowel, doesn't get up to eat or drink. + Diarrhea . Vitals per EMS: 119/59, 100% O2 on RA, 83HR. CBG by fire department was 64.

## 2020-06-23 NOTE — ED Notes (Signed)
Patient with episode of vomiting after administration of odt zofran. MD aware. Order changed to IV.

## 2020-06-24 ENCOUNTER — Encounter: Payer: Self-pay | Admitting: Family Medicine

## 2020-06-24 DIAGNOSIS — Z66 Do not resuscitate: Secondary | ICD-10-CM | POA: Diagnosis present

## 2020-06-24 DIAGNOSIS — I4821 Permanent atrial fibrillation: Secondary | ICD-10-CM | POA: Diagnosis present

## 2020-06-24 DIAGNOSIS — R627 Adult failure to thrive: Secondary | ICD-10-CM | POA: Diagnosis present

## 2020-06-24 DIAGNOSIS — K909 Intestinal malabsorption, unspecified: Secondary | ICD-10-CM | POA: Diagnosis present

## 2020-06-24 DIAGNOSIS — E43 Unspecified severe protein-calorie malnutrition: Secondary | ICD-10-CM | POA: Insufficient documentation

## 2020-06-24 DIAGNOSIS — R638 Other symptoms and signs concerning food and fluid intake: Secondary | ICD-10-CM | POA: Diagnosis not present

## 2020-06-24 DIAGNOSIS — E876 Hypokalemia: Secondary | ICD-10-CM | POA: Diagnosis present

## 2020-06-24 DIAGNOSIS — E785 Hyperlipidemia, unspecified: Secondary | ICD-10-CM | POA: Diagnosis present

## 2020-06-24 DIAGNOSIS — R197 Diarrhea, unspecified: Secondary | ICD-10-CM | POA: Diagnosis present

## 2020-06-24 DIAGNOSIS — D519 Vitamin B12 deficiency anemia, unspecified: Secondary | ICD-10-CM | POA: Diagnosis present

## 2020-06-24 DIAGNOSIS — D696 Thrombocytopenia, unspecified: Secondary | ICD-10-CM | POA: Diagnosis present

## 2020-06-24 DIAGNOSIS — K8689 Other specified diseases of pancreas: Secondary | ICD-10-CM | POA: Diagnosis present

## 2020-06-24 DIAGNOSIS — E860 Dehydration: Secondary | ICD-10-CM | POA: Diagnosis present

## 2020-06-24 DIAGNOSIS — K2100 Gastro-esophageal reflux disease with esophagitis, without bleeding: Secondary | ICD-10-CM | POA: Diagnosis present

## 2020-06-24 DIAGNOSIS — N182 Chronic kidney disease, stage 2 (mild): Secondary | ICD-10-CM | POA: Diagnosis present

## 2020-06-24 DIAGNOSIS — I4819 Other persistent atrial fibrillation: Secondary | ICD-10-CM | POA: Diagnosis not present

## 2020-06-24 DIAGNOSIS — R531 Weakness: Secondary | ICD-10-CM | POA: Diagnosis not present

## 2020-06-24 DIAGNOSIS — L8990 Pressure ulcer of unspecified site, unspecified stage: Secondary | ICD-10-CM | POA: Diagnosis present

## 2020-06-24 DIAGNOSIS — R338 Other retention of urine: Secondary | ICD-10-CM | POA: Diagnosis not present

## 2020-06-24 DIAGNOSIS — I129 Hypertensive chronic kidney disease with stage 1 through stage 4 chronic kidney disease, or unspecified chronic kidney disease: Secondary | ICD-10-CM | POA: Diagnosis present

## 2020-06-24 DIAGNOSIS — E162 Hypoglycemia, unspecified: Secondary | ICD-10-CM | POA: Diagnosis present

## 2020-06-24 DIAGNOSIS — R296 Repeated falls: Secondary | ICD-10-CM | POA: Diagnosis present

## 2020-06-24 DIAGNOSIS — Z515 Encounter for palliative care: Secondary | ICD-10-CM | POA: Diagnosis present

## 2020-06-24 DIAGNOSIS — R339 Retention of urine, unspecified: Secondary | ICD-10-CM | POA: Diagnosis present

## 2020-06-24 DIAGNOSIS — Z20822 Contact with and (suspected) exposure to covid-19: Secondary | ICD-10-CM | POA: Diagnosis present

## 2020-06-24 DIAGNOSIS — G9341 Metabolic encephalopathy: Secondary | ICD-10-CM | POA: Diagnosis present

## 2020-06-24 LAB — BASIC METABOLIC PANEL
Anion gap: 11 (ref 5–15)
BUN: 17 mg/dL (ref 8–23)
CO2: 30 mmol/L (ref 22–32)
Calcium: 7.7 mg/dL — ABNORMAL LOW (ref 8.9–10.3)
Chloride: 92 mmol/L — ABNORMAL LOW (ref 98–111)
Creatinine, Ser: 0.61 mg/dL (ref 0.44–1.00)
GFR calc Af Amer: >60 mL/min (ref >60)
GFR calc non Af Amer: >60 mL/min (ref >60)
Glucose, Bld: 72 mg/dL (ref 70–99)
Potassium: 3.4 mmol/L — ABNORMAL LOW (ref 3.5–5.1)
Sodium: 133 mmol/L — ABNORMAL LOW (ref 135–145)

## 2020-06-24 LAB — CBC
HCT: 26 % — ABNORMAL LOW (ref 36.0–46.0)
Hemoglobin: 9 g/dL — ABNORMAL LOW (ref 12.0–15.0)
MCH: 29.7 pg (ref 26.0–34.0)
MCHC: 34.6 g/dL (ref 30.0–36.0)
MCV: 85.8 fL (ref 80.0–100.0)
Platelets: 108 10*3/uL — ABNORMAL LOW (ref 150–400)
RBC: 3.03 MIL/uL — ABNORMAL LOW (ref 3.87–5.11)
RDW: 15.7 % — ABNORMAL HIGH (ref 11.5–15.5)
WBC: 5.3 10*3/uL (ref 4.0–10.5)
nRBC: 0.4 % — ABNORMAL HIGH (ref 0.0–0.2)

## 2020-06-24 LAB — URINALYSIS, COMPLETE (UACMP) WITH MICROSCOPIC
Bilirubin Urine: NEGATIVE
Glucose, UA: NEGATIVE mg/dL
Hgb urine dipstick: NEGATIVE
Ketones, ur: 5 mg/dL — AB
Nitrite: NEGATIVE
Protein, ur: 30 mg/dL — AB
Specific Gravity, Urine: 1.024 (ref 1.005–1.030)
Squamous Epithelial / HPF: NONE SEEN (ref 0–5)
pH: 5 (ref 5.0–8.0)

## 2020-06-24 LAB — MAGNESIUM: Magnesium: 1.2 mg/dL — ABNORMAL LOW (ref 1.7–2.4)

## 2020-06-24 LAB — PHOSPHORUS: Phosphorus: 1.9 mg/dL — ABNORMAL LOW (ref 2.5–4.6)

## 2020-06-24 MED ORDER — BOOST / RESOURCE BREEZE PO LIQD CUSTOM: 1.0000 | Freq: Three times a day (TID) | ORAL | Status: DC

## 2020-06-24 MED ORDER — PANCRELIPASE (LIP-PROT-AMYL) 12000-38000 UNITS PO CPEP
24000.0000 [IU] | ORAL_CAPSULE | Freq: Three times a day (TID) | ORAL | Status: DC
  Administered 2020-06-24: 24000 [IU] via ORAL
  Filled 2020-06-24 (×25): qty 2

## 2020-06-24 MED ORDER — MAGNESIUM SULFATE 4 GM/100ML IV SOLN
4.0000 g | Freq: Once | INTRAVENOUS | Status: AC
  Administered 2020-06-24: 4 g via INTRAVENOUS
  Filled 2020-06-24: qty 100

## 2020-06-24 MED ORDER — ASCORBIC ACID 500 MG PO TABS
500.0000 mg | ORAL_TABLET | Freq: Two times a day (BID) | ORAL | Status: DC
  Administered 2020-06-24: 500 mg via ORAL
  Filled 2020-06-24 (×3): qty 1

## 2020-06-24 MED ORDER — ADULT MULTIVITAMIN W/MINERALS CH
1.0000 | ORAL_TABLET | Freq: Every day | ORAL | Status: DC
  Filled 2020-06-24 (×2): qty 1

## 2020-06-24 MED ORDER — CYANOCOBALAMIN 1000 MCG/ML IJ SOLN
1000.0000 ug | Freq: Once | INTRAMUSCULAR | Status: AC
  Administered 2020-06-24: 1000 ug via INTRAMUSCULAR
  Filled 2020-06-24: qty 1

## 2020-06-24 MED ORDER — POTASSIUM CHLORIDE 10 MEQ/100ML IV SOLN
10.0000 meq | INTRAVENOUS | Status: AC
  Administered 2020-06-24 (×2): 10 meq via INTRAVENOUS
  Filled 2020-06-24 (×2): qty 100

## 2020-06-24 MED ORDER — SODIUM PHOSPHATES 45 MMOLE/15ML IV SOLN
20.0000 mmol | Freq: Once | INTRAVENOUS | Status: AC
  Administered 2020-06-24: 20 mmol via INTRAVENOUS
  Filled 2020-06-24: qty 6.67

## 2020-06-24 MED ORDER — FOLIC ACID 5 MG/ML IJ SOLN
1.0000 mg | Freq: Every day | INTRAMUSCULAR | Status: DC
  Administered 2020-06-24 – 2020-06-26 (×3): 1 mg via INTRAVENOUS
  Filled 2020-06-24 (×4): qty 0.2

## 2020-06-24 NOTE — Progress Notes (Signed)
[facility] Liaison note:  Patient has a previous referral for [employer] community Palliative program from last admission. Appointment has been arranged for 8/20 at 2 pm. TOC Bevelyn Ngo made aware. Will continue to follow for disposition. Thank you. Dayna Barker BSN, RN, [facility] [employer] [phone]

## 2020-06-24 NOTE — Progress Notes (Addendum)
PROGRESS NOTE    Kristen Richard  BEE:[id number] DOB: 05-Jun-1936 DOA: 06/23/2020 PCP: [facility]   Chief complaint.  Weakness and confusion  Brief Narrative: Kristen Richard is a 84 y.o. female with medical history significant for History of persistent atrial fibrillation not on anticoagulation due to frequent falls, history of sinus pause from beta-blocker, hypertension, lower extremity edema who presents with worsening weakness and confusion. Patient had a significant nausea vomiting, occasional diarrhea, poor appetite.  8/17.  Patient condition is consistent with malabsorption probably from pancreatic insufficiency.  Patient has significant J88 deficiency, folic acid deficiency, chronic diarrhea.  Started Creon.  Giving T25 and folic acid injection.  Supplement potassium, phosphorus and magnesium.   Assessment & Plan:   Active Problems:   Hypertension   Hypokalemia   Weakness   Acute metabolic encephalopathy   Failure to thrive in adult   Persistent atrial fibrillation (HCC)   Anemia   Thrombocytopenia ([place])  #1.  Failure to thrive. Patient has significant nausea vomiting and diarrhea.  She has a poor appetite and weight loss.  She has significant malabsorption.  Started Creon.  Patient will need outpatient EGD to rule out GI pathology including malignancy and celiac disease. Continue PT and OT.  #2.  Hypokalemia, hypomagnesemia, hypophosphatemia. Supplement through IV.  3.  Anemia due to B12 deficiency and folic acid deficiency. Most likely secondary to malabsorption.  Change oral supplements to injection.  4.  Acute metabolic encephalopathy. Patient may also be developing some dementia.  Continue to follow.  5.  Mild thrombocytopenia. Follow.  6.  Persistent atrial fibrillation. Not currently on anticoagulation.  Rate under control.  7.  Essential hypertension. Condition stable.  8.  Urinary retention. Bladder scan with residual more than 500.  We will  start in and out cath and schedule bladder scan.     DVT prophylaxis: Lovenox Code Status: DNR Family Communication: None Disposition Plan:   Patient came from:             Anticipated d/c place:  Barriers to d/c OR conditions which need to be met to effect a safe d/c:   Consultants:   None  Procedures: None Antimicrobials: None  Subjective: Patient is very confused, but no agitation.  Denies any short of breath or cough, still has some diarrhea, but no additional nausea vomiting.  No abdominal pain. No fever or chills.  Objective: Vitals:   06/24/20 0145 06/24/20 0220 06/24/20 0301 06/24/20 0651  BP:  (!) 144/71 (!) 145/83 130/61  Pulse:  81 92 86  Resp: '11 12 20   ' Temp:  98.2 F (36.8 C) (!) 97.4 F (36.3 C) 98.1 F (36.7 C)  TempSrc:  Oral Oral   SpO2:  95% 100% 100%  Weight:      Height:        Intake/Output Summary (Last 24 hours) at 06/24/2020 1007 Last data filed at 06/24/2020 0242 Gross per 24 hour  Intake 1262.1 ml  Output --  Net 1262.1 ml   Filed Weights   06/23/20 1226  Weight: 54.7 kg    Examination:  General exam: Appears calm and comfortable  Respiratory system: Clear to auscultation. Respiratory effort normal. Cardiovascular system: Irregular.  No JVD, murmurs, rubs, gallops or clicks. No pedal edema. Gastrointestinal system: Abdomen is nondistended, soft and nontender. No organomegaly or masses felt. Normal bowel sounds heard. Central nervous system: Confused and oriented to herself. No focal neurological deficits. Extremities: Symmetric  Skin: No rashes, lesions or ulcers Psychiatry:  Mood & affect appropriate.     Data Reviewed: I have personally reviewed following labs and imaging studies  CBC: Recent Labs  Lab 06/23/20 1230 06/24/20 0520  WBC 6.9 5.3  HGB 9.9* 9.0*  HCT 27.6* 26.0*  MCV 84.7 85.8  PLT 127* 950*   Basic Metabolic Panel: Recent Labs  Lab 06/23/20 1230 06/24/20 0520  NA 135 133*  K 2.4* 3.4*  CL  91* 92*  CO2 29 30  GLUCOSE 82 72  BUN 22 17  CREATININE 0.69 0.61  CALCIUM 8.2* 7.7*  MG 1.2* 1.2*  PHOS  --  1.9*   GFR: Estimated Creatinine Clearance: 45.6 mL/min (by C-G formula based on SCr of 0.61 mg/dL). Liver Function Tests: Recent Labs  Lab 06/23/20 1230  AST 23  ALT 15  ALKPHOS 67  BILITOT 1.9*  PROT 6.0*  ALBUMIN 2.7*   Recent Labs  Lab 06/23/20 1230  LIPASE 23   No results for input(s): AMMONIA in the last 168 hours. Coagulation Profile: No results for input(s): INR, PROTIME in the last 168 hours. Cardiac Enzymes: No results for input(s): CKTOTAL, CKMB, CKMBINDEX, TROPONINI in the last 168 hours. BNP (last 3 results) No results for input(s): PROBNP in the last 8760 hours. HbA1C: No results for input(s): HGBA1C in the last 72 hours. CBG: Recent Labs  Lab 06/23/20 1227 06/23/20 1607  GLUCAP 65* 84   Lipid Profile: No results for input(s): CHOL, HDL, LDLCALC, TRIG, CHOLHDL, LDLDIRECT in the last 72 hours. Thyroid Function Tests: No results for input(s): TSH, T4TOTAL, FREET4, T3FREE, THYROIDAB in the last 72 hours. Anemia Panel: No results for input(s): VITAMINB12, FOLATE, FERRITIN, TIBC, IRON, RETICCTPCT in the last 72 hours. Sepsis Labs: No results for input(s): PROCALCITON, LATICACIDVEN in the last 168 hours.  Recent Results (from the past 240 hour(s))  SARS Coronavirus 2 by RT PCR (hospital order, performed in [facility] hospital lab) Nasopharyngeal Nasopharyngeal Swab     Status: None   Collection Time: 06/23/20  2:55 PM   Specimen: Nasopharyngeal Swab  Result Value Ref Range Status   SARS Coronavirus 2 NEGATIVE NEGATIVE Final    Comment: (NOTE) SARS-CoV-2 target nucleic acids are NOT DETECTED.  The SARS-CoV-2 RNA is generally detectable in upper and lower respiratory specimens during the acute phase of infection. The lowest concentration of SARS-CoV-2 viral copies this assay can detect is 250 copies / mL. A negative result does not  preclude SARS-CoV-2 infection and should not be used as the sole basis for treatment or other patient management decisions.  A negative result may occur with improper specimen collection / handling, submission of specimen other than nasopharyngeal swab, presence of viral mutation(s) within the areas targeted by this assay, and inadequate number of viral copies (<250 copies / mL). A negative result must be combined with clinical observations, patient history, and epidemiological information.  Fact Sheet for Patients:   StrictlyIdeas.no  Fact Sheet for Healthcare Providers: BankingDealers.co.za  This test is not yet approved or  cleared by the [place] FDA and has been authorized for detection and/or diagnosis of SARS-CoV-2 by FDA under an Emergency Use Authorization (EUA).  This EUA will remain in effect (meaning this test can be used) for the duration of the COVID-19 declaration under Section 564(b)(1) of the Act, 21 U.S.C. section 360bbb-3(b)(1), unless the authorization is terminated or revoked sooner.  Performed at [facility] [address], [place], [place] [address]          Radiology Studies: [place]  Pelvis Portable  Result Date: 06/23/2020 CLINICAL DATA:  BILATERAL hip pain EXAM: PORTABLE PELVIS 1-2 VIEWS COMPARISON:  None FINDINGS: Osseous demineralization. Hip and SI joint spaces preserved. Artifacts from retained barium project over the pelvis. No acute fracture, dislocation, or bone destruction. Mild degenerative disc and facet disease changes at visualized lumbar spine. IMPRESSION: Osseous demineralization. No acute abnormalities. Electronically Signed   By: Lavonia Dana M.D.   On: 06/23/2020 14:22        Scheduled Meds:  atorvastatin  40 mg Oral QHS   cholecalciferol  1,000 Units Oral Daily   cyanocobalamin  1,000 mcg Intramuscular Once   enoxaparin (LOVENOX) injection  40 mg Subcutaneous X03Y    folic acid  1 mg Intravenous Daily   lipase/protease/amylase  24,000 Units Oral TID AC   pantoprazole (PROTONIX) IV  40 mg Intravenous QHS   potassium chloride  40 mEq Oral Once   cyanocobalamin  1,000 mcg Oral Daily   Continuous Infusions:  lactated ringers 100 mL/hr at 06/24/20 3338   magnesium sulfate bolus IVPB     sodium phosphate  Dextrose 5% IVPB       LOS: 0 days    Time spent: 40 minutes    Sharen Hones, MD [facility]   To contact the attending provider between 7A-7P or the covering provider during after hours 7P-7A, please log into the web site [email or web address] and access using universal  password for that web site. If you do not have the password, please call the hospital operator.  06/24/2020, 10:07 AM

## 2020-06-24 NOTE — Progress Notes (Signed)
Initial Nutrition Assessment  DOCUMENTATION CODES:   Severe malnutrition in context of social or environmental circumstances  INTERVENTION:   Boost Breeze po TID, each supplement provides 250 kcal and 9 grams of protein  Magic cup TID with meals, each supplement provides 290 kcal and 9 grams of protein  MVI daily  Vitamin C 551m po BID  Pt at high refeed risk; recommend monitor K, Mg and P labs daily until stable    NUTRITION DIAGNOSIS:   Severe Malnutrition related to social / environmental circumstances (FTT, chronic poor appetite, advanced age) as evidenced by moderate to severe fat depletions, moderate to severe muscle depletions, 38 percent weight loss in 1 year and 21 percent weight loss in 6 months.  GOAL:   Patient will meet greater than or equal to 90% of their needs  MONITOR:   PO intake, Supplement acceptance, Labs, Weight trends, Skin, I & O's  REASON FOR ASSESSMENT:   Consult Poor PO  ASSESSMENT:   84y.o. female with medical history significant for persistent atrial fibrillation not on anticoagulation due to frequent falls, esophageal dilation, history of sinus pause from beta-blocker, hypertension, lower extremity edema who presents with worsening weakness and confusion.  Met with pt in room today. Pt is known to this RD from previous admits. Pt with chronic poor appetite and oral intake. Pt does not like "milky" supplements and does not drink any supplements at home. Per chart, pt has lost 71lbs(38%) over the past year and 31lbs(21%) over the past 6 months; this is severe weight loss. Pt has developed malnutrition. Pt currently only eating bites of meals in hospital. Pt reports that she is willing to try Boost Breeze (peach). RD will add supplements and MVI to help pt meet her estimated needs. Pt with widespread ecchymosis. RD highly suspects scurvy r/t chronic poor oral intake; will add vitamin C. Pt also noted to have folate deficiency which is being  supplemented. Per MD note, there is some concern for malabsorption; pt initiated on creon. Pt is at high refeed risk; recommend monitor electrolytes.   Medications reviewed and include: D3, lovenox, folic acid, creon, protonix, KCl, B12, LRS '@100ml' /hr, Mg sulfate, Na Phos  Labs reviewed: Na 133(L), K 3.4(L), Cl 92(L), P 1.9(L), Mg 1.2(L) Folate- 1.3(L), B12 229 wnl- 8/1 Hgb 9.0(L), Hct 26.0(L)  NUTRITION - FOCUSED PHYSICAL EXAM:    Most Recent Value  Orbital Region Mild depletion  Upper Arm Region Moderate depletion  Thoracic and Lumbar Region Severe depletion  Buccal Region Moderate depletion  Temple Region Moderate depletion  Clavicle Bone Region Moderate depletion  Clavicle and Acromion Bone Region Moderate depletion  Scapular Bone Region Moderate depletion  Dorsal Hand Severe depletion  Patellar Region Moderate depletion  Anterior Thigh Region Moderate depletion  Posterior Calf Region Severe depletion  Edema (RD Assessment) Moderate  Hair Reviewed  Eyes Reviewed  Mouth Reviewed  Skin Reviewed  Nails Reviewed     Diet Order:   Diet Order            Diet regular Room service appropriate? Yes; Fluid consistency: Thin  Diet effective now                EDUCATION NEEDS:   Education needs have been addressed  Skin:  Skin Assessment: Reviewed RN Assessment (ecchymosis)  Last BM:  pta  Height:   Ht Readings from Last 1 Encounters:  06/23/20 '5\' 3"'  (1.6 m)    Weight:   Wt Readings from Last 1 Encounters:  06/23/20 54.7 kg    Ideal Body Weight:  52.3 kg  BMI:  Body mass index is 21.35 kg/m.  Estimated Nutritional Needs:   Kcal:  1400-1600kcal/day  Protein:  70-80g/day  Fluid:  >1.4L/day  Koleen Distance MS, RD, LDN Please refer to [facility] for RD and/or RD on-call/weekend/after hours pager

## 2020-06-24 NOTE — Evaluation (Signed)
Occupational Therapy Evaluation Patient Details Name: Kristen Richard MRN: [id number] DOB: December 11, 1935 Today's Date: 06/24/2020    History of Present Illness Kristen Richard is an 83yoF who comes to [facility] on 8/16 due to weakness adn AMS. PMH: AF off anticoagulation 2/2/ falls, BB, HTN, LEE. Recently hospitalized from 7/26-8/2 for nausea, vomiting diarrhea abdominal pain. Per chart review, pt performs mostly transfers in home with assist, manual WC in community.   Clinical Impression   Kristen Richard was seen for OT evaluation this date. Prior to hospital admission, pt was living at home c family assisting for ADLs and t/fs. Pt presents to acute OT demonstrating impaired ADL performance and functional mobility 2/2 decreased initiation, decreased safety awareness, functional strength/ROM deficits, and poor insight into deficits. Pt currently orietned to self only - when given categorical choice of "hospital or hotel" pt states location as hotel. When presented c comb pt unable to initiate grooming, ~1 min later asked if required assistance to comb pt pointed to window and stated "no he gave it to me." Pt refused all ADLs however noted to have functional grasp for self-feeding/drinking and grooming, however pt demonstrated lack of sequencing, command following, and initiation of tasks. Pt would benefit from skilled OT to address noted impairments and functional limitations (see below for any additional details) in order to maximize safety and independence while minimizing falls risk and caregiver burden. Upon hospital discharge, recommend HHOT to maximize pt safety and return to functional independence during meaningful occupations of daily life.     Follow Up Recommendations  Home health OT;Supervision/Assistance - 24 hour (Would beenfit from SNF however pt family consistently refuses in past)   Equipment Recommendations  None recommended by OT    Recommendations for Other Services       Precautions / Restrictions  Precautions Precautions: Fall Precaution Comments: Aspiration (from last week) Restrictions Weight Bearing Restrictions: No      Mobility Bed Mobility Overal bed mobility:  (pt refuses)     General bed mobility comments: Pt refused all mobility attempts  Transfers       Balance Overall balance assessment: History of Falls        ADL either performed or assessed with clinical judgement   ADL Overall ADL's : Needs assistance/impaired      General ADL Comments: Functional grasp for self-feeding/drinking and grooming, however pt demonstrated lack of sequencing, command following, and initiation of tasks. Pt refused all ADLs      Pertinent Vitals/Pain Pain Assessment: No/denies pain     Hand Dominance Right   Extremity/Trunk Assessment Upper Extremity Assessment Upper Extremity Assessment: Generalized weakness (BUE AROM ~90* )   Lower Extremity Assessment Lower Extremity Assessment: Difficult to assess due to impaired cognition       Communication Communication Communication: No difficulties   Cognition Arousal/Alertness: Awake/alert Behavior During Therapy: Restless;Agitated Overall Cognitive Status: Impaired/Different from baseline Area of Impairment: Orientation;Memory;Following commands;Awareness;Problem solving      Orientation Level: Disoriented to;Place;Time;Situation   Memory: Decreased short-term memory Following Commands: Follows one step commands inconsistently     Problem Solving: Slow processing;Decreased initiation;Difficulty sequencing;Requires verbal cues;Requires tactile cues General Comments: Orietned to self only, given categorical choice of "hospital or hotel" pt states location as hotel. When presented c comb pt unable to initiate grooming, ~1 min later asked if required assistance to comb pt pointed to window and stated "no he gave it to me"    General Comments  brusiing noted across BLE    Exercises Exercises: Other exercises  Other  Exercises Other Exercises: Pt re-oriented to place/situation. Attempted self-feeding/drinking, face washing, combing hair. Pt refused all interventions     Home Living Family/patient expects to be discharged to:: Private residence Living Arrangements: Other relatives (granddaughter) Available Help at Discharge: Family;Available 24 hours/day Type of Home: House Home Access: Ramped entrance     Home Layout: One level     Bathroom Shower/Tub: Tub only   Bathroom Toilet:  (uses BSC)     Home Equipment: Walker - 2 wheels;Bedside commode;Cane - single point;Wheelchair - manual   Additional Comments: Lift chair that turns into flat bed and also helps pt stand      Prior Functioning/Environment Level of Independence: Needs assistance  Gait / Transfers Assistance Needed: Pt's grand-daughter reporting pt needing assist at times to stand but then normally able to walk a few feet with RW recliner to/from [facility]; uses manual w/c in community ADL's / Homemaking Assistance Needed: Sponge bathes, assist for BADLs            OT Problem List: Decreased strength;Decreased activity tolerance;Decreased cognition;Decreased safety awareness      OT Treatment/Interventions: Self-care/ADL training;Therapeutic exercise;Energy conservation;DME and/or AE instruction;Therapeutic activities;Cognitive remediation/compensation;Patient/family education;Balance training    OT Goals(Current goals can be found in the care plan section) Acute Rehab OT Goals Patient Stated Goal: none stated OT Goal Formulation: Patient unable to participate in goal setting Time For Goal Achievement: 07/08/20 Potential to Achieve Goals: Good ADL Goals Pt Will Perform Eating: with set-up;with supervision;sitting Pt Will Perform Upper Body Bathing: with set-up;bed level (MIN VCs for sequencing and initiation) Pt Will Transfer to Toilet: with min assist (rolling at bed level)  OT Frequency: Min 1X/week    AM-PAC OT "6 Clicks"  Daily Activity     Outcome Measure Help from another person eating meals?: A Little Help from another person taking care of personal grooming?: A Little Help from another person toileting, which includes using toliet, bedpan, or urinal?: A Lot Help from another person bathing (including washing, rinsing, drying)?: A Lot Help from another person to put on and taking off regular upper body clothing?: A Little Help from another person to put on and taking off regular lower body clothing?: A Lot 6 Click Score: 15   End of Session    Activity Tolerance: Other (comment) (Pt limited by cognition) Patient left: in bed;with call bell/phone within Richard;with bed alarm set  OT Visit Diagnosis: Other abnormalities of gait and mobility (R26.89);History of falling (Z91.81);Muscle weakness (generalized) (M62.81)                Time: 7026-3785 OT Time Calculation (min): 16 min Charges:  OT General Charges $OT Visit: 1 Visit OT Evaluation $OT Eval Moderate Complexity: 1 Mod  Kathie Dike, M.S. OTR/L  06/24/20, 3:14 PM  ascom [phone]

## 2020-06-24 NOTE — Evaluation (Signed)
Physical Therapy Evaluation Patient Details Name: Kristen Richard MRN: [id number] DOB: Jul 27, 1936 Today's Date: 06/24/2020   History of Present Illness  Kristen Richard is an 83yoF who comes to [facility] on 8/16 due to weakness adn AMS. PMH: AF off anticoagulation 2/2/ falls, BB, HTN, LEE. Recently hospitalized from 7/26-8/2 for nausea, vomiting diarrhea abdominal pain. Per chart review, pt perfrorms mostly transfers in home with assist, manual WC in community.  Clinical Impression  Pt admitted with above diagnosis. Pt currently with functional limitations due to the deficits listed below (see "PT Problem List"). Upon entry, pt in bed, awake and agreeable to participate. The pt is alert and oriented to self,but struggles with other orientation questions. Pt mostly pleasant, conversational, but expresses a desire to not partake in mobility when cued. Pt is participatory when assessing general limb mobility and strength. Pt typically requies physical assist for basic mobility at home, required up to maxA last week whilst admitted here. Pt appears to be close to her baseline this date if not a bit weaker. Functional mobility assessment demonstrates increased effort/time requirements, poor tolerance, and need for physical assistance, whereas the patient performed these at a higher level of independence PTA. Pt will benefit from skilled PT intervention to increase independence and safety with basic mobility in preparation for discharge to the venue listed below.       Follow Up Recommendations Home health PT (Needs facility level care, but family has opted to provide this in the past. Pt has chronic mobility limitations and and no desire to improve her mobility.)    Equipment Recommendations       Recommendations for Other Services       Precautions / Restrictions Precautions Precautions: Fall Precaution Comments: Aspiration (from last week) Restrictions Weight Bearing Restrictions: No      Mobility  Bed  Mobility Overal bed mobility:  (pt refuses)             General bed mobility comments: slouched over to Rt in bed; able to long sit and touch ankles with LUE, but remains supported on Rt elbow.  Transfers                    Ambulation/Gait                Stairs            Wheelchair Mobility    Modified Rankin (Stroke Patients Only)       Balance Overall balance assessment: History of Falls                                           Pertinent Vitals/Pain Pain Assessment: No/denies pain    Home Living Family/patient expects to be discharged to:: Private residence Living Arrangements: Other relatives (granddaughter) Available Help at Discharge: Family;Available 24 hours/day Type of Home: House Home Access: Ramped entrance     Home Layout: One level Home Equipment: Walker - 2 wheels;Bedside commode;Cane - single point;Wheelchair - manual Additional Comments: Lift chair that turns into flat bed and also helps pt stand    Prior Function Level of Independence: Needs assistance   Gait / Transfers Assistance Needed: Pt's grand-daughter reporting pt needing assist at times to stand but then normally able to walk a few feet with RW recliner to/from [facility]; uses manual w/c in community  ADL's / Homemaking Assistance Needed: Takes sponge baths  Hand Dominance   Dominant Hand: Right    Extremity/Trunk Assessment   Upper Extremity Assessment Upper Extremity Assessment: Generalized weakness    Lower Extremity Assessment Lower Extremity Assessment: Generalized weakness       Communication      Cognition Arousal/Alertness: Awake/alert Behavior During Therapy: Impulsive;Restless Overall Cognitive Status: Impaired/Different from baseline                                 General Comments: a little confused; generally not interested in doing much, often tries to redirect author and declines mobility.       General Comments      Exercises     Assessment/Plan    PT Assessment Patient needs continued PT services  PT Problem List Decreased strength;Decreased activity tolerance;Decreased balance;Decreased mobility;Decreased cognition;Decreased knowledge of use of DME;Decreased safety awareness;Decreased knowledge of precautions       PT Treatment Interventions DME instruction;Gait training;Functional mobility training;Therapeutic activities;Therapeutic exercise;Balance training;Patient/family education    PT Goals (Current goals can be found in the Care Plan section)  Acute Rehab PT Goals PT Goal Formulation: Patient unable to participate in goal setting Time For Goal Achievement: 07/08/20 Potential to Achieve Goals: Fair    Frequency Min 2X/week   Barriers to discharge        Co-evaluation               AM-PAC PT "6 Clicks" Mobility  Outcome Measure Help needed turning from your back to your side while in a flat bed without using bedrails?: A Little Help needed moving from lying on your back to sitting on the side of a flat bed without using bedrails?: A Lot Help needed moving to and from a bed to a chair (including a wheelchair)?: Total Help needed standing up from a chair using your arms (e.g., wheelchair or bedside chair)?: Total Help needed to walk in hospital room?: Total Help needed climbing 3-5 steps with a railing? : Total 6 Click Score: 9    End of Session Equipment Utilized During Treatment: Gait belt Activity Tolerance: Other (comment) (poorly motivated, somewhat confused, declines attempts to mobilize) Patient left: in bed;with call bell/phone within reach;with bed alarm set;with family/visitor present Nurse Communication: Mobility status PT Visit Diagnosis: Other abnormalities of gait and mobility (R26.89);Muscle weakness (generalized) (M62.81);Difficulty in walking, not elsewhere classified (R26.2)    Time: 5364-6803 PT Time Calculation (min) (ACUTE  ONLY): 13 min   Charges:   PT Evaluation $PT Eval Low Complexity: 1 Low          12:44 PM, 06/24/20 Rosamaria Lints, PT, DPT Physical Therapist - [facility]  [phone] (ASCOM)   Vanecia Limpert C 06/24/2020, 12:42 PM

## 2020-06-25 LAB — CBC WITH DIFFERENTIAL/PLATELET
Abs Immature Granulocytes: 0.1 10*3/uL — ABNORMAL HIGH (ref 0.00–0.07)
Basophils Absolute: 0 10*3/uL (ref 0.0–0.1)
Basophils Relative: 0 %
Eosinophils Absolute: 0 10*3/uL (ref 0.0–0.5)
Eosinophils Relative: 1 %
HCT: 25.1 % — ABNORMAL LOW (ref 36.0–46.0)
Hemoglobin: 8.8 g/dL — ABNORMAL LOW (ref 12.0–15.0)
Immature Granulocytes: 2 %
Lymphocytes Relative: 18 %
Lymphs Abs: 0.8 10*3/uL (ref 0.7–4.0)
MCH: 29.8 pg (ref 26.0–34.0)
MCHC: 35.1 g/dL (ref 30.0–36.0)
MCV: 85.1 fL (ref 80.0–100.0)
Monocytes Absolute: 0.3 10*3/uL (ref 0.1–1.0)
Monocytes Relative: 6 %
Neutro Abs: 3.1 10*3/uL (ref 1.7–7.7)
Neutrophils Relative %: 73 %
Platelets: 116 10*3/uL — ABNORMAL LOW (ref 150–400)
RBC: 2.95 MIL/uL — ABNORMAL LOW (ref 3.87–5.11)
RDW: 15.6 % — ABNORMAL HIGH (ref 11.5–15.5)
WBC: 4.3 10*3/uL (ref 4.0–10.5)
nRBC: 0.5 % — ABNORMAL HIGH (ref 0.0–0.2)

## 2020-06-25 LAB — MAGNESIUM: Magnesium: 2 mg/dL (ref 1.7–2.4)

## 2020-06-25 LAB — PHOSPHORUS: Phosphorus: 3.4 mg/dL (ref 2.5–4.6)

## 2020-06-25 LAB — BASIC METABOLIC PANEL
Anion gap: 12 (ref 5–15)
BUN: 11 mg/dL (ref 8–23)
CO2: 29 mmol/L (ref 22–32)
Calcium: 7.8 mg/dL — ABNORMAL LOW (ref 8.9–10.3)
Chloride: 92 mmol/L — ABNORMAL LOW (ref 98–111)
Creatinine, Ser: 0.52 mg/dL (ref 0.44–1.00)
GFR calc Af Amer: >60 mL/min (ref >60)
GFR calc non Af Amer: >60 mL/min (ref >60)
Glucose, Bld: 77 mg/dL (ref 70–99)
Potassium: 3 mmol/L — ABNORMAL LOW (ref 3.5–5.1)
Sodium: 133 mmol/L — ABNORMAL LOW (ref 135–145)

## 2020-06-25 MED ORDER — POTASSIUM CHLORIDE 10 MEQ/100ML IV SOLN
10.0000 meq | INTRAVENOUS | Status: AC
  Administered 2020-06-25 (×5): 10 meq via INTRAVENOUS
  Filled 2020-06-25 (×5): qty 100

## 2020-06-25 MED ORDER — POTASSIUM CHLORIDE 10 MEQ/100ML IV SOLN
10.0000 meq | Freq: Once | INTRAVENOUS | Status: AC
  Administered 2020-06-25: 10 meq via INTRAVENOUS

## 2020-06-25 NOTE — NC FL2 (Signed)
[place] MEDICAID FL2 LEVEL OF CARE SCREENING TOOL     IDENTIFICATION  Patient Name: Kristen Richard Birthdate: 1936/01/28 Sex: female Admission Date (Current Location): 06/23/2020  [facility] [place] Number:  Chiropodist and Address:         Provider Number: [id number]  Attending Physician Name and Address:  Darlin Priestly, MD  Relative Name and Phone Number:       Current Level of Care: Hospital Recommended Level of Care: Skilled Nursing Facility Prior Approval Number:    Date Approved/Denied:   PASRR Number: [id number]  Discharge Plan: SNF    Current Diagnoses: Patient Active Problem List   Diagnosis Date Noted  . Protein-calorie malnutrition, severe 06/24/2020  . Weakness 06/23/2020  . Acute metabolic encephalopathy 06/23/2020  . Failure to thrive in adult 06/23/2020  . Persistent atrial fibrillation (HCC) 06/23/2020  . Anemia 06/23/2020  . Thrombocytopenia (HCC) 06/23/2020  . Metabolic acidosis, increased anion gap   . Hypomagnesemia   . Symptomatic cholelithiasis   . Hyperkalemia   . Hypochloremia 06/02/2020  . Regurgitation of food   . AKI (acute kidney injury) (HCC) 12/25/2019  . Abdominal pain 12/25/2019  . Hypertension   . Hypokalemia   . GERD (gastroesophageal reflux disease)   . Fall   . Dehydration   . Pressure injury of skin 10/27/2019  . Rhabdomyolysis 10/26/2019  . Chronic kidney disease, stage II (mild) 10/29/2013  . Hematuria 09/24/2013    Orientation RESPIRATION BLADDER Height & Weight     Self  Normal Incontinent, External catheter Weight: 54.7 kg Height:  5\' 3"  (160 cm)  BEHAVIORAL SYMPTOMS/MOOD NEUROLOGICAL BOWEL NUTRITION STATUS      Incontinent Diet (regular)  AMBULATORY STATUS COMMUNICATION OF NEEDS Skin   Total Care Verbally PU Stage and Appropriate Care, Bruising                       Personal Care Assistance Level of Assistance  Total care           Functional Limitations Info             SPECIAL  CARE FACTORS FREQUENCY  PT (By licensed PT), OT (By licensed OT)                    Contractures Contractures Info: Not present    Additional Factors Info  Code Status, Allergies Code Status Info: DNR Allergies Info: Sulfa Antibiotics, Acetazolamide, Hydrochlorothiazide           Current Medications (06/25/2020):  This is the current hospital active medication list Current Facility-Administered Medications  Medication Dose Route Frequency Provider Last Rate Last Admin  . alum & mag hydroxide-simeth (MAALOX/MYLANTA) 200-200-20 MG/5ML suspension 15 mL  15 mL Oral Q6H PRN Tu, Ching T, DO      . ascorbic acid (VITAMIN C) tablet 500 mg  500 mg Oral BID 10-04-2004, MD   500 mg at 06/24/20 2208  . atorvastatin (LIPITOR) tablet 40 mg  40 mg Oral QHS Tu, Ching T, DO   40 mg at 06/24/20 2208  . cholecalciferol (VITAMIN D3) tablet 1,000 Units  1,000 Units Oral Daily Tu, Ching T, DO   1,000 Units at 06/24/20 0911  . docusate sodium (COLACE) capsule 100 mg  100 mg Oral BID PRN Tu, Ching T, DO      . enoxaparin (LOVENOX) injection 40 mg  40 mg Subcutaneous Q24H Tu, Ching T, DO   40 mg at 06/24/20 2208  .  feeding supplement (BOOST / RESOURCE BREEZE) liquid 1 Container  1 Container Oral TID BM Marrion Coy, MD      . folic acid injection 1 mg  1 mg Intravenous Daily Marrion Coy, MD   1 mg at 06/25/20 1000  . lipase/protease/amylase (CREON) capsule 24,000 Units  24,000 Units Oral TID Valentino Hue, MD   24,000 Units at 06/24/20 1133  . multivitamin with minerals tablet 1 tablet  1 tablet Oral Daily Marrion Coy, MD      . pantoprazole (PROTONIX) injection 40 mg  40 mg Intravenous QHS Tu, Ching T, DO   40 mg at 06/24/20 2208  . potassium chloride 10 mEq in 100 mL IVPB  10 mEq Intravenous Q1 Hr x 5 Darlin Priestly, MD 100 mL/hr at 06/25/20 1330 10 mEq at 06/25/20 1330  . vitamin B-12 (CYANOCOBALAMIN) tablet 1,000 mcg  1,000 mcg Oral Daily Tu, Ching T, DO   1,000 mcg at 06/24/20 9379     Discharge  Medications: Please see discharge summary for a list of discharge medications.  Relevant Imaging Results:  Relevant Lab Results:   Additional Information SSN:[id number]  Amariss Detamore, Julio Alm, RN

## 2020-06-25 NOTE — TOC Initial Note (Addendum)
Transition of Care [facility]) - Initial/Assessment Note    Patient Details  Name: Kristen Richard MRN: [id number] Date of Birth: 1936/04/21  Transition of Care [facility]) CM/SW Contact:    Chapman Fitch, RN Phone Number: 06/25/2020, 2:32 PM  Clinical Narrative:                 Patient admitted from home with weakness.  Patient alert to self.  Assessment completed with granddaughter Malachi Bonds via phone  Patient lives at her own home, and her granddaughter lives there with her.  States that approximately 6 hours a day the patient is at home alone.    [facility]P [facility]-  Family provides transportation [facility] - denies issues obtaining medications   Patient has RW, BSC, cane, WC and lift chair in the home.    Patient open with home health services through [facility].  [place] with [facility] aware of admission.    PT has assessed patient.   Per granddaughter patient's daughter is wanting patient to be placed at [facility] for Pt.  I have called daughter and left voicemail for [place].  Awaiting return call  Fl2 sent for signature Existing PASRR Bedsearch initiated   Received return call from daughter.  She confines she wants SNF placement for patient.  Daughter notified that currently patient is not participating with PT, and would have to be able to follow commands and participate with PT in order for insurance to approve a SNF stay.  Daughter states that if patient discharges to a home setting she will be going home with her instead of returning home with granddaughter    Expected Discharge Plan: Skilled Nursing Facility Barriers to Discharge: No Barriers Identified   Patient Goals and CMS Choice        Expected Discharge Plan and Services Expected Discharge Plan: Skilled Nursing Facility   Discharge Planning Services: CM Consult   Living arrangements for the past 2 months: Single Family Home                                      Prior Living  Arrangements/Services Living arrangements for the past 2 months: Single Family Home Lives with:: Relatives              Current home services: DME, Home OT, Home PT, Homehealth aide, Home RN    Activities of Daily Living Home Assistive Devices/Equipment: Environmental consultant (specify type), Cane (specify quad or straight), Eyeglasses, Dentures (specify type) ADL Screening (condition at time of admission) Patient's cognitive ability adequate to safely complete daily activities?: Yes Is the patient deaf or have difficulty hearing?: No Does the patient have difficulty seeing, even when wearing glasses/contacts?: No Does the patient have difficulty concentrating, remembering, or making decisions?: Yes Patient able to express need for assistance with ADLs?: Yes Does the patient have difficulty dressing or bathing?: No Independently performs ADLs?: Yes (appropriate for developmental age) Does the patient have difficulty walking or climbing stairs?: Yes Weakness of Legs: Both Weakness of Arms/Hands: None  Permission Sought/Granted                  Emotional Assessment       Orientation: : Oriented to Self      Admission diagnosis:  Dehydration [E86.0] Hypokalemia [E87.6] Weakness [R53.1] Failure to thrive in adult [R62.7] Patient Active Problem List   Diagnosis Date Noted  . Protein-calorie malnutrition, severe 06/24/2020  . Weakness  06/23/2020  . Acute metabolic encephalopathy 06/23/2020  . Failure to thrive in adult 06/23/2020  . Persistent atrial fibrillation (HCC) 06/23/2020  . Anemia 06/23/2020  . Thrombocytopenia (HCC) 06/23/2020  . Metabolic acidosis, increased anion gap   . Hypomagnesemia   . Symptomatic cholelithiasis   . Hyperkalemia   . Hypochloremia 06/02/2020  . Regurgitation of food   . AKI (acute kidney injury) (HCC) 12/25/2019  . Abdominal pain 12/25/2019  . Hypertension   . Hypokalemia   . GERD (gastroesophageal reflux disease)   . Fall   . Dehydration    . Pressure injury of skin 10/27/2019  . Rhabdomyolysis 10/26/2019  . Chronic kidney disease, stage II (mild) 10/29/2013  . Hematuria 09/24/2013   PCP:  [facility] Pharmacy:   [facility] - [place], [place] - [address] [place] [address] Phone: [phone] Fax: [phone]  [facility] [address] [place] [facility] [address] [place] [address] Phone: [phone] Fax: [phone]     Social Determinants of Health (SDOH) Interventions    Readmission Risk Interventions Readmission Risk Prevention Plan 06/25/2020  Transportation Screening Complete  HRI or Home Care Consult Complete  Medication Review (RN Care Manager) Complete  Some recent data might be hidden

## 2020-06-25 NOTE — Progress Notes (Signed)
PROGRESS NOTE    Kristen Richard  QQP:[id number] DOB: 1936-09-27 DOA: 06/23/2020 PCP: [facility]   Chief complaint.  Weakness and confusion  Brief Narrative: Kristen Richard is a 84 y.o. female with medical history significant for History of persistent atrial fibrillation not on anticoagulation due to frequent falls, history of sinus pause from beta-blocker, hypertension, lower extremity edema who presents with worsening weakness and confusion. Patient had a significant nausea vomiting, occasional diarrhea, poor appetite.  8/17.  Patient condition is consistent with malabsorption probably from pancreatic insufficiency.  Patient has significant B12 deficiency, folic acid deficiency, chronic diarrhea.  Started Creon.  Giving B12 and folic acid injection.  Supplement potassium, phosphorus and magnesium.   Assessment & Plan:   Active Problems:   Hypertension   Hypokalemia   Weakness   Acute metabolic encephalopathy   Failure to thrive in adult   Persistent atrial fibrillation (HCC)   Anemia   Thrombocytopenia (HCC)   Protein-calorie malnutrition, severe  #1.  Failure to thrive. Patient had significant nausea vomiting and diarrhea PTA, but not currently.  She has a poor appetite and weight loss.  She has significant malabsorption.  Started Creon.    #2.  Hypokalemia, hypomagnesemia, hypophosphatemia. Supplement through IV.  3.  Anemia due to B12 deficiency and folic acid deficiency. Most likely secondary to malabsorption.  Change oral supplements to injection.  4.  Acute metabolic encephalopathy. No known hx of dementia.    5.  Mild thrombocytopenia. Follow.  6.  Persistent atrial fibrillation. Not currently on anticoagulation.  Rate under control.  7.  Essential hypertension. Condition stable.  8.  Urinary retention. Bladder scan with residual more than 500, however, has been voiding since.   DVT prophylaxis: Lovenox SQ Code Status: DNR  Family  Communication:  Status is: inpatient Dispo:   The patient is from: home Anticipated d/c is to: undetermined Anticipated d/c date is: undetermined Patient currently is not medically stable to d/c due to: not eating/drinking.   Consultants:   None  Procedures: None Antimicrobials: None  Subjective: Pt confused and not oriented.  Has been refusing to eat or drink anything.  No N/V diarrhea noted.   Objective: Vitals:   06/24/20 1931 06/25/20 0406 06/25/20 0423 06/25/20 1150  BP: (!) 144/65 104/69 (!) 139/56 124/64  Pulse: 82 98 85 88  Resp: 16 16 17    Temp: (!) 97.5 F (36.4 C) 97.8 F (36.6 C) 98.1 F (36.7 C) 97.9 F (36.6 C)  TempSrc: Oral Oral Oral   SpO2: 100% 93% 100% 100%  Weight:      Height:        Intake/Output Summary (Last 24 hours) at 06/25/2020 1959 Last data filed at 06/25/2020 1545 Gross per 24 hour  Intake 925 ml  Output 950 ml  Net -25 ml   Filed Weights   06/23/20 1226  Weight: 54.7 kg    Examination:  Constitutional: NAD, alert, not oriented HEENT: conjunctivae and lids normal, EOMI CV: RRR no M,R,G. Distal pulses +2.  No cyanosis.   RESP: CTA B/L, normal respiratory effort  GI: +BS, NTND Extremities: No effusions, edema, or tenderness in BLE SKIN: warm, dry and intact Neuro: II - XII grossly intact.      Data Reviewed: I have personally reviewed following labs and imaging studies  CBC: Recent Labs  Lab 06/23/20 1230 06/24/20 0520 06/25/20 0542  WBC 6.9 5.3 4.3  NEUTROABS  --   --  3.1  HGB 9.9* 9.0* 8.8*  HCT  27.6* 26.0* 25.1*  MCV 84.7 85.8 85.1  PLT 127* 108* 116*   Basic Metabolic Panel: Recent Labs  Lab 06/23/20 1230 06/24/20 0520 06/25/20 0542  NA 135 133* 133*  K 2.4* 3.4* 3.0*  CL 91* 92* 92*  CO2 29 30 29   GLUCOSE 82 72 77  BUN 22 17 11   CREATININE 0.69 0.61 0.52  CALCIUM 8.2* 7.7* 7.8*  MG 1.2* 1.2* 2.0  PHOS  --  1.9* 3.4   GFR: Estimated Creatinine Clearance: 45.6 mL/min (by C-G formula based on  SCr of 0.52 mg/dL). Liver Function Tests: Recent Labs  Lab 06/23/20 1230  AST 23  ALT 15  ALKPHOS 67  BILITOT 1.9*  PROT 6.0*  ALBUMIN 2.7*   Recent Labs  Lab 06/23/20 1230  LIPASE 23   No results for input(s): AMMONIA in the last 168 hours. Coagulation Profile: No results for input(s): INR, PROTIME in the last 168 hours. Cardiac Enzymes: No results for input(s): CKTOTAL, CKMB, CKMBINDEX, TROPONINI in the last 168 hours. BNP (last 3 results) No results for input(s): PROBNP in the last 8760 hours. HbA1C: No results for input(s): HGBA1C in the last 72 hours. CBG: Recent Labs  Lab 06/23/20 1227 06/23/20 1607  GLUCAP 65* 84   Lipid Profile: No results for input(s): CHOL, HDL, LDLCALC, TRIG, CHOLHDL, LDLDIRECT in the last 72 hours. Thyroid Function Tests: No results for input(s): TSH, T4TOTAL, FREET4, T3FREE, THYROIDAB in the last 72 hours. Anemia Panel: No results for input(s): VITAMINB12, FOLATE, FERRITIN, TIBC, IRON, RETICCTPCT in the last 72 hours. Sepsis Labs: No results for input(s): PROCALCITON, LATICACIDVEN in the last 168 hours.  Recent Results (from the past 240 hour(s))  SARS Coronavirus 2 by RT PCR (hospital order, performed in [facility] hospital lab) Nasopharyngeal Nasopharyngeal Swab     Status: None   Collection Time: 06/23/20  2:55 PM   Specimen: Nasopharyngeal Swab  Result Value Ref Range Status   SARS Coronavirus 2 NEGATIVE NEGATIVE Final    Comment: (NOTE) SARS-CoV-2 target nucleic acids are NOT DETECTED.  The SARS-CoV-2 RNA is generally detectable in upper and lower respiratory specimens during the acute phase of infection. The lowest concentration of SARS-CoV-2 viral copies this assay can detect is 250 copies / mL. A negative result does not preclude SARS-CoV-2 infection and should not be used as the sole basis for treatment or other patient management decisions.  A negative result may occur with improper specimen collection / handling,  submission of specimen other than nasopharyngeal swab, presence of viral mutation(s) within the areas targeted by this assay, and inadequate number of viral copies (<250 copies / mL). A negative result must be combined with clinical observations, patient history, and epidemiological information.  Fact Sheet for Patients:   [facility]  Fact Sheet for Healthcare Providers: 06/25/20  This test is not yet approved or  cleared by the [employer] FDA and has been authorized for detection and/or diagnosis of SARS-CoV-2 by FDA under an Emergency Use Authorization (EUA).  This EUA will remain in effect (meaning this test can be used) for the duration of the COVID-19 declaration under Section 564(b)(1) of the Act, 21 U.S.C. section 360bbb-3(b)(1), unless the authorization is terminated or revoked sooner.  Performed at [facility], [address], [place], [address] [place]          Radiology Studies: No results found.      Scheduled Meds: . vitamin C  500 mg Oral BID  . atorvastatin  40 mg Oral  QHS  . cholecalciferol  1,000 Units Oral Daily  . enoxaparin (LOVENOX) injection  40 mg Subcutaneous Q24H  . feeding supplement  1 Container Oral TID BM  . folic acid  1 mg Intravenous Daily  . lipase/protease/amylase  24,000 Units Oral TID AC  . multivitamin with minerals  1 tablet Oral Daily  . pantoprazole (PROTONIX) IV  40 mg Intravenous QHS  . cyanocobalamin  1,000 mcg Oral Daily   Continuous Infusions:    LOS: 1 day     Darlin Priestly, MD [facility]   To contact the attending provider between 7A-7P or the covering provider during after hours 7P-7A, please log into the web site [email or web address] and access using universal [place] password for that web site. If you do not have the password, please call the hospital operator.  06/25/2020, 7:59 PM

## 2020-06-26 ENCOUNTER — Other Ambulatory Visit: Payer: Medicare HMO

## 2020-06-26 DIAGNOSIS — E43 Unspecified severe protein-calorie malnutrition: Secondary | ICD-10-CM

## 2020-06-26 DIAGNOSIS — Z515 Encounter for palliative care: Secondary | ICD-10-CM

## 2020-06-26 DIAGNOSIS — Z66 Do not resuscitate: Secondary | ICD-10-CM

## 2020-06-26 LAB — CBC
HCT: 27.1 % — ABNORMAL LOW (ref 36.0–46.0)
Hemoglobin: 9.2 g/dL — ABNORMAL LOW (ref 12.0–15.0)
MCH: 29.9 pg (ref 26.0–34.0)
MCHC: 33.9 g/dL (ref 30.0–36.0)
MCV: 88 fL (ref 80.0–100.0)
Platelets: 113 10*3/uL — ABNORMAL LOW (ref 150–400)
RBC: 3.08 MIL/uL — ABNORMAL LOW (ref 3.87–5.11)
RDW: 15.7 % — ABNORMAL HIGH (ref 11.5–15.5)
WBC: 8.8 10*3/uL (ref 4.0–10.5)
nRBC: 0 % (ref 0.0–0.2)

## 2020-06-26 LAB — GLUCOSE, CAPILLARY
Glucose-Capillary: 57 mg/dL — ABNORMAL LOW (ref 70–99)
Glucose-Capillary: 102 mg/dL — ABNORMAL HIGH (ref 70–99)

## 2020-06-26 LAB — BASIC METABOLIC PANEL
Anion gap: 13 (ref 5–15)
BUN: 9 mg/dL (ref 8–23)
CO2: 26 mmol/L (ref 22–32)
Calcium: 7.8 mg/dL — ABNORMAL LOW (ref 8.9–10.3)
Chloride: 95 mmol/L — ABNORMAL LOW (ref 98–111)
Creatinine, Ser: 0.59 mg/dL (ref 0.44–1.00)
GFR calc Af Amer: >60 mL/min (ref >60)
GFR calc non Af Amer: >60 mL/min (ref >60)
Glucose, Bld: 64 mg/dL — ABNORMAL LOW (ref 70–99)
Potassium: 3.8 mmol/L (ref 3.5–5.1)
Sodium: 134 mmol/L — ABNORMAL LOW (ref 135–145)

## 2020-06-26 LAB — MAGNESIUM: Magnesium: 1.9 mg/dL (ref 1.7–2.4)

## 2020-06-26 MED ORDER — DEXTROSE 50 % IV SOLN
INTRAVENOUS | Status: AC
  Filled 2020-06-26: qty 50

## 2020-06-26 MED ORDER — KCL IN DEXTROSE-NACL 40-5-0.9 MEQ/L-%-% IV SOLN
INTRAVENOUS | Status: AC
  Filled 2020-06-26: qty 1000

## 2020-06-26 MED ORDER — OSMOLITE 1.2 CAL PO LIQD: 1000.0000 mL | ORAL | Status: DC

## 2020-06-26 MED ORDER — DEXTROSE 50 % IV SOLN: 12.5000 g | INTRAVENOUS | Status: AC

## 2020-06-26 MED ORDER — DEXTROSE 50 % IV SOLN
25.0000 mL | Freq: Once | INTRAVENOUS | Status: AC
  Administered 2020-06-26: 25 mL via INTRAVENOUS

## 2020-06-26 NOTE — Consult Note (Signed)
Consultation Note Date: 06/26/2020   Patient Name: Kristen Richard  DOB: 13-May-1936  MRN: [id number]  Age / Sex: 84 y.o., female  PCP: [facility] Referring Physician: Darlin Priestly, MD  Reason for Consultation: Establishing goals of care and Psychosocial/spiritual support  HPI/Patient Profile: 84 y.o. female   admitted on 06/23/2020 with  medical history significant for History of persistent atrial fibrillation not on anticoagulation due to frequent falls, history of sinus pause from beta-blocker, hypertension, lower extremity edema who presents with worsening weakness and confusion.  Patient is living in her own home with family support.  She has had continued physical, functional and cognitive decline over the past several months.  Patient was recently hospitalized from 7/26-8/2 for nausea, vomiting diarrhea abdominal pain.  She underwent EGD on 06/06/2020 with some concern of nonspecific/eosinophilic esophagitis and also noted to have mild stenosis of gastric esophageal sphincter which was dilated.  She was also noted to have multiple electrolyte abnormalities secondary to GI losses and poor p.o. intake.  Daughter reports that since discharge from that admission patient has continued to decline.  She continues to have poor p.o. intake.  Patient currently lives with her niece but is in transition to move to her daughters house since her daughter will have someone monitor her during the day.  Daughter is open to having her be put into a SNF but states that patient does not have Medicare and they cannot afford it.   Currently patient is orieted only to self, continues with poor po intake.  Agitates easily  Family face treatment option decisions, advanced directive decisions and anticipatory care needs.  Clinical Assessment and Goals of Care:   This NP Lorinda Creed reviewed medical records, received  report from team, assessed the patient and then meet at the patient's bedside and spoke by phone with granddaughter/[name] and daughter/[name] to discuss diagnosis, prognosis, GOC, EOL wishes disposition and options.   Concept of Palliative Care was introduced as specialized medical care for people and their families living with serious illness.  If focuses on providing relief from the symptoms and stress of a serious illness.  The goal is to improve quality of life for both the patient and the family.  A  discussion was had today regarding advanced directives.  Concepts specific to code status, artifical feeding and hydration, continued IV antibiotics and rehospitalization was had.  The difference between a aggressive medical intervention path  and a palliative comfort care path for this patient at this time was had.  Values and goals of care important to patient and family were attempted to be elicited.  Daughter worries about future care needs for her mother. Needs help in discussing the financial impact of  transitions of care   Questions and concerns addressed.  Patient  encouraged to call with questions or concerns.     PMT will continue to support holistically.           No documented healthcare power of attorney.   [name] is her daughter and  main decision maker Per [name] patient does have two other daughter who are not involved in patient's care     SUMMARY OF RECOMMENDATIONS    Code Status/Advance Care Planning:  DNR   Palliative Prophylaxis:   Aspiration, Delirium Protocol, Frequent Pain Assessment and Oral Care  Additional Recommendations (Limitations, Scope, Preferences):  Full Scope Treatment   Open to temporary feeding tube  Psycho-social/Spiritual:   Desire for further Chaplaincy support:yes  Additional Recommendations: Education on Hospice  Prognosis:   Unable to determine  Discharge Planning: to be determined, family will need ongoing  support and guidance  regarding transition of care     Primary Diagnoses: Present on Admission: . Hypokalemia . Hypertension   I have reviewed the medical record, interviewed the patient and family, and examined the patient. The following aspects are pertinent.  Past Medical History:  Diagnosis Date  . DDD (degenerative disc disease), lumbar   . Fall    RISK  . GERD (gastroesophageal reflux disease)   . 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 Hypertension   . Intermittent atrial fibrillation (HCC)   . Scoliosis   . Vertigo    Social History   Socioeconomic History  . Marital status: Widowed    Spouse name: Not on file  . Number of children: Not on file  . Years of education: Not on file  . Highest education level: Not on file  Occupational History  . Not on file  Tobacco Use  . Smoking status: Former Smoker    Quit date: 11/08/2002    Years since quitting: 17.6  . Smokeless tobacco: Former Clinical biochemist  . Vaping Use: Never used  Substance and Sexual Activity  . Alcohol use: Not Currently  . Drug use: Never  . Sexual activity: Not on file  Other Topics Concern  . Not on file  Social History Narrative  . Not on file   Social Determinants of Health   Financial Resource Strain:   . Difficulty of Paying Living Expenses: Not on file  Food Insecurity:   . Worried About Programme researcher, broadcasting/film/video in the Last Year: Not on file  . Ran Out of Food in the Last Year: Not on file  Transportation Needs:   . Lack of Transportation (Medical): Not on file  . Lack of Transportation (Non-Medical): Not on file  Physical Activity:   . Days of Exercise per Week: Not on file  . Minutes of Exercise per Session: Not on file  Stress:   . Feeling of Stress : Not on file  Social Connections:   . Frequency of Communication with Friends and Family: Not on file  . Frequency of Social Gatherings with Friends and Family: Not on file  . Attends Religious Services: Not on  file  . Active Member of Clubs or Organizations: Not on file  . Attends Banker Meetings: Not on file  . Marital Status: Not on file   Family History  Problem Relation Age of Onset  . Heart attack Father   . Diabetes Sister   . Diabetes Brother    Scheduled Meds: . vitamin C  500 mg Oral BID  . atorvastatin  40 mg Oral QHS  . cholecalciferol  1,000 Units Oral Daily  . dextrose  12.5 g Intravenous STAT  . enoxaparin (LOVENOX) injection  40 mg Subcutaneous Q24H  . feeding supplement  1 Container Oral TID BM  . folic acid  1 mg Intravenous Daily  .  lipase/protease/amylase  24,000 Units Oral TID AC  . multivitamin with minerals  1 tablet Oral Daily  . pantoprazole (PROTONIX) IV  40 mg Intravenous QHS  . cyanocobalamin  1,000 mcg Oral Daily   Continuous Infusions: . dextrose 5 % and 0.9 % NaCl with KCl 40 mEq/L 50 mL/hr at 06/26/20 1230   PRN Meds:.alum & mag hydroxide-simeth, docusate sodium Medications Prior to Admission:  Prior to Admission medications   Medication Sig Start Date End Date Taking? Authorizing Provider  acetaminophen (TYLENOL) 325 MG tablet Take 325-650 mg by mouth every 6 (six) hours as needed for moderate pain or headache.    Yes [provider]  alum & mag hydroxide-simeth (MAALOX/MYLANTA) 200-200-20 MG/5ML suspension Take 15 mLs by mouth every 6 (six) hours as needed for indigestion or heartburn. 06/09/20  Yes Arnetha CourserAmin, Sumayya, MD  atorvastatin (LIPITOR) 40 MG tablet Take 40 mg by mouth at bedtime.  01/19/20  Yes [provider]  bimatoprost (LUMIGAN) 0.03 % ophthalmic solution Place 1 drop into both eyes daily.   Yes [provider]  cholecalciferol (VITAMIN D3) 25 MCG (1000 UNIT) tablet Take 1,000 Units by mouth daily. 12/10/19  Yes [provider]  docusate sodium (COLACE) 100 MG capsule Take 1 capsule (100 mg total) by mouth 2 (two) times daily as needed for mild constipation or moderate constipation. 06/09/20  Yes Arnetha CourserAmin,  Sumayya, MD  folic acid (FOLVITE) 1 MG tablet Take 1 tablet (1 mg total) by mouth daily. 06/10/20  Yes Arnetha CourserAmin, Sumayya, MD  furosemide (LASIX) 20 MG tablet Take 20 mg by mouth daily as needed for fluid or edema.  05/01/20  Yes [provider]  magnesium oxide (MAG-OX) 400 (241.3 Mg) MG tablet Take 2 tablets (800 mg total) by mouth daily. 01/01/20  Yes Lynn ItoAmery, Sahar, MD  Multiple Vitamin (MULTIVITAMIN WITH MINERALS) TABS tablet Take 1 tablet by mouth daily. 01/01/20  Yes Lynn ItoAmery, Sahar, MD  ondansetron (ZOFRAN-ODT) 4 MG disintegrating tablet Take 1 tablet (4 mg total) by mouth every 8 (eight) hours as needed for nausea or vomiting. 06/09/20  Yes Arnetha CourserAmin, Sumayya, MD  pantoprazole (PROTONIX) 40 MG tablet Take 1 tablet (40 mg total) by mouth daily. 01/01/20  Yes Lynn ItoAmery, Sahar, MD  phosphorus (K PHOS NEUTRAL) 155-852-130 MG tablet Take 2 tablets (500 mg total) by mouth 2 (two) times daily. 01/01/20  Yes Lynn ItoAmery, Sahar, MD  polyethylene glycol powder (GLYCOLAX/MIRALAX) 17 GM/SCOOP powder DISSOLVE 17 GRAMS IN 8 OZ OF FLUID LIQUID DRINK DAILY AS DIRECTED Patient taking differently: Take 17 g by mouth daily.  06/09/20  Yes Arnetha CourserAmin, Sumayya, MD  vitamin B-12 1000 MCG tablet Take 1 tablet (1,000 mcg total) by mouth daily. 06/10/20  Yes Arnetha CourserAmin, Sumayya, MD   Allergies  Allergen Reactions  . Sulfa Antibiotics Swelling    Pt was told it caused her brain to swell - unc documented in 2014, pt is unaware of this   . Acetazolamide Nausea And Vomiting    Dizziness   . Hydrochlorothiazide    Review of Systems  Unable to perform ROS: Dementia    Physical Exam Constitutional:      Appearance: She is normal weight. She is ill-appearing.  Cardiovascular:     Rate and Rhythm: Normal rate.  Pulmonary:     Effort: Pulmonary effort is normal.  Musculoskeletal:     Comments: Generalized weakness  Skin:    General: Skin is warm and dry.  Neurological:     Mental Status: She is alert.  Vital Signs: BP 127/69 (BP Location:  Left Arm)   Pulse 83   Temp (!) 97.5 F (36.4 C) (Oral)   Resp 16   Ht 5\' 3"  (1.6 m)   Wt 54.7 kg   SpO2 96%   BMI 21.35 kg/m  Pain Scale: 0-10   Pain Score: 0-No pain   SpO2: SpO2: 96 % O2 Device:SpO2: 96 % O2 Flow Rate: .   IO: Intake/output summary:   Intake/Output Summary (Last 24 hours) at 06/26/2020 1303 Last data filed at 06/26/2020 0400 Gross per 24 hour  Intake 0 ml  Output 1200 ml  Net -1200 ml    LBM: Last BM Date:  (prior to admission) Baseline Weight: Weight: 54.7 kg Most recent weight: Weight: 54.7 kg     Palliative Assessment/Data:     Time In: 1200 Time Out: 1310 Time Total: 70 minutes Greater than 50%  of this time was spent counseling and coordinating care related to the above assessment and plan.  Signed by: 06/28/2020, NP   Please contact Palliative Medicine Team phone at [phone] for questions and concerns.  For individual provider: See [phone]

## 2020-06-26 NOTE — Progress Notes (Signed)
Lab drew patient's sugar this am and it was 67. I gave patient orange juice with 1 packet of sugar in it and rechecked CBG and it was 57. I paged Webb Silversmith, NP and she said give 12.5 mg dextrose. Rechecked CBG and it was 102.

## 2020-06-26 NOTE — Progress Notes (Signed)
PROGRESS NOTE    Kristen Richard  XHB:[id number] DOB: 1936-11-05 DOA: 06/23/2020 PCP: Center, [facility]    Assessment & Plan:   Active Problems:   Hypertension   Hypokalemia   Weakness   Acute metabolic encephalopathy   Failure to thrive in adult   Persistent atrial fibrillation (HCC)   Anemia   Thrombocytopenia (HCC)   Protein-calorie malnutrition, severe    Kristen Richard a 84 y.o.femalewith medical history significant forHistory of persistent atrial fibrillation not on anticoagulation due to frequent falls, history of sinus pause from beta-blocker, hypertension, lower extremity edema who presents with worsening weakness and confusion. Patient had a significant nausea vomiting, occasional diarrhea, poor appetite.   #1.  Failure to thrive. --Patient had significant nausea vomiting and diarrhea PTA, but not currently.  She has a poor appetite and weight loss.   PLAN: --Start Dobhoff tube feed, per daughter  # Hypoglycemia 2/2 poor oral intake --Start D5@50  --BG q6h to monitor for hypoglycemia --Start tube feed  #2.  Hypokalemia, hypomagnesemia, hypophosphatemia. Supplement through IV.  3.  Anemia due to B12 deficiency and folic acid deficiency. Most likely secondary to malabsorption.  Change oral supplements to injection.  4.  Acute metabolic encephalopathy. No known hx of dementia.    5.  Mild thrombocytopenia. Follow.  6.  Persistent atrial fibrillation. Not currently on anticoagulation.  Rate under control.  7.  Essential hypertension. Condition stable.  8.  Urinary retention. Bladder scan with residual more than 500, however, has been voiding since.   DVT prophylaxis: Lovenox SQ Code Status: DNR  Family Communication: daughter updated on the phone today Status is: inpatient Dispo:   The patient is from: home Anticipated d/c is to: undetermined Anticipated d/c date is: undetermined Patient currently is not medically stable to d/c  due to: not eating or drinking, dropping BG, need to start tube feed   Subjective and Interval History:  Pt continued to refuse to eat or drink, and dropping BG despite being on D5 MIVF.  Still confused and disoriented.  Palliative consulted.  Talked with daughter who wanted to start tube feed.   Objective: Vitals:   06/26/20 1252 06/26/20 1956 06/27/20 0352 06/27/20 0402  BP: 127/69 112/77 124/65   Pulse: 83 77 83   Resp: 16 16 20    Temp: (!) 97.5 F (36.4 C) 98.3 F (36.8 C) 98.1 F (36.7 C)   TempSrc: Oral     SpO2: 96% 100% 100%   Weight:    53.6 kg  Height:        Intake/Output Summary (Last 24 hours) at 06/27/2020 0456 Last data filed at 06/27/2020 0300 Gross per 24 hour  Intake 851.9 ml  Output 1300 ml  Net -448.1 ml   Filed Weights   06/23/20 1226 06/27/20 0402  Weight: 54.7 kg 53.6 kg    Examination:   Constitutional: NAD, alert, not oriented, not coherent HEENT: conjunctivae and lids normal, EOMI CV: RRR no M,R,G. Distal pulses +2.  No cyanosis.   RESP: CTA B/L, normal respiratory effort  GI: +BS, NTND Extremities: No effusions, edema, or tenderness in BLE SKIN: warm, dry  Neuro: II - XII grossly intact.      Data Reviewed: I have personally reviewed following labs and imaging studies  CBC: Recent Labs  Lab 06/23/20 1230 06/24/20 0520 06/25/20 0542 06/26/20 0507  WBC 6.9 5.3 4.3 8.8  NEUTROABS  --   --  3.1  --   HGB 9.9* 9.0* 8.8* 9.2*  HCT 27.6*  26.0* 25.1* 27.1*  MCV 84.7 85.8 85.1 88.0  PLT 127* 108* 116* 113*   Basic Metabolic Panel: Recent Labs  Lab 06/23/20 1230 06/24/20 0520 06/25/20 0542 06/26/20 0507  NA 135 133* 133* 134*  K 2.4* 3.4* 3.0* 3.8  CL 91* 92* 92* 95*  CO2 29 30 29 26   GLUCOSE 82 72 77 64*  BUN 22 17 11 9   CREATININE 0.69 0.61 0.52 0.59  CALCIUM 8.2* 7.7* 7.8* 7.8*  MG 1.2* 1.2* 2.0 1.9  PHOS  --  1.9* 3.4  --    GFR: Estimated Creatinine Clearance: 45.6 mL/min (by C-G formula based on SCr of 0.59  mg/dL). Liver Function Tests: Recent Labs  Lab 06/23/20 1230  AST 23  ALT 15  ALKPHOS 67  BILITOT 1.9*  PROT 6.0*  ALBUMIN 2.7*   Recent Labs  Lab 06/23/20 1230  LIPASE 23   No results for input(s): AMMONIA in the last 168 hours. Coagulation Profile: No results for input(s): INR, PROTIME in the last 168 hours. Cardiac Enzymes: No results for input(s): CKTOTAL, CKMB, CKMBINDEX, TROPONINI in the last 168 hours. BNP (last 3 results) No results for input(s): PROBNP in the last 8760 hours. HbA1C: No results for input(s): HGBA1C in the last 72 hours. CBG: Recent Labs  Lab 06/23/20 1227 06/23/20 1607 06/26/20 0645 06/26/20 0732 06/27/20 0009  GLUCAP 65* 84 57* 102* 99   Lipid Profile: No results for input(s): CHOL, HDL, LDLCALC, TRIG, CHOLHDL, LDLDIRECT in the last 72 hours. Thyroid Function Tests: No results for input(s): TSH, T4TOTAL, FREET4, T3FREE, THYROIDAB in the last 72 hours. Anemia Panel: No results for input(s): VITAMINB12, FOLATE, FERRITIN, TIBC, IRON, RETICCTPCT in the last 72 hours. Sepsis Labs: No results for input(s): PROCALCITON, LATICACIDVEN in the last 168 hours.  Recent Results (from the past 240 hour(s))  SARS Coronavirus 2 by RT PCR (hospital order, performed in [facility] hospital lab) Nasopharyngeal Nasopharyngeal Swab     Status: None   Collection Time: 06/23/20  2:55 PM   Specimen: Nasopharyngeal Swab  Result Value Ref Range Status   SARS Coronavirus 2 NEGATIVE NEGATIVE Final    Comment: (NOTE) SARS-CoV-2 target nucleic acids are NOT DETECTED.  The SARS-CoV-2 RNA is generally detectable in upper and lower respiratory specimens during the acute phase of infection. The lowest concentration of SARS-CoV-2 viral copies this assay can detect is 250 copies / mL. A negative result does not preclude SARS-CoV-2 infection and should not be used as the sole basis for treatment or other patient management decisions.  A negative result may occur  with improper specimen collection / handling, submission of specimen other than nasopharyngeal swab, presence of viral mutation(s) within the areas targeted by this assay, and inadequate number of viral copies (<250 copies / mL). A negative result must be combined with clinical observations, patient history, and epidemiological information.  Fact Sheet for Patients:   [facility]  Fact Sheet for Healthcare Providers: 06/25/20  This test is not yet approved or  cleared by the [employer] FDA and has been authorized for detection and/or diagnosis of SARS-CoV-2 by FDA under an Emergency Use Authorization (EUA).  This EUA will remain in effect (meaning this test can be used) for the duration of the COVID-19 declaration under Section 564(b)(1) of the Act, 21 U.S.C. section 360bbb-3(b)(1), unless the authorization is terminated or revoked sooner.  Performed at [facility], [address] [place], [address] [place]       Radiology Studies: No results found.  Scheduled Meds: . vitamin C  500 mg Oral BID  . atorvastatin  40 mg Oral QHS  . cholecalciferol  1,000 Units Oral Daily  . dextrose  12.5 g Intravenous STAT  . enoxaparin (LOVENOX) injection  40 mg Subcutaneous Q24H  . feeding supplement  1 Container Oral TID BM  . folic acid  1 mg Intravenous Daily  . lipase/protease/amylase  24,000 Units Oral TID AC  . multivitamin with minerals  1 tablet Oral Daily  . pantoprazole (PROTONIX) IV  40 mg Intravenous QHS  . cyanocobalamin  1,000 mcg Oral Daily   Continuous Infusions: . dextrose 5 % and 0.9 % NaCl with KCl 40 mEq/L 50 mL/hr at 06/27/20 0300  . feeding supplement (OSMOLITE 1.2 CAL)       LOS: 3 days     Darlin Priestly, MD [facility] If 7PM-7AM, please contact night-coverage 06/27/2020, 4:56 AM

## 2020-06-27 ENCOUNTER — Inpatient Hospital Stay: Payer: Medicare HMO

## 2020-06-27 ENCOUNTER — Other Ambulatory Visit: Payer: Self-pay | Admitting: Adult Health Nurse Practitioner

## 2020-06-27 LAB — CBC
HCT: 25 % — ABNORMAL LOW (ref 36.0–46.0)
Hemoglobin: 8.8 g/dL — ABNORMAL LOW (ref 12.0–15.0)
MCH: 29.9 pg (ref 26.0–34.0)
MCHC: 35.2 g/dL (ref 30.0–36.0)
MCV: 85 fL (ref 80.0–100.0)
Platelets: 127 10*3/uL — ABNORMAL LOW (ref 150–400)
RBC: 2.94 MIL/uL — ABNORMAL LOW (ref 3.87–5.11)
RDW: 15.7 % — ABNORMAL HIGH (ref 11.5–15.5)
WBC: 8.6 10*3/uL (ref 4.0–10.5)
nRBC: 0 % (ref 0.0–0.2)

## 2020-06-27 LAB — BASIC METABOLIC PANEL
Anion gap: 7 (ref 5–15)
BUN: 8 mg/dL (ref 8–23)
CO2: 25 mmol/L (ref 22–32)
Calcium: 7.8 mg/dL — ABNORMAL LOW (ref 8.9–10.3)
Chloride: 101 mmol/L (ref 98–111)
Creatinine, Ser: 0.58 mg/dL (ref 0.44–1.00)
GFR calc Af Amer: >60 mL/min (ref >60)
GFR calc non Af Amer: >60 mL/min (ref >60)
Glucose, Bld: 90 mg/dL (ref 70–99)
Potassium: 4.2 mmol/L (ref 3.5–5.1)
Sodium: 133 mmol/L — ABNORMAL LOW (ref 135–145)

## 2020-06-27 LAB — GLUCOSE, CAPILLARY
Glucose-Capillary: 101 mg/dL — ABNORMAL HIGH (ref 70–99)
Glucose-Capillary: 115 mg/dL — ABNORMAL HIGH (ref 70–99)
Glucose-Capillary: 86 mg/dL (ref 70–99)
Glucose-Capillary: 93 mg/dL (ref 70–99)
Glucose-Capillary: 99 mg/dL (ref 70–99)
Glucose-Capillary: 99 mg/dL (ref 70–99)

## 2020-06-27 LAB — MAGNESIUM: Magnesium: 1.7 mg/dL (ref 1.7–2.4)

## 2020-06-27 MED ORDER — AQUADEKS PO LIQD: 15.0000 mL | Freq: Every day | ORAL | Status: DC

## 2020-06-27 MED ORDER — ASCORBIC ACID 500 MG PO TABS
500.0000 mg | ORAL_TABLET | Freq: Two times a day (BID) | ORAL | Status: DC
  Administered 2020-06-27 – 2020-07-02 (×9): 500 mg
  Filled 2020-06-27 (×10): qty 1

## 2020-06-27 MED ORDER — OSMOLITE 1.2 CAL PO LIQD
1000.0000 mL | ORAL | Status: DC
  Administered 2020-06-27 – 2020-06-29 (×3): 1000 mL

## 2020-06-27 MED ORDER — PANTOPRAZOLE SODIUM 40 MG PO PACK
40.0000 mg | PACK | Freq: Every day | ORAL | Status: DC
  Administered 2020-06-28 – 2020-07-02 (×5): 40 mg
  Filled 2020-06-27 (×5): qty 20

## 2020-06-27 MED ORDER — ATORVASTATIN CALCIUM 20 MG PO TABS
40.0000 mg | ORAL_TABLET | Freq: Every day | ORAL | Status: DC
  Administered 2020-06-27 – 2020-07-01 (×4): 40 mg
  Filled 2020-06-27 (×5): qty 2

## 2020-06-27 MED ORDER — VITAMIN B-12 1000 MCG PO TABS
1000.0000 ug | ORAL_TABLET | Freq: Every day | ORAL | Status: DC
  Administered 2020-06-28 – 2020-07-02 (×5): 1000 ug
  Filled 2020-06-27 (×5): qty 1

## 2020-06-27 MED ORDER — CHOLECALCIFEROL 10 MCG/ML (400 UNIT/ML) PO LIQD
1000.0000 [IU] | Freq: Every day | ORAL | Status: DC
  Administered 2020-06-28 – 2020-07-02 (×5): 1000 [IU]
  Filled 2020-06-27 (×5): qty 2.5

## 2020-06-27 MED ORDER — PANTOPRAZOLE SODIUM 40 MG PO TBEC
40.0000 mg | DELAYED_RELEASE_TABLET | Freq: Every day | ORAL | Status: DC
  Filled 2020-06-27: qty 1

## 2020-06-27 MED ORDER — FOLIC ACID 1 MG PO TABS
1.0000 mg | ORAL_TABLET | Freq: Every day | ORAL | Status: DC
  Filled 2020-06-27: qty 1

## 2020-06-27 MED ORDER — ADULT MULTIVITAMIN LIQUID CH
15.0000 mL | Freq: Every day | ORAL | Status: DC
  Administered 2020-06-28 – 2020-07-01 (×4): 15 mL
  Filled 2020-06-27 (×4): qty 15

## 2020-06-27 MED ORDER — KCL IN DEXTROSE-NACL 40-5-0.9 MEQ/L-%-% IV SOLN
INTRAVENOUS | Status: AC
  Filled 2020-06-27: qty 1000

## 2020-06-27 MED ORDER — FOLIC ACID 1 MG PO TABS
1.0000 mg | ORAL_TABLET | Freq: Every day | ORAL | Status: DC
  Administered 2020-06-28 – 2020-07-02 (×5): 1 mg
  Filled 2020-06-27 (×5): qty 1

## 2020-06-27 NOTE — Progress Notes (Signed)
Dr Fran Lowes had ordered Dobhoff tube to be placed overnight. I spoke with charge nurse, Viviann Spare and [facility] and she said we were not trained to insert this type of tube. Dr Fran Lowes made aware. This will likely be placed during day shift.

## 2020-06-27 NOTE — Care Management Important Message (Signed)
Important Message  Patient Details  Name: Kristen Richard MRN: [id number] Date of Birth: 01-22-1936   Medicare Important Message Given:  Yes     [name] 06/27/2020, 12:40 PM

## 2020-06-27 NOTE — Progress Notes (Signed)
PT Cancellation Note  Patient Details Name: Kristen Richard MRN: [id number] DOB: [birth year]   Cancelled Treatment:    Reason Eval/Treat Not Completed: Medical issues which prohibited therapy;Fatigue/lethargy limiting ability to participate (Pt in bed, awake. Appears somewhat uncomfortable (mild grimacing sustained), pt withdrawn, does not make eye contact or respond to attempts to interact.) Author attempted to perform AA/ROM of LUE, but becomes agitated and reports pain. Pt repositioned with head supported to allow for neutral cervical posturing, then used 2 draw sheets rolled to float heels. RN made aware.  2:10 PM, 06/27/20 Rosamaria Lints, PT, DPT Physical Therapist - [facility]  [phone] (ASCOM)    Guiliana Shor C 06/27/2020, 2:10 PM

## 2020-06-27 NOTE — Progress Notes (Signed)
Nutrition Follow-up  DOCUMENTATION CODES:   Severe malnutrition in context of social or environmental circumstances  INTERVENTION:  Initiate Osmolite 1.2 @ 20 ml/hr, advance 10 ml every 8 hours to goal rate 50 ml/hr (1440 ml/day) -Provides 1440 kcal, 80 grams of protein (meeting 100% of estimated needs) and 1181 ml free water  Continue Boost Breeze po TID, each supplement provides 250 kcal and 9 grams of protein  Continue Vitamin C 500 mg BID  Pt at high refeed risk; recommend monitoring K, Mg, and P daily as tube feedings advance towards goal  NUTRITION DIAGNOSIS:   Severe Malnutrition related to social / environmental circumstances (FTT, chronic poor appetite, advanced age) as evidenced by moderate fat depletion, severe fat depletion, moderate muscle depletion, severe muscle depletion, percent weight loss. -ongoing  GOAL:   Patient will meet greater than or equal to 90% of their needs -progressing, initiating tube feedings   MONITOR:   PO intake, Supplement acceptance, Labs, Weight trends, Skin, I & O's  REASON FOR ASSESSMENT:   Consult Enteral/tube feeding initiation and management  ASSESSMENT:   84 y.o. female with medical history significant for persistent atrial fibrillation not on anticoagulation due to frequent falls, esophageal dilation, history of sinus pause from beta-blocker, hypertension, lower extremity edema who presents with worsening weakness and confusion.  Patient continues to refuse po intake, dropping BG despite D5 IVF, remains confused and disoriented. Palliative consulted, Dobhoff to be placed for tube feedings per daughter.   Patient is at high refeed risk; will advance tube feeding slowly, recommend monitoring electrolytes.   Medications reviewed and include: Vit C 500 mg BID, Vit D3, Folic acid, Creon 24,000 units 3 times daily, MVI, Protonix, B12 D5 with NaCl with KCl 40 mEq  Labs:  CBGs 86,93,99,102, Na 133 (L) K/Mg - WNL  Diet Order:    Diet Order            Diet regular Room service appropriate? Yes; Fluid consistency: Thin  Diet effective now                 EDUCATION NEEDS:   Education needs have been addressed  Skin:  Skin Assessment: Reviewed RN Assessment (ecchymosis)  Last BM:  pta  Height:   Ht Readings from Last 1 Encounters:  06/23/20 5\' 3"  (1.6 m)    Weight:   Wt Readings from Last 1 Encounters:  06/27/20 53.6 kg    Ideal Body Weight:  52.3 kg  BMI:  Body mass index is 20.94 kg/m.  Estimated Nutritional Needs:   Kcal:  1400-1600kcal/day  Protein:  70-80g/day  Fluid:  >1.4L/day   06/29/20, RD, LDN Clinical Nutrition After Hours/Weekend Pager # in Amion

## 2020-06-27 NOTE — Progress Notes (Signed)
PROGRESS NOTE    Kristen Richard  YBW:[id number] DOB: 11/22/1935 DOA: 06/23/2020 PCP: [facility], [facility]    Assessment & Plan:   Active Problems:   Hypertension   Hypokalemia   Weakness   Acute metabolic encephalopathy   Failure to thrive in adult   Persistent atrial fibrillation (HCC)   Anemia   Thrombocytopenia (HCC)   Protein-calorie malnutrition, severe    Kristen Richard a 84 y.o.femalewith medical history significant forHistory of persistent atrial fibrillation not on anticoagulation due to frequent falls, history of sinus pause from beta-blocker, hypertension, lower extremity edema who presents with worsening weakness and confusion. Patient had a significant nausea vomiting, occasional diarrhea, poor appetite.   #1.  Failure to thrive 2/2 poor oral intake --Patient had significant nausea vomiting and diarrhea PTA, but not currently.  She has a poor appetite and weight loss.   PLAN: --Start Dobhoff tube feed today --Need to keep mittens on --monitor for refeeding syndrome  # Hypoglycemia 2/2 poor oral intake --d/c D5 MIVF now that pt is receiving tube feed --BG q6h to monitor for hypoglycemia  #2.  Hypokalemia, hypomagnesemia, hypophosphatemia. Supplement through IV.  3.  Anemia due to B12 deficiency and folic acid deficiency. Most likely secondary to malabsorption.   --replete  4.  Acute metabolic encephalopathy. No known hx of dementia.   --Pt remains confused and not oriented.  5.  Mild thrombocytopenia. Follow.  6.  Persistent atrial fibrillation. Not currently on anticoagulation.  Rate under control.  7.  Essential hypertension. Condition stable.  8.  Urinary retention. Bladder scan with residual more than 500, however, has been voiding since.  GERD --continue PPI   DVT prophylaxis: Lovenox SQ Code Status: DNR  Family Communication:  Status is: inpatient Dispo:   The patient is from: home Anticipated d/c is to:  undetermined Anticipated d/c date is: undetermined Patient currently is not medically stable to d/c due to: not eating or drinking, starting tube feed today   Subjective and Interval History:  Pt continued to be confused.  Denied pain.  Dobhoff tube placed today to start tube feed.   Objective: Vitals:   06/26/20 1956 06/27/20 0352 06/27/20 0402 06/27/20 1141  BP: 112/77 124/65  121/82  Pulse: 77 83  70  Resp: 16 20  16   Temp: 98.3 F (36.8 C) 98.1 F (36.7 C)    TempSrc:      SpO2: 100% 100%  100%  Weight:   53.6 kg   Height:        Intake/Output Summary (Last 24 hours) at 06/27/2020 1836 Last data filed at 06/27/2020 0615 Gross per 24 hour  Intake 730.17 ml  Output 901 ml  Net -170.83 ml   Filed Weights   06/23/20 1226 06/27/20 0402  Weight: 54.7 kg 53.6 kg    Examination:   Constitutional: NAD, awake, not oriented HEENT: conjunctivae and lids normal, EOMI CV: RRR no M,R,G. Distal pulses +2.  No cyanosis.   RESP: CTA B/L, normal respiratory effort  GI: +BS, ND, Dobhoff tube present Extremities: No effusions, edema in BLE.  Generalized swelling in upper UE's. SKIN: warm, dry.  Extensive bruising   Data Reviewed: I have personally reviewed following labs and imaging studies  CBC: Recent Labs  Lab 06/23/20 1230 06/24/20 0520 06/25/20 0542 06/26/20 0507 06/27/20 0618  WBC 6.9 5.3 4.3 8.8 8.6  NEUTROABS  --   --  3.1  --   --   HGB 9.9* 9.0* 8.8* 9.2* 8.8*  HCT 27.6*  26.0* 25.1* 27.1* 25.0*  MCV 84.7 85.8 85.1 88.0 85.0  PLT 127* 108* 116* 113* 127*   Basic Metabolic Panel: Recent Labs  Lab 06/23/20 1230 06/24/20 0520 06/25/20 0542 06/26/20 0507 06/27/20 0618  NA 135 133* 133* 134* 133*  K 2.4* 3.4* 3.0* 3.8 4.2  CL 91* 92* 92* 95* 101  CO2 29 30 29 26 25   GLUCOSE 82 72 77 64* 90  BUN 22 17 11 9 8   CREATININE 0.69 0.61 0.52 0.59 0.58  CALCIUM 8.2* 7.7* 7.8* 7.8* 7.8*  MG 1.2* 1.2* 2.0 1.9 1.7  PHOS  --  1.9* 3.4  --   --    GFR: Estimated  Creatinine Clearance: 45.6 mL/min (by C-G formula based on SCr of 0.58 mg/dL). Liver Function Tests: Recent Labs  Lab 06/23/20 1230  AST 23  ALT 15  ALKPHOS 67  BILITOT 1.9*  PROT 6.0*  ALBUMIN 2.7*   Recent Labs  Lab 06/23/20 1230  LIPASE 23   No results for input(s): AMMONIA in the last 168 hours. Coagulation Profile: No results for input(s): INR, PROTIME in the last 168 hours. Cardiac Enzymes: No results for input(s): CKTOTAL, CKMB, CKMBINDEX, TROPONINI in the last 168 hours. BNP (last 3 results) No results for input(s): PROBNP in the last 8760 hours. HbA1C: No results for input(s): HGBA1C in the last 72 hours. CBG: Recent Labs  Lab 06/27/20 0009 06/27/20 0602 06/27/20 0745 06/27/20 1136 06/27/20 1546  GLUCAP 99 93 86 99 101*   Lipid Profile: No results for input(s): CHOL, HDL, LDLCALC, TRIG, CHOLHDL, LDLDIRECT in the last 72 hours. Thyroid Function Tests: No results for input(s): TSH, T4TOTAL, FREET4, T3FREE, THYROIDAB in the last 72 hours. Anemia Panel: No results for input(s): VITAMINB12, FOLATE, FERRITIN, TIBC, IRON, RETICCTPCT in the last 72 hours. Sepsis Labs: No results for input(s): PROCALCITON, LATICACIDVEN in the last 168 hours.  Recent Results (from the past 240 hour(s))  SARS Coronavirus 2 by RT PCR (hospital order, performed in [facility] hospital lab) Nasopharyngeal Nasopharyngeal Swab     Status: None   Collection Time: 06/23/20  2:55 PM   Specimen: Nasopharyngeal Swab  Result Value Ref Range Status   SARS Coronavirus 2 NEGATIVE NEGATIVE Final    Comment: (NOTE) SARS-CoV-2 target nucleic acids are NOT DETECTED.  The SARS-CoV-2 RNA is generally detectable in upper and lower respiratory specimens during the acute phase of infection. The lowest concentration of SARS-CoV-2 viral copies this assay can detect is 250 copies / mL. A negative result does not preclude SARS-CoV-2 infection and should not be used as the sole basis for treatment or  other patient management decisions.  A negative result may occur with improper specimen collection / handling, submission of specimen other than nasopharyngeal swab, presence of viral mutation(s) within the areas targeted by this assay, and inadequate number of viral copies (<250 copies / mL). A negative result must be combined with clinical observations, patient history, and epidemiological information.  Fact Sheet for Patients:   [facility]  Fact Sheet for Healthcare Providers: 06/25/20  This test is not yet approved or  cleared by the [employer] FDA and has been authorized for detection and/or diagnosis of SARS-CoV-2 by FDA under an Emergency Use Authorization (EUA).  This EUA will remain in effect (meaning this test can be used) for the duration of the COVID-19 declaration under Section 564(b)(1) of the Act, 21 U.S.C. section 360bbb-3(b)(1), unless the authorization is terminated or revoked sooner.  Performed at [facility], [phone]  [address], [place], [place] [address]       Radiology Studies: DG Abd 1 View  Result Date: 06/27/2020 CLINICAL DATA:  Feeding tube placement EXAM: ABDOMEN - 1 VIEW COMPARISON:  12/25/2019. FINDINGS: Feeding tube passes into the stomach which is predominant to the right of midline as noted on the prior CT. The tip appears to be at the gastric antrum region. Normal bowel gas pattern. Barium in the colon from prior swallowing function study. Tubing overlying the abdomen may be urinary catheter tubing. IMPRESSION: Feeding tube tip in the region of the gastric antrum. Stomach is located to the right of midline. Normal bowel gas pattern. Electronically Signed   By: Marlan Palau M.D.   On: 06/27/2020 13:32     Scheduled Meds: . vitamin C  500 mg Per Tube BID  . atorvastatin  40 mg Per Tube QHS  . [START ON 06/28/2020] cholecalciferol  1,000 Units Per Tube Daily  . enoxaparin  (LOVENOX) injection  40 mg Subcutaneous Q24H  . feeding supplement  1 Container Oral TID BM  . [START ON 06/28/2020] folic acid  1 mg Per Tube Daily  . lipase/protease/amylase  24,000 Units Oral TID AC  . [START ON 06/28/2020] multivitamin  15 mL Per Tube Daily  . [START ON 06/28/2020] pantoprazole sodium  40 mg Per Tube Daily  . [START ON 06/28/2020] cyanocobalamin  1,000 mcg Per Tube Daily   Continuous Infusions: . dextrose 5 % and 0.9 % NaCl with KCl 40 mEq/L 50 mL/hr at 06/27/20 0526  . feeding supplement (OSMOLITE 1.2 CAL) 1,000 mL (06/27/20 1707)     LOS: 3 days     Darlin Priestly, MD [facility] If 7PM-7AM, please contact night-coverage 06/27/2020, 6:36 PM

## 2020-06-27 NOTE — Progress Notes (Signed)
OT Cancellation Note  Patient Details Name: Sumiye Hirth MRN: [id number] DOB: [birth year]   Cancelled Treatment:    Reason Eval/Treat Not Completed: Patient declined, no reason specified. Upon entry pt found reclined in bed, unwilling to turn head to look at / engage c OT. Refuses all mobility and ADL attempts this date. Attempted motivational interviewing and when asked if she wants to go home or to a facility pt states "I aint want to go nowhere." Will re-attempt at later date/time as able and pt willing to participate.   Kathie Dike, M.S. OTR/L  06/27/20, 11:13 AM  ascom [phone]

## 2020-06-28 LAB — CBC
HCT: 25.3 % — ABNORMAL LOW (ref 36.0–46.0)
Hemoglobin: 8.7 g/dL — ABNORMAL LOW (ref 12.0–15.0)
MCH: 30.1 pg (ref 26.0–34.0)
MCHC: 34.4 g/dL (ref 30.0–36.0)
MCV: 87.5 fL (ref 80.0–100.0)
Platelets: 133 10*3/uL — ABNORMAL LOW (ref 150–400)
RBC: 2.89 MIL/uL — ABNORMAL LOW (ref 3.87–5.11)
RDW: 16 % — ABNORMAL HIGH (ref 11.5–15.5)
WBC: 8.7 10*3/uL (ref 4.0–10.5)
nRBC: 0.2 % (ref 0.0–0.2)

## 2020-06-28 LAB — BASIC METABOLIC PANEL
Anion gap: 11 (ref 5–15)
BUN: 11 mg/dL (ref 8–23)
CO2: 23 mmol/L (ref 22–32)
Calcium: 8.2 mg/dL — ABNORMAL LOW (ref 8.9–10.3)
Chloride: 102 mmol/L (ref 98–111)
Creatinine, Ser: 0.59 mg/dL (ref 0.44–1.00)
GFR calc Af Amer: >60 mL/min (ref >60)
GFR calc non Af Amer: >60 mL/min (ref >60)
Glucose, Bld: 105 mg/dL — ABNORMAL HIGH (ref 70–99)
Potassium: 4.3 mmol/L (ref 3.5–5.1)
Sodium: 136 mmol/L (ref 135–145)

## 2020-06-28 LAB — GLUCOSE, CAPILLARY
Glucose-Capillary: 112 mg/dL — ABNORMAL HIGH (ref 70–99)
Glucose-Capillary: 110 mg/dL — ABNORMAL HIGH (ref 70–99)
Glucose-Capillary: 110 mg/dL — ABNORMAL HIGH (ref 70–99)
Glucose-Capillary: 121 mg/dL — ABNORMAL HIGH (ref 70–99)
Glucose-Capillary: 112 mg/dL — ABNORMAL HIGH (ref 70–99)

## 2020-06-28 LAB — PHOSPHORUS: Phosphorus: 1.6 mg/dL — ABNORMAL LOW (ref 2.5–4.6)

## 2020-06-28 LAB — MAGNESIUM: Magnesium: 1.6 mg/dL — ABNORMAL LOW (ref 1.7–2.4)

## 2020-06-28 MED ORDER — ACETAMINOPHEN 325 MG PO TABS: 650.0000 mg | ORAL_TABLET | Freq: Four times a day (QID) | ORAL | Status: DC | PRN

## 2020-06-28 MED ORDER — MAGNESIUM SULFATE 2 GM/50ML IV SOLN
2.0000 g | Freq: Once | INTRAVENOUS | Status: AC
  Administered 2020-06-28: 2 g via INTRAVENOUS
  Filled 2020-06-28: qty 50

## 2020-06-28 MED ORDER — SODIUM PHOSPHATES 45 MMOLE/15ML IV SOLN
30.0000 mmol | Freq: Once | INTRAVENOUS | Status: AC
  Administered 2020-06-28: 30 mmol via INTRAVENOUS
  Filled 2020-06-28: qty 10

## 2020-06-28 NOTE — Progress Notes (Signed)
Patient has refused oral medications and meals.

## 2020-06-28 NOTE — Progress Notes (Signed)
PROGRESS NOTE    Kristen Richard  KPV:[id number] DOB: [birth year] DOA: 06/23/2020 PCP: [facility], [facility]    Assessment & Plan:   Active Problems:   Hypertension   Hypokalemia   Weakness   Acute metabolic encephalopathy   Failure to thrive in adult   Persistent atrial fibrillation (HCC)   Anemia   Thrombocytopenia (HCC)   Protein-calorie malnutrition, severe    Mitchell Epling a 84 y.o.femalewith medical history significant forHistory of persistent atrial fibrillation not on anticoagulation due to frequent falls, history of sinus pause from beta-blocker, hypertension, lower extremity edema who presents with worsening weakness and confusion. Patient had a significant nausea vomiting, occasional diarrhea, poor appetite.   #1.  Failure to thrive 2/2 poor oral intake --Patient had significant nausea vomiting and diarrhea PTA, but not currently.  She has a poor appetite and weight loss.   --started Dobhoff tube feeding on 8/20 PLAN: --continue tube feed --Need to keep mittens on --monitor for refeeding syndrome  Refeeding syndrome Hypokalemia, hypomagnesemia, hypophosphatemia. --as expected, phos level dropped, as well as mag PLAN: --replete with IV phos and IV mag  # Hypoglycemia 2/2 poor oral intake --D5 MIVF d/c'ed after pt started tube feeds --BG q6h to monitor for hypoglycemia  3.  Anemia due to B12 deficiency and folic acid deficiency. Most likely secondary to malabsorption.   --Vit b12 and folate supplements  4.  Acute metabolic encephalopathy. No known hx of dementia.   --Pt remains confused and not oriented.  5.  Mild thrombocytopenia. Follow.  6.  Persistent atrial fibrillation. Not currently on anticoagulation.  --HR controlled.  7.  Essential hypertension. --BP wnl.  8.  Urinary retention. Bladder scan with residual more than 500, however, has been voiding since.  GERD --continue PPI   DVT prophylaxis: Lovenox SQ Code Status:  DNR  Family Communication: daughter updated at bedside today Status is: inpatient Dispo:   The patient is from: home Anticipated d/c is to: undetermined Anticipated d/c date is: undetermined Patient currently is not medically stable to d/c due to: not eating or drinking, currently on tube feed    Subjective and Interval History:  Pt somnolent, sleeping most of the time.  Tube feeding going well.     Objective: Vitals:   06/27/20 2124 06/28/20 0422 06/28/20 0427 06/28/20 1138  BP: (!) 99/58 111/65  122/70  Pulse: 79 89  81  Resp: 16 18  16   Temp:  (!) 97.5 F (36.4 C)  (!) 97.5 F (36.4 C)  TempSrc:  Oral  Oral  SpO2: 100% 100%  100%  Weight:   53.6 kg   Height:        Intake/Output Summary (Last 24 hours) at 06/28/2020 1858 Last data filed at 06/28/2020 1700 Gross per 24 hour  Intake 1194.71 ml  Output 0 ml  Net 1194.71 ml   Filed Weights   06/23/20 1226 06/27/20 0402 06/28/20 0427  Weight: 54.7 kg 53.6 kg 53.6 kg    Examination:   Constitutional: NAD, somnolent, confused CV: RRR no M,R,G. Distal pulses +2.  No cyanosis.   RESP: CTA B/L, normal respiratory effort  GI: +BS, ND Extremities: No effusions, edema, or tenderness in BLE SKIN: warm, dry and intact    Data Reviewed: I have personally reviewed following labs and imaging studies  CBC: Recent Labs  Lab 06/24/20 0520 06/25/20 0542 06/26/20 0507 06/27/20 0618 06/28/20 0621  WBC 5.3 4.3 8.8 8.6 8.7  NEUTROABS  --  3.1  --   --   --  HGB 9.0* 8.8* 9.2* 8.8* 8.7*  HCT 26.0* 25.1* 27.1* 25.0* 25.3*  MCV 85.8 85.1 88.0 85.0 87.5  PLT 108* 116* 113* 127* 133*   Basic Metabolic Panel: Recent Labs  Lab 06/24/20 0520 06/25/20 0542 06/26/20 0507 06/27/20 0618 06/28/20 0621  NA 133* 133* 134* 133* 136  K 3.4* 3.0* 3.8 4.2 4.3  CL 92* 92* 95* 101 102  CO2 30 29 26 25 23   GLUCOSE 72 77 64* 90 105*  BUN 17 11 9 8 11   CREATININE 0.61 0.52 0.59 0.58 0.59  CALCIUM 7.7* 7.8* 7.8* 7.8* 8.2*  MG 1.2*  2.0 1.9 1.7 1.6*  PHOS 1.9* 3.4  --   --  1.6*   GFR: Estimated Creatinine Clearance: 45.6 mL/min (by C-G formula based on SCr of 0.59 mg/dL). Liver Function Tests: Recent Labs  Lab 06/23/20 1230  AST 23  ALT 15  ALKPHOS 67  BILITOT 1.9*  PROT 6.0*  ALBUMIN 2.7*   Recent Labs  Lab 06/23/20 1230  LIPASE 23   No results for input(s): AMMONIA in the last 168 hours. Coagulation Profile: No results for input(s): INR, PROTIME in the last 168 hours. Cardiac Enzymes: No results for input(s): CKTOTAL, CKMB, CKMBINDEX, TROPONINI in the last 168 hours. BNP (last 3 results) No results for input(s): PROBNP in the last 8760 hours. HbA1C: No results for input(s): HGBA1C in the last 72 hours. CBG: Recent Labs  Lab 06/27/20 2355 06/28/20 0403 06/28/20 0727 06/28/20 1133 06/28/20 1534  GLUCAP 115* 112* 110* 110* 121*   Lipid Profile: No results for input(s): CHOL, HDL, LDLCALC, TRIG, CHOLHDL, LDLDIRECT in the last 72 hours. Thyroid Function Tests: No results for input(s): TSH, T4TOTAL, FREET4, T3FREE, THYROIDAB in the last 72 hours. Anemia Panel: No results for input(s): VITAMINB12, FOLATE, FERRITIN, TIBC, IRON, RETICCTPCT in the last 72 hours. Sepsis Labs: No results for input(s): PROCALCITON, LATICACIDVEN in the last 168 hours.  Recent Results (from the past 240 hour(s))  SARS Coronavirus 2 by RT PCR (hospital order, performed in [facility] hospital lab) Nasopharyngeal Nasopharyngeal Swab     Status: None   Collection Time: 06/23/20  2:55 PM   Specimen: Nasopharyngeal Swab  Result Value Ref Range Status   SARS Coronavirus 2 NEGATIVE NEGATIVE Final    Comment: (NOTE) SARS-CoV-2 target nucleic acids are NOT DETECTED.  The SARS-CoV-2 RNA is generally detectable in upper and lower respiratory specimens during the acute phase of infection. The lowest concentration of SARS-CoV-2 viral copies this assay can detect is 250 copies / mL. A negative result does not preclude  SARS-CoV-2 infection and should not be used as the sole basis for treatment or other patient management decisions.  A negative result may occur with improper specimen collection / handling, submission of specimen other than nasopharyngeal swab, presence of viral mutation(s) within the areas targeted by this assay, and inadequate number of viral copies (<250 copies / mL). A negative result must be combined with clinical observations, patient history, and epidemiological information.  Fact Sheet for Patients:   [facility]  Fact Sheet for Healthcare Providers: 06/25/20  This test is not yet approved or  cleared by the [employer] FDA and has been authorized for detection and/or diagnosis of SARS-CoV-2 by FDA under an Emergency Use Authorization (EUA).  This EUA will remain in effect (meaning this test can be used) for the duration of the COVID-19 declaration under Section 564(b)(1) of the Act, 21 U.S.C. section 360bbb-3(b)(1), unless the authorization is terminated or revoked sooner.  Performed at [facility], [address] [place], [place] [address]       Radiology Studies: DG Abd 1 View  Result Date: 06/27/2020 CLINICAL DATA:  Feeding tube placement EXAM: ABDOMEN - 1 VIEW COMPARISON:  12/25/2019. FINDINGS: Feeding tube passes into the stomach which is predominant to the right of midline as noted on the prior CT. The tip appears to be at the gastric antrum region. Normal bowel gas pattern. Barium in the colon from prior swallowing function study. Tubing overlying the abdomen may be urinary catheter tubing. IMPRESSION: Feeding tube tip in the region of the gastric antrum. Stomach is located to the right of midline. Normal bowel gas pattern. Electronically Signed   By: Marlan Palau M.D.   On: 06/27/2020 13:32     Scheduled Meds: . vitamin C  500 mg Per Tube BID  . atorvastatin  40 mg Per Tube QHS  .  cholecalciferol  1,000 Units Per Tube Daily  . enoxaparin (LOVENOX) injection  40 mg Subcutaneous Q24H  . feeding supplement  1 Container Oral TID BM  . folic acid  1 mg Per Tube Daily  . lipase/protease/amylase  24,000 Units Oral TID AC  . multivitamin  15 mL Per Tube Daily  . pantoprazole sodium  40 mg Per Tube Daily  . cyanocobalamin  1,000 mcg Per Tube Daily   Continuous Infusions: . feeding supplement (OSMOLITE 1.2 CAL) 1,000 mL (06/28/20 1818)     LOS: 4 days     Darlin Priestly, MD [facility] If 7PM-7AM, please contact night-coverage 06/28/2020, 6:58 PM

## 2020-06-28 NOTE — Progress Notes (Signed)
Physical Therapy Treatment Patient Details Name: Kristen Richard MRN: [id number] DOB: Jul 27, 1936 Today's Date: 06/28/2020    History of Present Illness Kristen Richard is an 83yoF who comes to [facility] on 8/16 due to weakness adn AMS. PMH: AF off anticoagulation 2/2/ falls, BB, HTN, LEE. Recently hospitalized from 7/26-8/2 for nausea, vomiting diarrhea abdominal pain. Per chart review, pt performs mostly transfers in home with assist, manual WC in community.    PT Comments    Patient lethargic but vitals safe limits for therapy. Completed bed exercises and attempt at supine to sit as tolerated by patient. Patient groaning at times and requested PT to "stop" when repositioning her in bed at end of session. Attempted supine <> sit but patient refused to continue participation when partially to sit so assisted back to bed. Required max A for all mobility and was unable to follow commands. Bed exercises were PROM/AAROM with pt gently resisting PT at times. Patient is not progressing at this point. Patient would benefit from continued skilled physical therapy to address remaining impairments and functional limitations to work towards stated goals and decrease caregiver burden, and return to PLOF or maximal functional independence.      Follow Up Recommendations  Home health PT (Needs facility level care, but family has opted to provide this in the past. Pt has chronic mobility limitations and and no desire to improve her mobility.)     Equipment Recommendations       Recommendations for Other Services       Precautions / Restrictions Precautions Precautions: Fall Precaution Comments: Aspiration (from last week) Restrictions Weight Bearing Restrictions: No    Mobility  Bed Mobility Overal bed mobility: Needs Assistance Bed Mobility: Rolling;Supine to Sit;Sit to Supine Rolling: Total assist   Supine to sit: Max assist Sit to supine: Max assist   General bed mobility comments: Attempted supint <>  sit. REquired max A to get feet off edge of bed and start rolling towards side of bed. Refused further attempts to assist to full sit so returned to bed with max A. Pat able to move legs a little.  Transfers                 General transfer comment: did not attempt weight bearing  Ambulation/Gait                 Stairs             Wheelchair Mobility    Modified Rankin (Stroke Patients Only)       Balance Overall balance assessment: History of Falls;Needs assistance Sitting-balance support: Feet unsupported;Bilateral upper extremity supported Sitting balance-Leahy Scale: Zero Sitting balance - Comments: pt leaning to R side and unwilling to attempt to come further to sit stating "I don't want to get up!"                                    Cognition Arousal/Alertness: Lethargic Behavior During Therapy: Flat affect Overall Cognitive Status: Impaired/Different from baseline Area of Impairment: Orientation;Memory;Following commands;Awareness;Problem solving                 Orientation Level: Disoriented to;Place;Time;Situation;Person   Memory: Decreased short-term memory Following Commands:  (does not follow commands)     Problem Solving: Slow processing;Decreased initiation;Difficulty sequencing;Requires verbal cues;Requires tactile cues General Comments: Patient drowsy and occasionally stated "stop" when attempting to reposition patient or "I don't want to get  up" when attempting to assist pt supine to sit. Otherwise groaning throughout session when PT moving limbs.      Exercises Other Exercises Other Exercises: Supine AAROM/PROM heel slides x 2, hip abduction/adduction x 20, and ankle pumps x 20 each side. Patient resisting slightly. Other Exercises: Repositioned patient from quarter turn to left side to quarter turn to R side and supported all limbs.    General Comments General comments (skin integrity, edema, etc.): HR 92 bpm,  SpO2 98%. Bruising over B lower legs and L arm very swollen and red. Repositioned patient to R side with limbs supported.      Pertinent Vitals/Pain Pain Assessment: Faces Faces Pain Scale: Hurts a little bit Pain Location: generalized groaning when PT moving legs Pain Intervention(s): Limited activity within patient's tolerance;Monitored during session;Repositioned    Home Living                      Prior Function            PT Goals (current goals can now be found in the care plan section) Acute Rehab PT Goals PT Goal Formulation: Patient unable to participate in goal setting Time For Goal Achievement: 07/08/20 Potential to Achieve Goals: Fair    Frequency    Min 2X/week      PT Plan Current plan remains appropriate    Co-evaluation              AM-PAC PT "6 Clicks" Mobility   Outcome Measure  Help needed turning from your back to your side while in a flat bed without using bedrails?: A Lot Help needed moving from lying on your back to sitting on the side of a flat bed without using bedrails?: Total Help needed moving to and from a bed to a chair (including a wheelchair)?: Total Help needed standing up from a chair using your arms (e.g., wheelchair or bedside chair)?: Total Help needed to walk in hospital room?: Total Help needed climbing 3-5 steps with a railing? : Total 6 Click Score: 7    End of Session   Activity Tolerance: Other (comment);Patient limited by lethargy (lethargy, declines to attempt to sit up, allows AAROM/PROM LE bed exercises) Patient left: in bed;with call bell/phone within reach;with bed alarm set Nurse Communication: Mobility status PT Visit Diagnosis: Other abnormalities of gait and mobility (R26.89);Muscle weakness (generalized) (M62.81);Difficulty in walking, not elsewhere classified (R26.2)     Time: 2951-8841 PT Time Calculation (min) (ACUTE ONLY): 23 min  Charges:  $Therapeutic Exercise: 8-22 mins $Therapeutic  Activity: 8-22 mins                     Luretha Murphy. Ilsa Iha, PT, DPT 06/28/20, 9:35 AM

## 2020-06-29 LAB — BASIC METABOLIC PANEL
Anion gap: 12 (ref 5–15)
BUN: 15 mg/dL (ref 8–23)
CO2: 23 mmol/L (ref 22–32)
Calcium: 7.9 mg/dL — ABNORMAL LOW (ref 8.9–10.3)
Chloride: 98 mmol/L (ref 98–111)
Creatinine, Ser: 0.81 mg/dL (ref 0.44–1.00)
GFR calc Af Amer: >60 mL/min (ref >60)
GFR calc non Af Amer: >60 mL/min (ref >60)
Glucose, Bld: 116 mg/dL — ABNORMAL HIGH (ref 70–99)
Potassium: 4.2 mmol/L (ref 3.5–5.1)
Sodium: 133 mmol/L — ABNORMAL LOW (ref 135–145)

## 2020-06-29 LAB — CBC
HCT: 25.1 % — ABNORMAL LOW (ref 36.0–46.0)
Hemoglobin: 7.9 g/dL — ABNORMAL LOW (ref 12.0–15.0)
MCH: 29.4 pg (ref 26.0–34.0)
MCHC: 31.5 g/dL (ref 30.0–36.0)
MCV: 93.3 fL (ref 80.0–100.0)
Platelets: 128 10*3/uL — ABNORMAL LOW (ref 150–400)
RBC: 2.69 MIL/uL — ABNORMAL LOW (ref 3.87–5.11)
RDW: 16 % — ABNORMAL HIGH (ref 11.5–15.5)
WBC: 7.5 10*3/uL (ref 4.0–10.5)
nRBC: 0.4 % — ABNORMAL HIGH (ref 0.0–0.2)

## 2020-06-29 LAB — GLUCOSE, CAPILLARY
Glucose-Capillary: 100 mg/dL — ABNORMAL HIGH (ref 70–99)
Glucose-Capillary: 121 mg/dL — ABNORMAL HIGH (ref 70–99)
Glucose-Capillary: 123 mg/dL — ABNORMAL HIGH (ref 70–99)
Glucose-Capillary: 154 mg/dL — ABNORMAL HIGH (ref 70–99)
Glucose-Capillary: 202 mg/dL — ABNORMAL HIGH (ref 70–99)
Glucose-Capillary: 247 mg/dL — ABNORMAL HIGH (ref 70–99)

## 2020-06-29 LAB — PHOSPHORUS: Phosphorus: 2.2 mg/dL — ABNORMAL LOW (ref 2.5–4.6)

## 2020-06-29 LAB — MAGNESIUM: Magnesium: 1.9 mg/dL (ref 1.7–2.4)

## 2020-06-29 MED ORDER — SODIUM PHOSPHATES 45 MMOLE/15ML IV SOLN
30.0000 mmol | Freq: Once | INTRAVENOUS | Status: AC
  Administered 2020-06-29: 30 mmol via INTRAVENOUS
  Filled 2020-06-29: qty 10

## 2020-06-29 MED ORDER — ORAL CARE MOUTH RINSE
15.0000 mL | Freq: Two times a day (BID) | OROMUCOSAL | Status: DC
  Administered 2020-06-29 – 2020-07-03 (×8): 15 mL via OROMUCOSAL

## 2020-06-29 NOTE — TOC Progression Note (Signed)
Transition of Care [facility]) - Progression Note    Patient Details  Name: Kristen Richard MRN: [id number] Date of Birth: [birth year]  Transition of Care [facility]) CM/SW Contact  Eilleen Kempf, LCSW Phone Number: 06/29/2020, 9:29 AM  Clinical Narrative:    CSW contacted Pt's daughter (POA) [name] to discuss available bed offers, Shared the 4 offers so far. [name] requested contact info for [facility] and [facility] she will make a decision between those two and contact CM with her choice on Monday   Expected Discharge Plan: Skilled Nursing Facility Barriers to Discharge: No Barriers Identified  Expected Discharge Plan and Services Expected Discharge Plan: Skilled Nursing Facility   Discharge Planning Services: CM Consult   Living arrangements for the past 2 months: Single Family Home                                       Social Determinants of Health (SDOH) Interventions    Readmission Risk Interventions Readmission Risk Prevention Plan 06/25/2020  Transportation Screening Complete  HRI or Home Care Consult Complete  Medication Review [employer]) Complete  Some recent data might be hidden

## 2020-06-29 NOTE — Progress Notes (Signed)
PROGRESS NOTE    Kristen Richard  HER:[id number] DOB: January 10, 1936 DOA: 06/23/2020 PCP: [facility], [facility]    Assessment & Plan:   Active Problems:   Pressure injury of skin   Hypertension   Hypokalemia   Weakness   Acute metabolic encephalopathy   Failure to thrive in adult   Persistent atrial fibrillation (HCC)   Anemia   Thrombocytopenia (HCC)   Protein-calorie malnutrition, severe    Kristen Richard a 84 y.o.Caucasian femalewith medical history significant forHistory of persistent atrial fibrillation not on anticoagulation due to frequent falls, history of sinus pause from beta-blocker, hypertension, lower extremity edema who presents with worsening weakness and confusion. Patient had a significant nausea vomiting, occasional diarrhea, poor appetite.   # Acute metabolic encephalopathy. No known hx of dementia.   --Pt remains confused and not oriented.  --Pt more alert today  #1.  Failure to thrive 2/2 poor oral intake --Patient had significant nausea vomiting and diarrhea PTA, but not currently.  She has a poor appetite and weight loss.   --started Dobhoff tube feeding on 8/20 PLAN: --continue tube feed --monitor for refeeding syndrome  Refeeding syndrome Hypokalemia, hypomagnesemia, hypophosphatemia. --as expected, phos level dropped, as well as mag PLAN: --daily monitoring of electrolytes --replete with IV phos and IV mag PRN --replete with oral potassium PRN  # Hypoglycemia 2/2 poor oral intake --D5 MIVF d/c'ed after pt started tube feeds --BG q6h to monitor for hypoglycemia --continue tube feed  3.  Anemia due to B12 deficiency and folic acid deficiency. Most likely secondary to malabsorption.   --continue vit b12 and folate suppl via Dobhoff tube  5.  Mild thrombocytopenia. Follow.  6.  Persistent atrial fibrillation. Not currently on anticoagulation.  --currently in normal sinus  7.  Essential hypertension. --BP wnl.  8.  Urinary  retention. Bladder scan with residual more than 500, however, has been voiding since.  GERD --continue PPI   DVT prophylaxis: Lovenox SQ Code Status: DNR  Family Communication:  Status is: inpatient Dispo:   The patient is from: home Anticipated d/c is to: undetermined Anticipated d/c date is: undetermined Patient currently is not medically stable to d/c due to: not eating or drinking, currently on tube feed    Subjective and Interval History:  Pt was more alert today.  Still confused.  Able to understand that she should not pull off her Dobhoff tube.   Objective: Vitals:   06/29/20 0733 06/29/20 0925 06/29/20 1309 06/29/20 1459  BP: 109/79 126/64 136/80 131/68  Pulse: (!) 126 (!) 112 (!) 108 98  Resp: 19 (!) 24 20 20   Temp: 98.7 F (37.1 C) 98.9 F (37.2 C) 99.1 F (37.3 C) 98.2 F (36.8 C)  TempSrc:  Axillary Oral Oral  SpO2: 100% 97% 98% 99%  Weight:      Height:        Intake/Output Summary (Last 24 hours) at 06/29/2020 1910 Last data filed at 06/29/2020 1300 Gross per 24 hour  Intake 800 ml  Output 0 ml  Net 800 ml   Filed Weights   06/23/20 1226 06/27/20 0402 06/28/20 0427  Weight: 54.7 kg 53.6 kg 53.6 kg    Examination:   Constitutional: NAD, more alert today HEENT: conjunctivae and lids normal, EOMI CV: RRR no M,R,G. Distal pulses +2.  No cyanosis.   RESP: CTA B/L, normal respiratory effort  GI: +BS, NTND, Dobhoff tube present Extremities: generalized swelling in UE's SKIN: warm, dry.  Extensive bruising   Data Reviewed: I have  personally reviewed following labs and imaging studies  CBC: Recent Labs  Lab 06/25/20 0542 06/26/20 0507 06/27/20 0618 06/28/20 0621 06/29/20 0645  WBC 4.3 8.8 8.6 8.7 7.5  NEUTROABS 3.1  --   --   --   --   HGB 8.8* 9.2* 8.8* 8.7* 7.9*  HCT 25.1* 27.1* 25.0* 25.3* 25.1*  MCV 85.1 88.0 85.0 87.5 93.3  PLT 116* 113* 127* 133* 128*   Basic Metabolic Panel: Recent Labs  Lab 06/24/20 0520 06/24/20 0520  06/25/20 0542 06/26/20 0507 06/27/20 0618 06/28/20 0621 06/29/20 0645  NA 133*   < > 133* 134* 133* 136 133*  K 3.4*   < > 3.0* 3.8 4.2 4.3 4.2  CL 92*   < > 92* 95* 101 102 98  CO2 30   < > 29 26 25 23 23   GLUCOSE 72   < > 77 64* 90 105* 116*  BUN 17   < > 11 9 8 11 15   CREATININE 0.61   < > 0.52 0.59 0.58 0.59 0.81  CALCIUM 7.7*   < > 7.8* 7.8* 7.8* 8.2* 7.9*  MG 1.2*   < > 2.0 1.9 1.7 1.6* 1.9  PHOS 1.9*  --  3.4  --   --  1.6* 2.2*   < > = values in this interval not displayed.   GFR: Estimated Creatinine Clearance: 45.1 mL/min (by C-G formula based on SCr of 0.81 mg/dL). Liver Function Tests: Recent Labs  Lab 06/23/20 1230  AST 23  ALT 15  ALKPHOS 67  BILITOT 1.9*  PROT 6.0*  ALBUMIN 2.7*   Recent Labs  Lab 06/23/20 1230  LIPASE 23   No results for input(s): AMMONIA in the last 168 hours. Coagulation Profile: No results for input(s): INR, PROTIME in the last 168 hours. Cardiac Enzymes: No results for input(s): CKTOTAL, CKMB, CKMBINDEX, TROPONINI in the last 168 hours. BNP (last 3 results) No results for input(s): PROBNP in the last 8760 hours. HbA1C: No results for input(s): HGBA1C in the last 72 hours. CBG: Recent Labs  Lab 06/29/20 0007 06/29/20 0428 06/29/20 0736 06/29/20 1138 06/29/20 1547  GLUCAP 121* 123* 100* 154* 247*   Lipid Profile: No results for input(s): CHOL, HDL, LDLCALC, TRIG, CHOLHDL, LDLDIRECT in the last 72 hours. Thyroid Function Tests: No results for input(s): TSH, T4TOTAL, FREET4, T3FREE, THYROIDAB in the last 72 hours. Anemia Panel: No results for input(s): VITAMINB12, FOLATE, FERRITIN, TIBC, IRON, RETICCTPCT in the last 72 hours. Sepsis Labs: No results for input(s): PROCALCITON, LATICACIDVEN in the last 168 hours.  Recent Results (from the past 240 hour(s))  SARS Coronavirus 2 by RT PCR (hospital order, performed in [facility] hospital lab) Nasopharyngeal Nasopharyngeal Swab     Status: None   Collection Time: 06/23/20   2:55 PM   Specimen: Nasopharyngeal Swab  Result Value Ref Range Status   SARS Coronavirus 2 NEGATIVE NEGATIVE Final    Comment: (NOTE) SARS-CoV-2 target nucleic acids are NOT DETECTED.  The SARS-CoV-2 RNA is generally detectable in upper and lower respiratory specimens during the acute phase of infection. The lowest concentration of SARS-CoV-2 viral copies this assay can detect is 250 copies / mL. A negative result does not preclude SARS-CoV-2 infection and should not be used as the sole basis for treatment or other patient management decisions.  A negative result may occur with improper specimen collection / handling, submission of specimen other than nasopharyngeal swab, presence of viral mutation(s) within the areas targeted by this assay,  and inadequate number of viral copies (<250 copies / mL). A negative result must be combined with clinical observations, patient history, and epidemiological information.  Fact Sheet for Patients:   [employer]  Fact Sheet for Healthcare Providers: https://pope.com/  This test is not yet approved or  cleared by the [place] FDA and has been authorized for detection and/or diagnosis of SARS-CoV-2 by FDA under an Emergency Use Authorization (EUA).  This EUA will remain in effect (meaning this test can be used) for the duration of the COVID-19 declaration under Section 564(b)(1) of the Act, 21 U.S.C. section 360bbb-3(b)(1), unless the authorization is terminated or revoked sooner.  Performed at [facility], [address] [place], [place] [address]       Radiology Studies: No results found.   Scheduled Meds:  vitamin C  500 mg Per Tube BID   atorvastatin  40 mg Per Tube QHS   cholecalciferol  1,000 Units Per Tube Daily   enoxaparin (LOVENOX) injection  40 mg Subcutaneous Q24H   feeding supplement  1 Container Oral TID BM   folic acid  1 mg Per Tube Daily     lipase/protease/amylase  24,000 Units Oral TID AC   mouth rinse  15 mL Mouth Rinse q12n4p   multivitamin  15 mL Per Tube Daily   pantoprazole sodium  40 mg Per Tube Daily   cyanocobalamin  1,000 mcg Per Tube Daily   Continuous Infusions:  feeding supplement (OSMOLITE 1.2 CAL) 1,000 mL (06/29/20 1902)     LOS: 5 days     Darlin Priestly, MD [facility] If 7PM-7AM, please contact night-coverage 06/29/2020, 7:10 PM

## 2020-06-29 NOTE — Progress Notes (Signed)
Paged Hospitalist re: patient has become tachycardic with HR hanging around 130. Response is to continue to monitor due to patient's soft BP.

## 2020-06-30 DIAGNOSIS — R638 Other symptoms and signs concerning food and fluid intake: Secondary | ICD-10-CM

## 2020-06-30 LAB — CBC
HCT: 22.2 % — ABNORMAL LOW (ref 36.0–46.0)
Hemoglobin: 7.2 g/dL — ABNORMAL LOW (ref 12.0–15.0)
MCH: 28.9 pg (ref 26.0–34.0)
MCHC: 32.4 g/dL (ref 30.0–36.0)
MCV: 89.2 fL (ref 80.0–100.0)
Platelets: 136 10*3/uL — ABNORMAL LOW (ref 150–400)
RBC: 2.49 MIL/uL — ABNORMAL LOW (ref 3.87–5.11)
RDW: 15.9 % — ABNORMAL HIGH (ref 11.5–15.5)
WBC: 13.5 10*3/uL — ABNORMAL HIGH (ref 4.0–10.5)
nRBC: 0.1 % (ref 0.0–0.2)

## 2020-06-30 LAB — PHOSPHORUS: Phosphorus: 2.4 mg/dL — ABNORMAL LOW (ref 2.5–4.6)

## 2020-06-30 LAB — BASIC METABOLIC PANEL
Anion gap: 7 (ref 5–15)
BUN: 21 mg/dL (ref 8–23)
CO2: 26 mmol/L (ref 22–32)
Calcium: 7.9 mg/dL — ABNORMAL LOW (ref 8.9–10.3)
Chloride: 97 mmol/L — ABNORMAL LOW (ref 98–111)
Creatinine, Ser: 0.61 mg/dL (ref 0.44–1.00)
GFR calc Af Amer: >60 mL/min (ref >60)
GFR calc non Af Amer: >60 mL/min (ref >60)
Glucose, Bld: 137 mg/dL — ABNORMAL HIGH (ref 70–99)
Potassium: 4.2 mmol/L (ref 3.5–5.1)
Sodium: 130 mmol/L — ABNORMAL LOW (ref 135–145)

## 2020-06-30 LAB — GLUCOSE, CAPILLARY
Glucose-Capillary: 104 mg/dL — ABNORMAL HIGH (ref 70–99)
Glucose-Capillary: 116 mg/dL — ABNORMAL HIGH (ref 70–99)
Glucose-Capillary: 123 mg/dL — ABNORMAL HIGH (ref 70–99)
Glucose-Capillary: 152 mg/dL — ABNORMAL HIGH (ref 70–99)
Glucose-Capillary: 179 mg/dL — ABNORMAL HIGH (ref 70–99)
Glucose-Capillary: 97 mg/dL (ref 70–99)

## 2020-06-30 LAB — MAGNESIUM: Magnesium: 1.5 mg/dL — ABNORMAL LOW (ref 1.7–2.4)

## 2020-06-30 MED ORDER — SODIUM PHOSPHATES 45 MMOLE/15ML IV SOLN
30.0000 mmol | Freq: Once | INTRAVENOUS | Status: AC
  Administered 2020-06-30: 30 mmol via INTRAVENOUS
  Filled 2020-06-30: qty 10

## 2020-06-30 MED ORDER — MAGNESIUM SULFATE 2 GM/50ML IV SOLN
2.0000 g | Freq: Once | INTRAVENOUS | Status: AC
  Administered 2020-06-30: 2 g via INTRAVENOUS
  Filled 2020-06-30: qty 50

## 2020-06-30 NOTE — Care Management Important Message (Signed)
Important Message  Patient Details  Name: Stephenia Vogan MRN: [id number] Date of Birth: [birth year]   Medicare Important Message Given:  Yes     [name] 06/30/2020, 12:50 PM

## 2020-06-30 NOTE — Progress Notes (Signed)
PROGRESS NOTE    Kristen Richard  JQZ:[id number] DOB: November 13, 1935 DOA: 06/23/2020 PCP: [facility]    Assessment & Plan:   Active Problems:   Pressure injury of skin   Hypertension   Hypokalemia   Weakness   Acute metabolic encephalopathy   Failure to thrive in adult   Persistent atrial fibrillation (HCC)   Anemia   Thrombocytopenia (HCC)   Protein-calorie malnutrition, severe    Kristen Richard a 84 y.o.Caucasian femalewith medical history significant forHistory of persistent atrial fibrillation not on anticoagulation due to frequent falls, history of sinus pause from beta-blocker, hypertension, lower extremity edema who presents with worsening weakness and confusion. Patient had a significant nausea vomiting, occasional diarrhea, poor appetite.   # Acute metabolic encephalopathy. No known hx of dementia.   --Unclear etiology.  Pt remains confused and not oriented.  --monitor on tele  #1.  Failure to thrive 2/2 poor oral intake --Patient had significant nausea vomiting and diarrhea PTA, but not currently.  She has a poor appetite and weight loss.   --started Dobhoff tube feeding on 8/20, has refeeding syndrome PLAN: --continue tube feed --monitor and treat for refeeding syndrome  Refeeding syndrome Hypokalemia, hypomagnesemia, hypophosphatemia. --as expected, phos level dropped, as well as mag PLAN: --daily monitoring of electrolytes --replete with IV phos and IV mag today  # Hypoglycemia 2/2 poor oral intake --D5 MIVF d/c'ed after pt started tube feeds --BG q6h to monitor for hypoglycemia --continue tube feed  3.  Anemia due to B12 deficiency and folic acid deficiency. Most likely secondary to malabsorption.   --continue vit b12 and folate suppl via Dobhoff tube  5.  Mild thrombocytopenia. Follow.  6.  Persistent atrial fibrillation. Not currently on anticoagulation.  --currently in normal sinus  7.  Essential hypertension. --BP  wnl.  8.  Urinary retention. 1 episode.  Currently able to void.  GERD --continue PPI via G-tube  HLD --continue statin   DVT prophylaxis: Lovenox SQ Code Status: DNR Palliative consult following  Family Communication:  Status is: inpatient Dispo:   The patient is from: home Anticipated d/c is to: undetermined Anticipated d/c date is: undetermined Patient currently is not medically stable to d/c due to: not eating or drinking, currently on tube feed    Subjective and Interval History:  Pt was somnolent and confused again today.  Tube feeding going well, however, has refeeding syndrome needing aggressive electrolytes suppl.   Objective: Vitals:   06/29/20 1459 06/29/20 2018 06/30/20 0417 06/30/20 1147  BP: 131/68 138/68 (!) 130/59 103/69  Pulse: 98 (!) 108 (!) 110 (!) 106  Resp: 20   16  Temp: 98.2 F (36.8 C) 97.8 F (36.6 C) 97.9 F (36.6 C) (!) 97.5 F (36.4 C)  TempSrc: Oral Oral Oral Oral  SpO2: 99% 100% 100% 100%  Weight:      Height:        Intake/Output Summary (Last 24 hours) at 06/30/2020 1527 Last data filed at 06/30/2020 1400 Gross per 24 hour  Intake 1948.16 ml  Output 1 ml  Net 1947.16 ml   Filed Weights   06/23/20 1226 06/27/20 0402 06/28/20 0427  Weight: 54.7 kg 53.6 kg 53.6 kg    Examination:   Constitutional: NAD, somnolent but arousable HEENT: conjunctivae and lids normal, EOMI CV: RRR no M,R,G. Distal pulses +2.  No cyanosis.   RESP: CTA B/L, normal respiratory effort  GI: +BS, NTND, Dobhoff tube Extremities: mild general swelling in UE's SKIN: warm, dry and intact.  Extensive bruising.   Data Reviewed: I have personally reviewed following labs and imaging studies  CBC: Recent Labs  Lab 06/25/20 0542 06/25/20 0542 06/26/20 0507 06/27/20 0618 06/28/20 7322 06/29/20 0645 06/30/20 0812  WBC 4.3   < > 8.8 8.6 8.7 7.5 13.5*  NEUTROABS 3.1  --   --   --   --   --   --   HGB 8.8*   < > 9.2* 8.8* 8.7* 7.9* 7.2*  HCT 25.1*    < > 27.1* 25.0* 25.3* 25.1* 22.2*  MCV 85.1   < > 88.0 85.0 87.5 93.3 89.2  PLT 116*   < > 113* 127* 133* 128* 136*   < > = values in this interval not displayed.   Basic Metabolic Panel: Recent Labs  Lab 06/24/20 0520 06/24/20 0520 06/25/20 0542 06/25/20 0542 06/26/20 0507 06/27/20 0618 06/28/20 0254 06/29/20 0645 06/30/20 0445  NA 133*   < > 133*   < > 134* 133* 136 133* 130*  K 3.4*   < > 3.0*   < > 3.8 4.2 4.3 4.2 4.2  CL 92*   < > 92*   < > 95* 101 102 98 97*  CO2 30   < > 29   < > 26 25 23 23 26   GLUCOSE 72   < > 77   < > 64* 90 105* 116* 137*  BUN 17   < > 11   < > 9 8 11 15 21   CREATININE 0.61   < > 0.52   < > 0.59 0.58 0.59 0.81 0.61  CALCIUM 7.7*   < > 7.8*   < > 7.8* 7.8* 8.2* 7.9* 7.9*  MG 1.2*   < > 2.0   < > 1.9 1.7 1.6* 1.9 1.5*  PHOS 1.9*  --  3.4  --   --   --  1.6* 2.2* 2.4*   < > = values in this interval not displayed.   GFR: Estimated Creatinine Clearance: 45.6 mL/min (by C-G formula based on SCr of 0.61 mg/dL). Liver Function Tests: No results for input(s): AST, ALT, ALKPHOS, BILITOT, PROT, ALBUMIN in the last 168 hours. No results for input(s): LIPASE, AMYLASE in the last 168 hours. No results for input(s): AMMONIA in the last 168 hours. Coagulation Profile: No results for input(s): INR, PROTIME in the last 168 hours. Cardiac Enzymes: No results for input(s): CKTOTAL, CKMB, CKMBINDEX, TROPONINI in the last 168 hours. BNP (last 3 results) No results for input(s): PROBNP in the last 8760 hours. HbA1C: No results for input(s): HGBA1C in the last 72 hours. CBG: Recent Labs  Lab 06/29/20 2043 06/30/20 0013 06/30/20 0413 06/30/20 0734 06/30/20 1136  GLUCAP 202* 179* 152* 123* 116*   Lipid Profile: No results for input(s): CHOL, HDL, LDLCALC, TRIG, CHOLHDL, LDLDIRECT in the last 72 hours. Thyroid Function Tests: No results for input(s): TSH, T4TOTAL, FREET4, T3FREE, THYROIDAB in the last 72 hours. Anemia Panel: No results for input(s):  VITAMINB12, FOLATE, FERRITIN, TIBC, IRON, RETICCTPCT in the last 72 hours. Sepsis Labs: No results for input(s): PROCALCITON, LATICACIDVEN in the last 168 hours.  Recent Results (from the past 240 hour(s))  SARS Coronavirus 2 by RT PCR (hospital order, performed in [facility] hospital lab) Nasopharyngeal Nasopharyngeal Swab     Status: None   Collection Time: 06/23/20  2:55 PM   Specimen: Nasopharyngeal Swab  Result Value Ref Range Status   SARS Coronavirus 2 NEGATIVE NEGATIVE Final    Comment: (NOTE)  SARS-CoV-2 target nucleic acids are NOT DETECTED.  The SARS-CoV-2 RNA is generally detectable in upper and lower respiratory specimens during the acute phase of infection. The lowest concentration of SARS-CoV-2 viral copies this assay can detect is 250 copies / mL. A negative result does not preclude SARS-CoV-2 infection and should not be used as the sole basis for treatment or other patient management decisions.  A negative result may occur with improper specimen collection / handling, submission of specimen other than nasopharyngeal swab, presence of viral mutation(s) within the areas targeted by this assay, and inadequate number of viral copies (<250 copies / mL). A negative result must be combined with clinical observations, patient history, and epidemiological information.  Fact Sheet for Patients:   BoilerBrush.com.cy  Fact Sheet for Healthcare Providers: https://pope.com/  This test is not yet approved or  cleared by the [place] FDA and has been authorized for detection and/or diagnosis of SARS-CoV-2 by FDA under an Emergency Use Authorization (EUA).  This EUA will remain in effect (meaning this test can be used) for the duration of the COVID-19 declaration under Section 564(b)(1) of the Act, 21 U.S.C. section 360bbb-3(b)(1), unless the authorization is terminated or revoked sooner.  Performed at [facility],  [address] [place], [place] [address]       Radiology Studies: No results found.   Scheduled Meds: . vitamin C  500 mg Per Tube BID  . atorvastatin  40 mg Per Tube QHS  . cholecalciferol  1,000 Units Per Tube Daily  . enoxaparin (LOVENOX) injection  40 mg Subcutaneous Q24H  . feeding supplement  1 Container Oral TID BM  . folic acid  1 mg Per Tube Daily  . lipase/protease/amylase  24,000 Units Oral TID AC  . mouth rinse  15 mL Mouth Rinse q12n4p  . multivitamin  15 mL Per Tube Daily  . pantoprazole sodium  40 mg Per Tube Daily  . cyanocobalamin  1,000 mcg Per Tube Daily   Continuous Infusions: . feeding supplement (OSMOLITE 1.2 CAL) 50 mL/hr at 06/30/20 0316  . sodium phosphate  Dextrose 5% IVPB 43 mL/hr at 06/30/20 1400     LOS: 6 days     Darlin Priestly, MD [facility] If 7PM-7AM, please contact night-coverage 06/30/2020, 3:27 PM

## 2020-06-30 NOTE — Progress Notes (Signed)
Physical Therapy Treatment Patient Details Name: Kristen Richard MRN: [id number] DOB: 1936/05/11 Today's Date: 06/30/2020    History of Present Illness Kristen Richard is an 83yoF who comes to [facility] on 8/16 due to weakness adn AMS. PMH: AF off anticoagulation 2/2/ falls, BB, HTN, LEE. Recently hospitalized from 7/26-8/2 for nausea, vomiting diarrhea abdominal pain. Per chart review, pt performs mostly transfers in home with assist, manual WC in community.    PT Comments    Supine BLE PROM for available ranges.  Pt makes no attempt to assist.  Moans at times but no specific c/o.  Repositioned to comfort as body and head leaning to left heavily in bed.  Follow Up Recommendations  Home health PT (Needs facility level care, but family has opted to provide this in the past. Pt has chronic mobility limitations and and no desire to improve her mobility.)     Equipment Recommendations       Recommendations for Other Services       Precautions / Restrictions Precautions Precautions: Fall Precaution Comments: Aspiration (from last week) Restrictions Weight Bearing Restrictions: No    Mobility  Bed Mobility                  Transfers                    Ambulation/Gait                 Stairs             Wheelchair Mobility    Modified Rankin (Stroke Patients Only)       Balance                                            Cognition Arousal/Alertness: Lethargic Behavior During Therapy: Flat affect Overall Cognitive Status: Impaired/Different from baseline                                        Exercises      General Comments        Pertinent Vitals/Pain Pain Assessment: Faces Faces Pain Scale: Hurts little more Pain Location: moaning with attempts but does not describe or locate Pain Descriptors / Indicators: Sore Pain Intervention(s): Limited activity within patient's tolerance    Home Living                       Prior Function            PT Goals (current goals can now be found in the care plan section) Progress towards PT goals: Not progressing toward goals - comment    Frequency    Min 2X/week      PT Plan Current plan remains appropriate    Co-evaluation              AM-PAC PT "6 Clicks" Mobility   Outcome Measure  Help needed turning from your back to your side while in a flat bed without using bedrails?: Total Help needed moving from lying on your back to sitting on the side of a flat bed without using bedrails?: Total Help needed moving to and from a bed to a chair (including a wheelchair)?: Total Help needed standing up from a chair using your arms (e.g.,  wheelchair or bedside chair)?: Total Help needed to walk in hospital room?: Total Help needed climbing 3-5 steps with a railing? : Total 6 Click Score: 6    End of Session   Activity Tolerance: Other (comment);Patient limited by lethargy (lethargy, declines to attempt to sit up, allows AAROM/PROM LE bed exercises) Patient left: in bed;with call bell/phone within reach;with bed alarm set Nurse Communication: Mobility status PT Visit Diagnosis: Other abnormalities of gait and mobility (R26.89);Muscle weakness (generalized) (M62.81);Difficulty in walking, not elsewhere classified (R26.2)     Time: 9977-4142 PT Time Calculation (min) (ACUTE ONLY): 8 min  Charges:  $Therapeutic Exercise: 8-22 mins                    Danielle Dess, PTA 06/30/20, 12:43 PM

## 2020-06-30 NOTE — Progress Notes (Signed)
Occupational Therapy Treatment Patient Details Name: Kristen Richard MRN: [id number] DOB: 08/16/1936 Today's Date: 06/30/2020    History of present illness Kristen Richard is an 83yoF who comes to [facility] on 8/16 due to weakness adn AMS. PMH: AF off anticoagulation 2/2/ falls, BB, HTN, LEE. Recently hospitalized from 7/26-8/2 for nausea, vomiting diarrhea abdominal pain. Per chart review, pt performs mostly transfers in home with assist, manual WC in community.   OT comments  Pt supine in bed and lethargic. OT placing pt into chair position and turning music (Elvis) in an attempt to alert pt. She does keep eyes open during the remainder of the session. OT removed B hand mitts for skin inspections. Pt squeezes therapist hands. OT placed comb in her hand which she held onto but needing hand over hand assistance to comb hair. Use of lemon swab to continue to alert pt further and then PROM exercises for B UEs with B mitts donned at end of session and UEs repositioned. Pt left in chair position with all needs within reach and bed alarm activated. Pt likely better candidate for SNF rehab but family historically declined recommendation. If they take pt home she will need hospital bed and hoyer lift for safety.   Follow Up Recommendations  Home health OT;Supervision/Assistance - 24 hour    Equipment Recommendations  Hospital bed;Other (comment) (hoyer)       Precautions / Restrictions Precautions Precautions: Fall Precaution Comments: Aspiration (from last week) Restrictions Weight Bearing Restrictions: No       Mobility Bed Mobility Overal bed mobility: Needs Assistance Bed Mobility: Rolling Rolling: Total assist      General bed mobility comments: Pt making no move to assist therapist or initiate tasks           ADL either performed or assessed with clinical judgement   ADL Overall ADL's : Needs assistance/impaired   General ADL Comments: total A hand over hand this session.                Cognition Arousal/Alertness: Lethargic Behavior During Therapy: Flat affect Overall Cognitive Status: Impaired/Different from baseline                    Pertinent Vitals/ Pain       Pain Assessment: Faces Faces Pain Scale: Hurts a little bit Pain Location: generalized Pain Descriptors / Indicators: Sore Pain Intervention(s): Limited activity within patient's tolerance;Monitored during session;Repositioned         Frequency  Min 1X/week        Progress Toward Goals  OT Goals(current goals can now be found in the care plan section)  Progress towards OT goals: OT to reassess next treatment  Acute Rehab OT Goals Patient Stated Goal: none stated OT Goal Formulation: Patient unable to participate in goal setting Time For Goal Achievement: 07/08/20 Potential to Achieve Goals: Good  Plan Discharge plan remains appropriate       AM-PAC OT "6 Clicks" Daily Activity     Outcome Measure   Help from another person eating meals?: Total Help from another person taking care of personal grooming?: Total Help from another person toileting, which includes using toliet, bedpan, or urinal?: Total Help from another person bathing (including washing, rinsing, drying)?: Total Help from another person to put on and taking off regular upper body clothing?: Total Help from another person to put on and taking off regular lower body clothing?: Total 6 Click Score: 6    End of Session  OT Visit Diagnosis: Other abnormalities of gait and mobility (R26.89);History of falling (Z91.81);Muscle weakness (generalized) (M62.81)   Activity Tolerance Other (comment);Patient limited by lethargy (and cognition)   Patient Left in bed;with call bell/phone within reach;with bed alarm set   Nurse Communication Precautions        Time: 4709-6283 OT Time Calculation (min): 27 min  Charges: OT General Charges $OT Visit: 1 Visit OT Treatments $Self Care/Home Management : 8-22  mins $Therapeutic Activity: 8-22 mins  Jackquline Denmark, MS, OTR/L , CBIS ascom [phone]  06/30/20, 3:44 PM

## 2020-07-01 DIAGNOSIS — Z66 Do not resuscitate: Secondary | ICD-10-CM

## 2020-07-01 DIAGNOSIS — Z515 Encounter for palliative care: Secondary | ICD-10-CM

## 2020-07-01 LAB — PHOSPHORUS: Phosphorus: 3.3 mg/dL (ref 2.5–4.6)

## 2020-07-01 LAB — BASIC METABOLIC PANEL
Anion gap: 9 (ref 5–15)
BUN: 18 mg/dL (ref 8–23)
CO2: 25 mmol/L (ref 22–32)
Calcium: 8 mg/dL — ABNORMAL LOW (ref 8.9–10.3)
Chloride: 94 mmol/L — ABNORMAL LOW (ref 98–111)
Creatinine, Ser: 0.54 mg/dL (ref 0.44–1.00)
GFR calc Af Amer: >60 mL/min (ref >60)
GFR calc non Af Amer: >60 mL/min (ref >60)
Glucose, Bld: 95 mg/dL (ref 70–99)
Potassium: 4.7 mmol/L (ref 3.5–5.1)
Sodium: 128 mmol/L — ABNORMAL LOW (ref 135–145)

## 2020-07-01 LAB — MAGNESIUM: Magnesium: 2 mg/dL (ref 1.7–2.4)

## 2020-07-01 LAB — CBC
HCT: 20.3 % — ABNORMAL LOW (ref 36.0–46.0)
Hemoglobin: 6.7 g/dL — ABNORMAL LOW (ref 12.0–15.0)
MCH: 29.3 pg (ref 26.0–34.0)
MCHC: 33 g/dL (ref 30.0–36.0)
MCV: 88.6 fL (ref 80.0–100.0)
Platelets: 112 10*3/uL — ABNORMAL LOW (ref 150–400)
RBC: 2.29 MIL/uL — ABNORMAL LOW (ref 3.87–5.11)
RDW: 15.9 % — ABNORMAL HIGH (ref 11.5–15.5)
WBC: 11.4 10*3/uL — ABNORMAL HIGH (ref 4.0–10.5)
nRBC: 0.2 % (ref 0.0–0.2)

## 2020-07-01 LAB — ABO/RH: ABO/RH(D): O POS

## 2020-07-01 LAB — GLUCOSE, CAPILLARY
Glucose-Capillary: 93 mg/dL (ref 70–99)
Glucose-Capillary: 82 mg/dL (ref 70–99)
Glucose-Capillary: 97 mg/dL (ref 70–99)
Glucose-Capillary: 97 mg/dL (ref 70–99)
Glucose-Capillary: 90 mg/dL (ref 70–99)
Glucose-Capillary: 107 mg/dL — ABNORMAL HIGH (ref 70–99)

## 2020-07-01 LAB — PREPARE RBC (CROSSMATCH)

## 2020-07-01 MED ORDER — DOCUSATE SODIUM 50 MG/5ML PO LIQD
200.0000 mg | Freq: Two times a day (BID) | ORAL | Status: DC
  Administered 2020-07-01 (×2): 200 mg
  Filled 2020-07-01 (×4): qty 20

## 2020-07-01 MED ORDER — POLYETHYLENE GLYCOL 3350 17 G PO PACK
34.0000 g | PACK | ORAL | Status: AC
  Administered 2020-07-01 (×4): 34 g via NASOGASTRIC
  Filled 2020-07-01 (×4): qty 2

## 2020-07-01 MED ORDER — SODIUM CHLORIDE 0.9% IV SOLUTION: Freq: Once | INTRAVENOUS | Status: DC

## 2020-07-01 MED ORDER — FREE WATER
30.0000 mL | Status: DC
  Administered 2020-07-01 – 2020-07-02 (×7): 30 mL

## 2020-07-01 MED ORDER — OSMOLITE 1.2 CAL PO LIQD
1000.0000 mL | ORAL | Status: DC
  Administered 2020-07-01 – 2020-07-02 (×2): 1000 mL

## 2020-07-01 NOTE — Progress Notes (Signed)
Patient ID: Kristen Richard, female   DOB: 06-09-1936, 84 y.o.   MRN: [id number]  This NP visited patient at the bedside as a follow up for palliative medicine  needs and emotional support.  Patient remains lethargic.  Refusing p.o.'s even ice chips tube feeds continued via core track.  Refusing to work with therapies,  basically nonverbal.  Spoke to daughter/[name] by telephone.  Continued conversation regarding current medical situation; treatment option decisions advanced directive decisions and anticipatory care needs addressed.  Education offered on concept of adult failure to thrive and the limitations of medical interventions to prolong quality of life when the body does fail to thrive.  [name] clearly verbalizes that "none of this is going to help and why redoing it".  Education offered on the difference between an aggressive medical intervention path and a palliative comfort path for this patient at this time in this situation.  Education on hospice benefit offered  Questions and concerns addressed   Discussed with Dr Fran Lowes  Total time spent on the unit was 35 minutes   Greater than 50% of the time was spent in counseling and coordination of care  Lorinda Creed NP  Palliative Medicine Team Team Phone # [phone] Pager [phone]

## 2020-07-01 NOTE — Progress Notes (Signed)
Blood consent obtained via phone from daughter [name], see hard copy and consent confirmed by [employer].

## 2020-07-01 NOTE — Progress Notes (Signed)
Nutrition Follow Up Note   DOCUMENTATION CODES:   Severe malnutrition in context of social or environmental circumstances  INTERVENTION:   Increase Osmolite 1.2 to 69m/hr  Free water flushes 391mq4 hours to maintain tube patency   Regimen provides 1584kcal/day, 73g/day protein and 126213may free water   Vitamin C 500m58m BID  Bowel regimen as needed per MD  NUTRITION DIAGNOSIS:   Severe Malnutrition related to social / environmental circumstances (FTT, chronic poor appetite, advanced age) as evidenced by moderate to severe fat depletions, moderate to severe muscle depletions, 38 percent weight loss in 1 year and 21 percent weight loss in 6 months.  GOAL:   Patient will meet greater than or equal to 90% of their needs  -met with tube feeds  MONITOR:   PO intake, Supplement acceptance, Labs, Weight trends, Skin, I & O's, tube feeding   ASSESSMENT:   83 y76. female with medical history significant for persistent atrial fibrillation not on anticoagulation due to frequent falls, esophageal dilation, history of sinus pause from beta-blocker, hypertension, lower extremity edema who presents with worsening weakness and confusion.  Pt initiated on tube feeds via NGT on 8/20; pt tolerating well at goal rate. Refeed labs stable. No BM noted in chart; recommend bowel regimen as needed per MD. No new weight since 8/21; will request weekly weights.   Medications reviewed and include: vitamin C, D3, lovenox, folic acid, creon, protonix, B12  Labs reviewed: Na 128(L), K 4.7 wnl, Cl 94(L), P 3.3 wnl, Mg 2.0 wnl Wbc- 11.4(H), Hgb 6.7(L), Hct 20.3(L)  Diet Order:   Diet Order            Diet regular Room service appropriate? Yes; Fluid consistency: Thin  Diet effective now                EDUCATION NEEDS:   Education needs have been addressed  Skin:  Skin Assessment: Reviewed RN Assessment (ecchymosis)  Last BM:  unknown  Height:   Ht Readings from Last 1 Encounters:   06/23/20 '5\' 3"'  (1.6 m)    Weight:   Wt Readings from Last 1 Encounters:  06/28/20 53.6 kg    Ideal Body Weight:  52.3 kg  BMI:  Body mass index is 20.93 kg/m.  Estimated Nutritional Needs:   Kcal:  1400-1600kcal/day  Protein:  70-80g/day  Fluid:  >1.4L/day  CaseKoleen Distance RD, LDN Please refer to [facility] and/or RD on-call/weekend/after hours pager

## 2020-07-01 NOTE — Progress Notes (Signed)
Miralax initiated x2 doses without BM.

## 2020-07-01 NOTE — TOC Progression Note (Signed)
Transition of Care [facility]) - Progression Note    Patient Details  Name: Kristen Richard MRN: [id number] Date of Birth: 03-Aug-1936  [facility] CM/SW Contact  Chapman Fitch, RN Phone Number: 07/01/2020, 7:42 PM  Clinical Narrative:     Awaiting on final determination of goals of care.  At this time patient is still not able to participate with PT and would not be approved for SNF  Expected Discharge Plan: Skilled Nursing Facility Barriers to Discharge: No Barriers Identified  Expected Discharge Plan and Services Expected Discharge Plan: Skilled Nursing Facility   Discharge Planning Services: CM Consult   Living arrangements for the past 2 months: Single Family Home                                       Social Determinants of Health (SDOH) Interventions    Readmission Risk Interventions Readmission Risk Prevention Plan 06/25/2020  Transportation Screening Complete  HRI or Home Care Consult Complete  Medication Review Oceanographer) Complete  Some recent data might be hidden

## 2020-07-01 NOTE — Progress Notes (Signed)
Patient completed blood and tolerated well.  PIV flushed and saline locked. Patient turned and repositioned.

## 2020-07-01 NOTE — Progress Notes (Signed)
PROGRESS NOTE    Kristen Richard  YIR:[id number] DOB: 1935/11/20 DOA: 06/23/2020 PCP: [facility], [facility]    Assessment & Plan:   Active Problems:   Pressure injury of skin   Hypertension   Hypokalemia   Weakness   Acute metabolic encephalopathy   Failure to thrive in adult   Persistent atrial fibrillation (HCC)   Anemia   Thrombocytopenia (HCC)   Protein-calorie malnutrition, severe    [name] a 84 y.o.Caucasian femalewith medical history significant forHistory of persistent atrial fibrillation not on anticoagulation due to frequent falls, history of sinus pause from beta-blocker, hypertension, lower extremity edema who presents with worsening weakness and confusion. Patient had a significant nausea vomiting, occasional diarrhea, poor appetite.   # Acute metabolic encephalopathy. No known hx of dementia.   --Unclear etiology.  Pt remains confused and not oriented.  Mental status has not improved with tube feeding.  No signs of infection or metabolic derangement. --monitor on tele for now.  # Failure to thrive 2/2 poor oral intake --Patient had significant nausea vomiting and diarrhea PTA, but not currently.  She has a poor appetite and weight loss.   --started Dobhoff tube feeding on 8/20, has refeeding syndrome PLAN: --continue tube feed --monitor and treat for refeeding syndrome --Family declined G-tube placement, and is in the process of considering stopping tube feeding and transitioning to comfort care   Refeeding syndrome Hypokalemia, hypomagnesemia, hypophosphatemia. --as expected, phos level dropped, as well as mag after initiation tube feed.   PLAN: --daily monitoring of electrolytes --replete with IV phos and IV mag PRN  # Hypoglycemia 2/2 poor oral intake --D5 MIVF d/c'ed after pt started tube feeds --BG q6h to monitor for hypoglycemia --continue tube feed  Anemia due to B12 deficiency and folic acid deficiency. Most likely secondary to  malabsorption.   --continue vit b12 and folate suppl via Dobhoff tube  Mild thrombocytopenia. Follow.  Hx of atrial fibrillation. Not currently on anticoagulation.  --currently in normal sinus  Hx of Essential hypertension.  --BP in acceptable range --No need for BP meds for now.  Urinary retention, not currently active 1 episode.  Currently able to void.  GERD --continue PPI via G-tube  HLD --continue statin   DVT prophylaxis: Lovenox SQ Code Status: DNR   Family Communication: Discussed with palliative care provider and daughter Kristen Richard today about goals of care.  Family is considering comfort care. Status is: inpatient Dispo:   The patient is from: home Anticipated d/c is to: undetermined Anticipated d/c date is: undetermined Patient currently is not medically stable to d/c due to: not eating or drinking, currently on tube feed.  Waiting on family to decide when to stop tube feed and transition to comfort care.   Subjective and Interval History:  Still very lethargic and sleeping most of the time.  Pt declined any oral intake, including ice chips.  Discussed with palliative care provider and daughter Kristen Richard today about goals of care.  Family is considering comfort care.   Objective: Vitals:   07/01/20 1020 07/01/20 1040 07/01/20 1105 07/01/20 1400  BP: (!) 115/102 115/67 117/62   Pulse: (!) 108 93 89 99  Resp: 20 20 18 18   Temp: 97.9 F (36.6 C) 97.9 F (36.6 C) 98 F (36.7 C) 98 F (36.7 C)  TempSrc: Oral Axillary Axillary Axillary  SpO2: 97% 100% 100% 97%  Weight:      Height:        Intake/Output Summary (Last 24 hours) at 07/01/2020 1459  Last data filed at 07/01/2020 1400 Gross per 24 hour  Intake 365 ml  Output 800 ml  Net -435 ml   Filed Weights   06/23/20 1226 06/27/20 0402 06/28/20 0427  Weight: 54.7 kg 53.6 kg 53.6 kg    Examination:   Constitutional: NAD, somnolent, arousable, confused HEENT: conjunctivae and lids normal, EOMI,  mucosa dry CV: RRR no M,R,G. Distal pulses +2.  No cyanosis.   RESP: CTA B/L, normal respiratory effort  GI: +BS, NTND, Dobhoff tube present SKIN: warm, dry and intact, extensive ecchymosis  Psych: Depressed mood and affect.    Data Reviewed: I have personally reviewed following labs and imaging studies  CBC: Recent Labs  Lab 06/25/20 0542 06/26/20 0507 06/27/20 0618 06/28/20 8416 06/29/20 0645 06/30/20 0812 07/01/20 0455  WBC 4.3   < > 8.6 8.7 7.5 13.5* 11.4*  NEUTROABS 3.1  --   --   --   --   --   --   HGB 8.8*   < > 8.8* 8.7* 7.9* 7.2* 6.7*  HCT 25.1*   < > 25.0* 25.3* 25.1* 22.2* 20.3*  MCV 85.1   < > 85.0 87.5 93.3 89.2 88.6  PLT 116*   < > 127* 133* 128* 136* 112*   < > = values in this interval not displayed.   Basic Metabolic Panel: Recent Labs  Lab 06/25/20 0542 06/26/20 0507 06/27/20 0618 06/28/20 6063 06/29/20 0645 06/30/20 0445 07/01/20 0455  NA 133*   < > 133* 136 133* 130* 128*  K 3.0*   < > 4.2 4.3 4.2 4.2 4.7  CL 92*   < > 101 102 98 97* 94*  CO2 29   < > 25 23 23 26 25   GLUCOSE 77   < > 90 105* 116* 137* 95  BUN 11   < > 8 11 15 21 18   CREATININE 0.52   < > 0.58 0.59 0.81 0.61 0.54  CALCIUM 7.8*   < > 7.8* 8.2* 7.9* 7.9* 8.0*  MG 2.0   < > 1.7 1.6* 1.9 1.5* 2.0  PHOS 3.4  --   --  1.6* 2.2* 2.4* 3.3   < > = values in this interval not displayed.   GFR: Estimated Creatinine Clearance: 45.6 mL/min (by C-G formula based on SCr of 0.54 mg/dL). Liver Function Tests: No results for input(s): AST, ALT, ALKPHOS, BILITOT, PROT, ALBUMIN in the last 168 hours. No results for input(s): LIPASE, AMYLASE in the last 168 hours. No results for input(s): AMMONIA in the last 168 hours. Coagulation Profile: No results for input(s): INR, PROTIME in the last 168 hours. Cardiac Enzymes: No results for input(s): CKTOTAL, CKMB, CKMBINDEX, TROPONINI in the last 168 hours. BNP (last 3 results) No results for input(s): PROBNP in the last 8760 hours. HbA1C: No  results for input(s): HGBA1C in the last 72 hours. CBG: Recent Labs  Lab 06/30/20 2127 07/01/20 0103 07/01/20 0441 07/01/20 0734 07/01/20 1135  GLUCAP 104* 93 82 97 97   Lipid Profile: No results for input(s): CHOL, HDL, LDLCALC, TRIG, CHOLHDL, LDLDIRECT in the last 72 hours. Thyroid Function Tests: No results for input(s): TSH, T4TOTAL, FREET4, T3FREE, THYROIDAB in the last 72 hours. Anemia Panel: No results for input(s): VITAMINB12, FOLATE, FERRITIN, TIBC, IRON, RETICCTPCT in the last 72 hours. Sepsis Labs: No results for input(s): PROCALCITON, LATICACIDVEN in the last 168 hours.  Recent Results (from the past 240 hour(s))  SARS Coronavirus 2 by RT PCR (hospital order, performed in [facility]  hospital lab) Nasopharyngeal Nasopharyngeal Swab     Status: None   Collection Time: 06/23/20  2:55 PM   Specimen: Nasopharyngeal Swab  Result Value Ref Range Status   SARS Coronavirus 2 NEGATIVE NEGATIVE Final    Comment: (NOTE) SARS-CoV-2 target nucleic acids are NOT DETECTED.  The SARS-CoV-2 RNA is generally detectable in upper and lower respiratory specimens during the acute phase of infection. The lowest concentration of SARS-CoV-2 viral copies this assay can detect is 250 copies / mL. A negative result does not preclude SARS-CoV-2 infection and should not be used as the sole basis for treatment or other patient management decisions.  A negative result may occur with improper specimen collection / handling, submission of specimen other than nasopharyngeal swab, presence of viral mutation(s) within the areas targeted by this assay, and inadequate number of viral copies (<250 copies / mL). A negative result must be combined with clinical observations, patient history, and epidemiological information.  Fact Sheet for Patients:   BoilerBrush.com.cy  Fact Sheet for Healthcare Providers: https://pope.com/  This test is not yet  approved or  cleared by the [place] FDA and has been authorized for detection and/or diagnosis of SARS-CoV-2 by FDA under an Emergency Use Authorization (EUA).  This EUA will remain in effect (meaning this test can be used) for the duration of the COVID-19 declaration under Section 564(b)(1) of the Act, 21 U.S.C. section 360bbb-3(b)(1), unless the authorization is terminated or revoked sooner.  Performed at [facility], [address] [place], [place] [address]       Radiology Studies: No results found.   Scheduled Meds: . sodium chloride   Intravenous Once  . vitamin C  500 mg Per Tube BID  . atorvastatin  40 mg Per Tube QHS  . cholecalciferol  1,000 Units Per Tube Daily  . docusate  200 mg Per Tube BID  . enoxaparin (LOVENOX) injection  40 mg Subcutaneous Q24H  . folic acid  1 mg Per Tube Daily  . free water  30 mL Per Tube Q4H  . lipase/protease/amylase  24,000 Units Oral TID AC  . mouth rinse  15 mL Mouth Rinse q12n4p  . pantoprazole sodium  40 mg Per Tube Daily  . polyethylene glycol  34 g Per NG tube Q2H  . cyanocobalamin  1,000 mcg Per Tube Daily   Continuous Infusions: . feeding supplement (OSMOLITE 1.2 CAL) 1,000 mL (07/01/20 1422)     LOS: 7 days     Darlin Priestly, MD [facility] If 7PM-7AM, please contact night-coverage 07/01/2020, 2:59 PM

## 2020-07-01 NOTE — Progress Notes (Signed)
OT Cancellation Note  Patient Details Name: Kristen Richard MRN: [id number] DOB: February 20, 1936   Cancelled Treatment:    Reason Eval/Treat Not Completed: Medical issues which prohibited therapy. OT attempt. Pt's hemoglobin is 6.7 and therefore OT intervention contraindicated at this time. OT will follow up when able.   Jackquline Denmark, MS, OTR/L , CBIS ascom [phone]  07/01/20, 2:55 PM  07/01/2020, 2:55 PM

## 2020-07-01 NOTE — Progress Notes (Addendum)
PT Cancellation Note  Patient Details Name: Kristen Richard MRN: [id number] DOB: 07/28/1936   Cancelled Treatment:     PT attempt. HgB 6.7. Will hold PT today per PT protocols. Acute PT will continue to follow per POC and progress as able.    Rushie Chestnut 07/01/2020, 2:15 PM

## 2020-07-02 ENCOUNTER — Telehealth: Payer: Self-pay | Admitting: Cardiology

## 2020-07-02 DIAGNOSIS — I4821 Permanent atrial fibrillation: Secondary | ICD-10-CM

## 2020-07-02 DIAGNOSIS — R638 Other symptoms and signs concerning food and fluid intake: Secondary | ICD-10-CM

## 2020-07-02 DIAGNOSIS — R338 Other retention of urine: Secondary | ICD-10-CM

## 2020-07-02 LAB — CBC
HCT: 25.7 % — ABNORMAL LOW (ref 36.0–46.0)
Hemoglobin: 8.8 g/dL — ABNORMAL LOW (ref 12.0–15.0)
MCH: 28.8 pg (ref 26.0–34.0)
MCHC: 34.2 g/dL (ref 30.0–36.0)
MCV: 84 fL (ref 80.0–100.0)
Platelets: 154 10*3/uL (ref 150–400)
RBC: 3.06 MIL/uL — ABNORMAL LOW (ref 3.87–5.11)
RDW: 15.8 % — ABNORMAL HIGH (ref 11.5–15.5)
WBC: 12 10*3/uL — ABNORMAL HIGH (ref 4.0–10.5)
nRBC: 0.3 % — ABNORMAL HIGH (ref 0.0–0.2)

## 2020-07-02 LAB — TYPE AND SCREEN
ABO/RH(D): O POS
Antibody Screen: NEGATIVE
Unit division: 0

## 2020-07-02 LAB — BASIC METABOLIC PANEL
Anion gap: 9 (ref 5–15)
BUN: 18 mg/dL (ref 8–23)
CO2: 23 mmol/L (ref 22–32)
Calcium: 8.2 mg/dL — ABNORMAL LOW (ref 8.9–10.3)
Chloride: 92 mmol/L — ABNORMAL LOW (ref 98–111)
Creatinine, Ser: 0.5 mg/dL (ref 0.44–1.00)
GFR calc Af Amer: >60 mL/min (ref >60)
GFR calc non Af Amer: >60 mL/min (ref >60)
Glucose, Bld: 100 mg/dL — ABNORMAL HIGH (ref 70–99)
Potassium: 4.6 mmol/L (ref 3.5–5.1)
Sodium: 124 mmol/L — ABNORMAL LOW (ref 135–145)

## 2020-07-02 LAB — GLUCOSE, CAPILLARY
Glucose-Capillary: 104 mg/dL — ABNORMAL HIGH (ref 70–99)
Glucose-Capillary: 104 mg/dL — ABNORMAL HIGH (ref 70–99)
Glucose-Capillary: 92 mg/dL (ref 70–99)
Glucose-Capillary: 93 mg/dL (ref 70–99)

## 2020-07-02 LAB — MAGNESIUM: Magnesium: 1.6 mg/dL — ABNORMAL LOW (ref 1.7–2.4)

## 2020-07-02 LAB — PHOSPHORUS: Phosphorus: 3.1 mg/dL (ref 2.5–4.6)

## 2020-07-02 LAB — BPAM RBC
Blood Product Expiration Date: 202109222359
ISSUE DATE / TIME: 202108241044
Unit Type and Rh: 5100

## 2020-07-02 MED ORDER — LORAZEPAM 1 MG PO TABS: 1.0000 mg | ORAL_TABLET | ORAL | Status: DC | PRN

## 2020-07-02 MED ORDER — MORPHINE SULFATE (CONCENTRATE) 10 MG/0.5ML PO SOLN
5.0000 mg | Freq: Three times a day (TID) | ORAL | Status: DC
  Administered 2020-07-02 – 2020-07-03 (×3): 5 mg via ORAL
  Filled 2020-07-02 (×3): qty 0.5

## 2020-07-02 MED ORDER — CHLORHEXIDINE GLUCONATE CLOTH 2 % EX PADS
6.0000 | MEDICATED_PAD | Freq: Every day | CUTANEOUS | Status: DC
  Administered 2020-07-02 – 2020-07-03 (×2): 6 via TOPICAL

## 2020-07-02 MED ORDER — MORPHINE SULFATE (CONCENTRATE) 10 MG/0.5ML PO SOLN: 5.0000 mg | ORAL | Status: DC | PRN

## 2020-07-02 NOTE — Progress Notes (Signed)
Physical Therapy Discharge Patient Details Name: Kristen Richard MRN: [id number] DOB: 1936-01-31 Today's Date: 07/02/2020 Time:  -     Patient discharged from PT services secondary to going comfort measures. Thanks for consult and please re-order if POC changes.      Rushie Chestnut 07/02/2020, 2:00 PM

## 2020-07-02 NOTE — Progress Notes (Signed)
Patient ID: Kristen Richard, female   DOB: 1936-10-06, 84 y.o.   MRN: [id number]  This NP visited patient at the bedside as a follow up for palliative medicine  needs and emotional support.  Patient remains lethargic.  Refusing p.o.'s even ice chips tube feeds continued via core track.  Refusing to work with therapies,  basically nonverbal.  Now with decreased hemoglobin requiring transfusion.  Family continue to face treatment option decisions and anticipatory care needs.  Spoke to daughter/Kristen Richard by telephone.  Continued conversation regarding current medical situation; treatment option decisions advanced directive decisions and anticipatory care needs addressed.  Created space and opportunity for daughter to explore her thoughts and feelings regarding her mother's current medical situation.  She verbalizes knowing that her mom continues to decline and is unlikely to "bounce back" from all of this"    Education offered on the difference between an aggressive medical intervention path and a palliative comfort path for this patient at this time in this situation.  Education on hospice benefit offered.  Daughter verbalizes receiving "mixed messages" from healthcare providers.  She is confused and "what is the right thing to do", "I need somebody to tell me if doing well this is going to help".    Oversedation has continued regarding PEG placement.  I spoke to Dr. Loetta Rough by telephone verbalizing my belief that artificial feeding at this time would not be beneficial, and in listening to families understanding of what is best for the patient to shift to full comfort would be appropriate..  If the shift to full comfort is made and patient continues with no p.o. intake she would be residential hospice eligible.  Questions and concerns addressed   Will touch base tomorrow with daughter for continued support and help in navigating healthcare decisions.  Discussed with Dr Fran Lowes and Stephani/CMRN  Total time  spent on the unit was 35 minutes   Greater than 50% of the time was spent in counseling and coordination of care  Lorinda Creed NP  Palliative Medicine Team Team Phone # [phone] Pager [phone]

## 2020-07-02 NOTE — Telephone Encounter (Signed)
-----   Message from Andi Devon sent at 07/02/2020 11:49 AM EDT ----- Regarding: echocardiogram Patient has scheduled appointment with Dr. Azucena Cecil after echo but I do not see that the echo has been scheduled. Will you please check into this? Thank you!

## 2020-07-02 NOTE — Progress Notes (Signed)
[facility] Liaison note:  New referral for [facility] hospice home received from [facility]. Hospice home eligibility confirmed.  Writer spoke via telephone to patient's daughter [name] to initiate education regarding hospice home services, philosophy, team approach to care and current visitation policy with understanding voiced. [facility] is able to offer a bed tomorrow 8/26. [name] is aware. Hospital care team updated via Epic chat. Patient will require non-emergent transport with signed out of facility DNR in place. Writer to follow up with hospital team in the morning. Thank you for the opportunity to be involved in the care of this patient and her family. Dayna Barker BSN, RN, [facility] [employer] [phone]

## 2020-07-02 NOTE — Progress Notes (Addendum)
Patient ID: Kristen Richard, female   DOB: November 06, 1936, 84 y.o.   MRN: [id number]  This NP reviewed medical records, discussed with team as a follow up for palliative medicine  needs and emotional support.  Patient remains lethargic.  Refusing p.o.'s even ice chips tube feeds continued via core track.  Refusing to work with therapies,  basically nonverbal.  Failing to thrive  Family continue to face treatment option decisions and anticipatory care needs.  Spoke to daughter/[name] by telephone.  Continued conversation regarding current medical situation; treatment option decisions advanced directive decisions and anticipatory care needs addressed.  Education offered on the difference between an aggressive medical intervention path and a palliative comfort path for this patient at this time in this situation.  Education on hospice benefit offered.  Education offered on the natural trajectory and expectations at end of life.   Plan of care; -DNR/DNI -No artificial feeding or hydration now or in the future/discontinue core track/comfort feeds as tolerated -Comfort and dignity are the focus of care allowing for a natural death -No further diagnostics -Symptom management       -Roxanol 5 mg po/sl every 8hrs for generalized underlying  pain  -Family hopeful to transition to residential hospice for end-of-life care/prognosis is less than 2 weeks    PPS 20 %  Questions and concerns addressed     Discussed with Dr Chipper Herb and Stephani/CMRN via secure chat  Total time spent on the unit was 35 minutes   Greater than 50% of the time was spent in counseling and coordination of care  Lorinda Creed NP  Palliative Medicine Team Team Phone # [phone] Pager [phone]

## 2020-07-02 NOTE — Progress Notes (Signed)
PROGRESS NOTE    Kristen Richard  ZMO:[id number] DOB: [birth year] DOA: 06/23/2020 PCP: [facility], [facility]   Brief Narrative:  Kristen Richard a 84 y.o.Caucasian femalewith medical history significant forHistory of persistent atrial fibrillation not on anticoagulation due to frequent falls, history of sinus pause from beta-blocker, hypertension, lower extremity edema who presents with worsening weakness and confusion. Patient had a significant nausea vomiting, occasional diarrhea, poor appetite.   Assessment & Plan:   Active Problems:   Pressure injury of skin   Hypertension   Hypokalemia   Weakness   Acute metabolic encephalopathy   Failure to thrive in adult   Persistent atrial fibrillation (HCC)   Anemia   Thrombocytopenia (HCC)   Protein-calorie malnutrition, severe   Palliative care by specialist   DNR (do not resuscitate)   Decreased oral intake  Patient is comfort care only.  Currently comfortable, limited p.o. intake.  Patient has been evaluated by hospice, planning for home hospice.   Subjective: Patient is a very confused, but no discomfort.  Dobbhoff catheter is removed.  Patient has very little p.o. intake.  Objective: Vitals:   07/01/20 2029 07/02/20 0559 07/02/20 1000 07/02/20 1219  BP: (!) 132/59 140/81  (!) 146/88  Pulse: (!) 103 (!) 108  94  Resp: 18 18  16   Temp: 98.7 F (37.1 C) 98.1 F (36.7 C)  97.7 F (36.5 C)  TempSrc: Oral Oral  Oral  SpO2: 99% 98%  100%  Weight:   58.8 kg   Height:        Intake/Output Summary (Last 24 hours) at 07/02/2020 1449 Last data filed at 07/02/2020 1350 Gross per 24 hour  Intake 2579.5 ml  Output 2401 ml  Net 178.5 ml   Filed Weights   06/28/20 0427 07/01/20 1215 07/02/20 1000  Weight: 53.6 kg 59.9 kg 58.8 kg    Examination:  General exam: Appears calm and comfortable  Respiratory system: Clear to auscultation. Respiratory effort normal. Cardiovascular system: Regular. No JVD,  murmurs Gastrointestinal system: Abdomen is nondistended, soft and nontender.  Central nervous system: Alert and oriented x1. No focal neurological deficits. Extremities: No edema     Data Reviewed: I have personally reviewed following labs and imaging studies  CBC: Recent Labs  Lab 06/28/20 0621 06/29/20 0645 06/30/20 0812 07/01/20 0455 07/02/20 0530  WBC 8.7 7.5 13.5* 11.4* 12.0*  HGB 8.7* 7.9* 7.2* 6.7* 8.8*  HCT 25.3* 25.1* 22.2* 20.3* 25.7*  MCV 87.5 93.3 89.2 88.6 84.0  PLT 133* 128* 136* 112* 154   Basic Metabolic Panel: Recent Labs  Lab 06/28/20 0621 06/29/20 0645 06/30/20 0445 07/01/20 0455 07/02/20 0530  NA 136 133* 130* 128* 124*  K 4.3 4.2 4.2 4.7 4.6  CL 102 98 97* 94* 92*  CO2 23 23 26 25 23   GLUCOSE 105* 116* 137* 95 100*  BUN 11 15 21 18 18   CREATININE 0.59 0.81 0.61 0.54 0.50  CALCIUM 8.2* 7.9* 7.9* 8.0* 8.2*  MG 1.6* 1.9 1.5* 2.0 1.6*  PHOS 1.6* 2.2* 2.4* 3.3 3.1   GFR: Estimated Creatinine Clearance: 45.6 mL/min (by C-G formula based on SCr of 0.5 mg/dL). Liver Function Tests: No results for input(s): AST, ALT, ALKPHOS, BILITOT, PROT, ALBUMIN in the last 168 hours. No results for input(s): LIPASE, AMYLASE in the last 168 hours. No results for input(s): AMMONIA in the last 168 hours. Coagulation Profile: No results for input(s): INR, PROTIME in the last 168 hours. Cardiac Enzymes: No results for input(s): CKTOTAL, CKMB, CKMBINDEX, TROPONINI  in the last 168 hours. BNP (last 3 results) No results for input(s): PROBNP in the last 8760 hours. HbA1C: No results for input(s): HGBA1C in the last 72 hours. CBG: Recent Labs  Lab 07/01/20 2033 07/02/20 0018 07/02/20 0416 07/02/20 0740 07/02/20 1148  GLUCAP 107* 104* 104* 93 92   Lipid Profile: No results for input(s): CHOL, HDL, LDLCALC, TRIG, CHOLHDL, LDLDIRECT in the last 72 hours. Thyroid Function Tests: No results for input(s): TSH, T4TOTAL, FREET4, T3FREE, THYROIDAB in the last 72  hours. Anemia Panel: No results for input(s): VITAMINB12, FOLATE, FERRITIN, TIBC, IRON, RETICCTPCT in the last 72 hours. Sepsis Labs: No results for input(s): PROCALCITON, LATICACIDVEN in the last 168 hours.  Recent Results (from the past 240 hour(s))  SARS Coronavirus 2 by RT PCR (hospital order, performed in [facility] hospital lab) Nasopharyngeal Nasopharyngeal Swab     Status: None   Collection Time: 06/23/20  2:55 PM   Specimen: Nasopharyngeal Swab  Result Value Ref Range Status   SARS Coronavirus 2 NEGATIVE NEGATIVE Final    Comment: (NOTE) SARS-CoV-2 target nucleic acids are NOT DETECTED.  The SARS-CoV-2 RNA is generally detectable in upper and lower respiratory specimens during the acute phase of infection. The lowest concentration of SARS-CoV-2 viral copies this assay can detect is 250 copies / mL. A negative result does not preclude SARS-CoV-2 infection and should not be used as the sole basis for treatment or other patient management decisions.  A negative result may occur with improper specimen collection / handling, submission of specimen other than nasopharyngeal swab, presence of viral mutation(s) within the areas targeted by this assay, and inadequate number of viral copies (<250 copies / mL). A negative result must be combined with clinical observations, patient history, and epidemiological information.  Fact Sheet for Patients:   BoilerBrush.com.cy  Fact Sheet for Healthcare Providers: https://pope.com/  This test is not yet approved or  cleared by the [place] FDA and has been authorized for detection and/or diagnosis of SARS-CoV-2 by FDA under an Emergency Use Authorization (EUA).  This EUA will remain in effect (meaning this test can be used) for the duration of the COVID-19 declaration under Section 564(b)(1) of the Act, 21 U.S.C. section 360bbb-3(b)(1), unless the authorization is terminated or revoked  sooner.  Performed at [facility], [address] [place], [place] [address]          Radiology Studies: No results found.      Scheduled Meds:  sodium chloride   Intravenous Once   Chlorhexidine Gluconate Cloth  6 each Topical Daily   mouth rinse  15 mL Mouth Rinse q12n4p   Continuous Infusions:   LOS: 8 days    Time spent: 26 minutes    Marrion Coy, MD [facility]   To contact the attending provider between 7A-7P or the covering provider during after hours 7P-7A, please log into the web site [email or web address] and access using universal [place] password for that web site. If you do not have the password, please call the hospital operator.  07/02/2020, 2:49 PM

## 2020-07-02 NOTE — Treatment Plan (Signed)
Pt noted to be moving in bed and noted new skin tear to the left lower leg and the left arm. Pt appeared to be scratching herself when Clinical research associate walked by. Attempted to reposition patient from the left side and patient started to get combative and starting swinging arms. Writer was able to place mepilex on arm and leg at this time. Non restraint mittens placed on patient at this time. Arm also wrapped in kerlix to prevent further injury. Bed alarm placed on patient at this time.

## 2020-07-02 NOTE — TOC Progression Note (Signed)
Transition of Care [facility]) - Progression Note    Patient Details  Name: Meliza Kage MRN: [id number] Date of Birth: January 29, 1936  Transition of Care [facility]) CM/SW Contact  Chapman Fitch, RN Phone Number: 07/02/2020, 2:50 PM  Clinical Narrative:     Patient is now comfort care. TOC confirmed with daughter her wishes are for Hospice Home in [place],  Referral made to Clydie Braun with Civil engineer, contracting    Expected Discharge Plan: Hospice Medical Facility Barriers to Discharge: No Barriers Identified  Expected Discharge Plan and Services Expected Discharge Plan: Hospice Medical Facility   Discharge Planning Services: CM Consult   Living arrangements for the past 2 months: Single Family Home                                       Social Determinants of Health (SDOH) Interventions    Readmission Risk Interventions Readmission Risk Prevention Plan 06/25/2020  Transportation Screening Complete  HRI or Home Care Consult Complete  Medication Review (RN Care Manager) Complete  Some recent data might be hidden

## 2020-07-02 NOTE — Telephone Encounter (Signed)
Attempted to contact about echo   No ans no vm

## 2020-07-02 NOTE — Treatment Plan (Signed)
Tele d/c Christy notified via phone call

## 2020-07-03 MED ORDER — ORAL CARE MOUTH RINSE: 15.0000 mL | Freq: Two times a day (BID) | OROMUCOSAL | 0 refills | Status: AC

## 2020-07-03 MED ORDER — MORPHINE SULFATE (CONCENTRATE) 10 MG/0.5ML PO SOLN
5.0000 mg | Freq: Three times a day (TID) | ORAL | Status: AC
Start: 2020-07-03 — End: ?

## 2020-07-03 MED ORDER — MORPHINE SULFATE (CONCENTRATE) 10 MG/0.5ML PO SOLN
5.0000 mg | ORAL | Status: AC | PRN
Start: 2020-07-03 — End: ?

## 2020-07-03 MED ORDER — ALUM & MAG HYDROXIDE-SIMETH 200-200-20 MG/5ML PO SUSP: 15.0000 mL | Freq: Four times a day (QID) | ORAL | 0 refills | Status: AC | PRN

## 2020-07-03 MED ORDER — LORAZEPAM 1 MG PO TABS: 1.0000 mg | ORAL_TABLET | ORAL | 0 refills | Status: AC | PRN

## 2020-07-03 NOTE — Care Management Important Message (Signed)
Important Message  Patient Details  Name: Kristen Richard MRN: [id number] Date of Birth: [birth year]   Medicare Important Message Given:  No  Patient discharged prior to arrival to unit to deliver concurrent Medicare IM.    [name] 07/03/2020, 3:12 PM

## 2020-07-03 NOTE — Treatment Plan (Signed)
Dentures and gown sent with patient via ems.

## 2020-07-03 NOTE — TOC Transition Note (Signed)
Transition of Care [facility]) - CM/SW Discharge Note   Patient Details  Name: Kristen Richard MRN: [id number] Date of Birth: 1936/05/16  Transition of Care [facility]) CM/SW Contact:  Chapman Fitch, RN Phone Number: 07/03/2020, 11:35 AM   Clinical Narrative:     Patient to discharge to inpatient hospice facility in [place] today  EMS packet printed Clydie Braun with [employer] has updated family, called report, and arranged EMS  Final next level of care: Hospice Medical Facility Barriers to Discharge: No Barriers Identified   Patient Goals and CMS Choice        Discharge Placement                Patient to be transferred to facility by: ems Name of family member notified: [facility]    Discharge Plan and Services   Discharge Planning Services: CM Consult                                 Social Determinants of Health (SDOH) Interventions     Readmission Risk Interventions Readmission Risk Prevention Plan 06/25/2020  Transportation Screening Complete  HRI or Home Care Consult Complete  Medication Review (RN Care Manager) Complete  Some recent data might be hidden

## 2020-07-03 NOTE — Progress Notes (Signed)
[facility] Liaison note:  Follow up visit made to new referral for [facility]. Patient seen lying in bed, does respond to voice, some oral secretions noted. Plan is for transfer to the hospice home this morning. Hospital care team updated. Report called to the hospice home. EMS notified for transport. Thank you Dayna Barker BSN, RN, [facility] Liaison [employer] [phone]

## 2020-07-03 NOTE — Discharge Summary (Signed)
Physician Discharge Summary  Patient ID: Kristen Richard MRN: [id number] DOB/AGE: 02-26-1936 84 y.o.  Admit date: 06/23/2020 Discharge date: 07/03/2020  Admission Diagnoses:  Discharge Diagnoses:  Active Problems:   Pressure injury of skin   Hypertension   Hypokalemia   Weakness   Acute metabolic encephalopathy   Failure to thrive in adult   Persistent atrial fibrillation (HCC)   B12 deficiency anemia   Thrombocytopenia (HCC)   Protein-calorie malnutrition, severe   Palliative care by specialist   DNR (do not resuscitate)   Decreased oral intake   Permanent atrial fibrillation (HCC)   Acute urinary retention Hypoglycemia secondary to poor p.o. intake Hypomagnesemia and hypophosphatemia Anemia secondary to B12 deficiency and folic acid deficiency Gastroesophageal reflux disease.   Discharged Condition: poor  Hospital Course:  Kristen Richard a 84 y.o.Caucasian femalewith medical history significant forHistory of persistent atrial fibrillation not on anticoagulation due to frequent falls, history of sinus pause from beta-blocker, hypertension, lower extremity edema who presents with worsening weakness and confusion. Patient had a significant nausea vomiting, occasional diarrhea, poor appetite.  Patient has been treated in the hospital, but condition does not improve.  Family had decided to have comfort care only.  Patient has been accepted to inpatient hospice.  Will be transferred today.  Consults: palliative care  Significant Diagnostic Studies:   Treatments: Supportive care  Discharge Exam: Blood pressure (!) 132/59, pulse 84, temperature (!) 97.5 F (36.4 C), temperature source Axillary, resp. rate 16, height 5\' 3"  (1.6 m), weight 58.8 kg, SpO2 100 %. General appearance: Drowsy and confused. Resp: Decreased breathing sounds. Cardio: Regular and no murmurs GI: soft, non-tender; bowel sounds normal; no masses,  no organomegaly Extremities: No edema.  Disposition:  Discharge disposition: 82-Hospice/Medical Facility       Discharge Instructions    Diet general   Complete by: As directed    As tolerated.   Increase activity slowly   Complete by: As directed    No wound care   Complete by: As directed      Allergies as of 07/03/2020      Reactions   Sulfa Antibiotics Swelling   Pt was told it caused her brain to swell - [facility] documented in 2014, pt is unaware of this    Acetazolamide Nausea And Vomiting   Dizziness    Hydrochlorothiazide       Medication List    STOP taking these medications   atorvastatin 40 MG tablet Commonly known as: LIPITOR   bimatoprost 0.03 % ophthalmic solution Commonly known as: LUMIGAN   cholecalciferol 25 MCG (1000 UNIT) tablet Commonly known as: VITAMIN D3   cyanocobalamin 1000 MCG tablet   docusate sodium 100 MG capsule Commonly known as: COLACE   folic acid 1 MG tablet Commonly known as: FOLVITE   furosemide 20 MG tablet Commonly known as: LASIX   magnesium oxide 400 (241.3 Mg) MG tablet Commonly known as: MAG-OX   multivitamin with minerals Tabs tablet   ondansetron 4 MG disintegrating tablet Commonly known as: ZOFRAN-ODT   pantoprazole 40 MG tablet Commonly known as: PROTONIX   phosphorus 155-852-130 MG tablet Commonly known as: K PHOS NEUTRAL   polyethylene glycol powder 17 GM/SCOOP powder Commonly known as: GLYCOLAX/MIRALAX     TAKE these medications   acetaminophen 325 MG tablet Commonly known as: TYLENOL Take 325-650 mg by mouth every 6 (six) hours as needed for moderate pain or headache.   alum & mag hydroxide-simeth 200-200-20 MG/5ML suspension Commonly known as: MAALOX/MYLANTA Take 15  mLs by mouth every 6 (six) hours as needed for indigestion or heartburn.   LORazepam 1 MG tablet Commonly known as: ATIVAN Take 1 tablet (1 mg total) by mouth every 4 (four) hours as needed for anxiety or sleep.   morphine CONCENTRATE 10 MG/0.5ML Soln concentrated solution Take 0.25  mLs (5 mg total) by mouth every 2 (two) hours as needed for moderate pain or shortness of breath.   morphine CONCENTRATE 10 MG/0.5ML Soln concentrated solution Take 0.25 mLs (5 mg total) by mouth every 8 (eight) hours.   mouth rinse Liqd solution 15 mLs by Mouth Rinse route 2 times daily at 12 noon and 4 pm.        Signed: Marrion Coy 07/03/2020, 10:27 AM

## 2020-07-03 NOTE — Treatment Plan (Signed)
AVS printed and placed in packet. Pts gown changed at this time.

## 2020-07-03 NOTE — Plan of Care (Signed)

## 2020-07-07 ENCOUNTER — Ambulatory Visit: Payer: Medicare HMO | Admitting: Cardiology

## 2020-07-17 ENCOUNTER — Ambulatory Visit: Payer: Medicare HMO | Admitting: Gastroenterology

## 2020-08-08 DEATH — deceased

## 2021-05-10 IMAGING — DX DG ABDOMEN ACUTE W/ 1V CHEST
3 series · 3 of 3 positions shown · non-contrast
Comparison: Chest radiograph December 12, 2010

CLINICAL DATA: Vomiting and constipation.

EXAM:
DG ABDOMEN ACUTE W/ 1V CHEST

[abdomen supine]
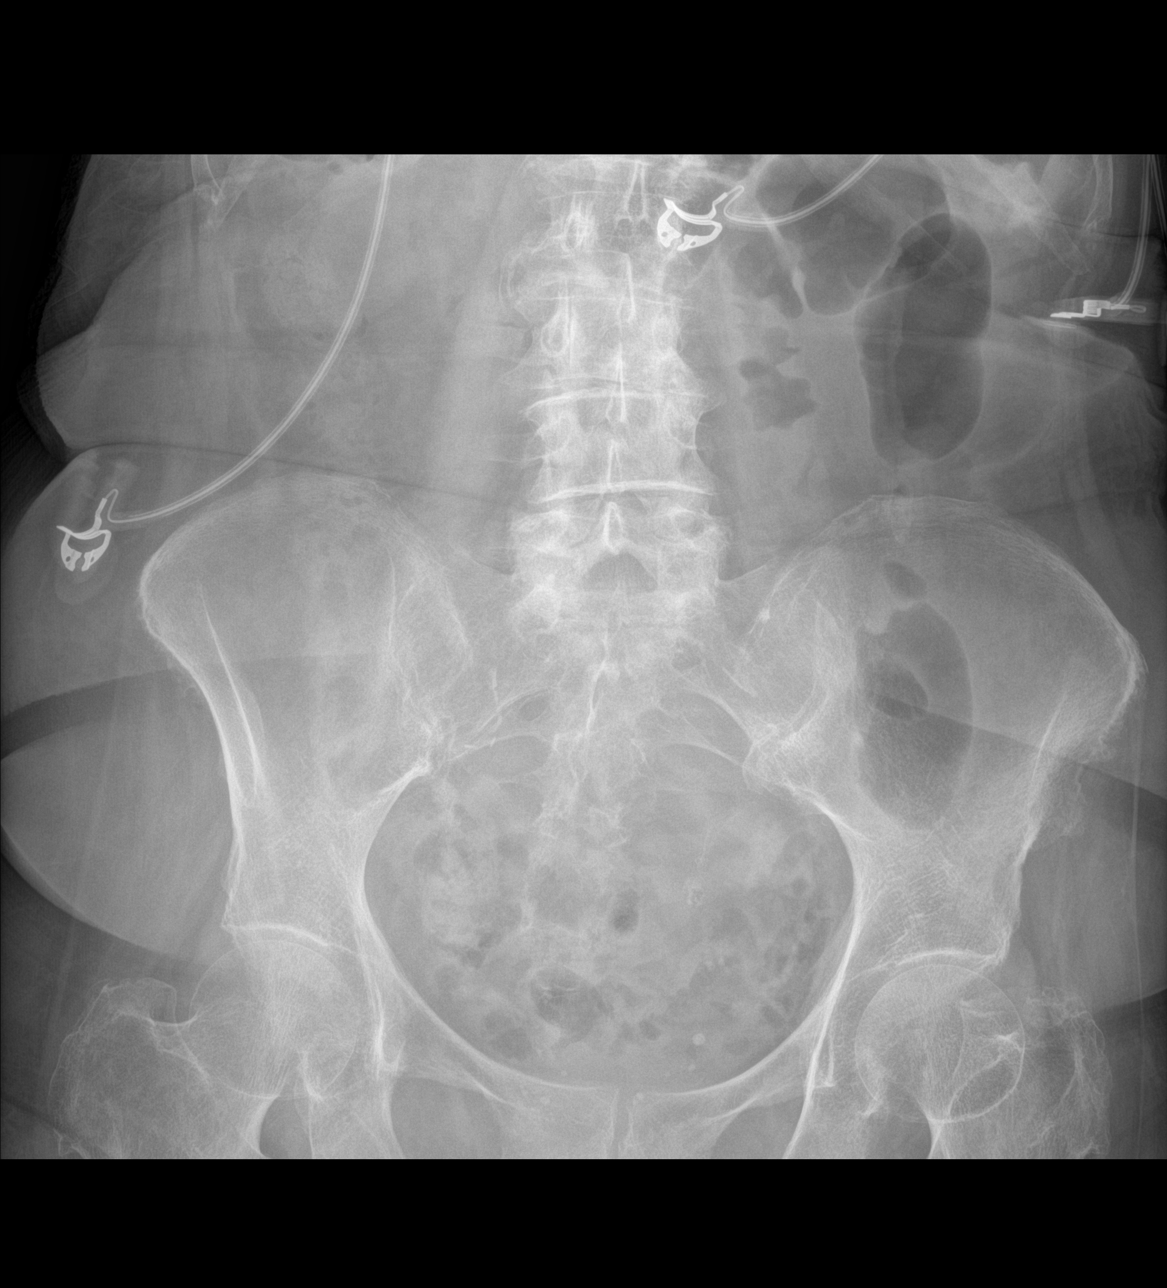

[abdomen erect]
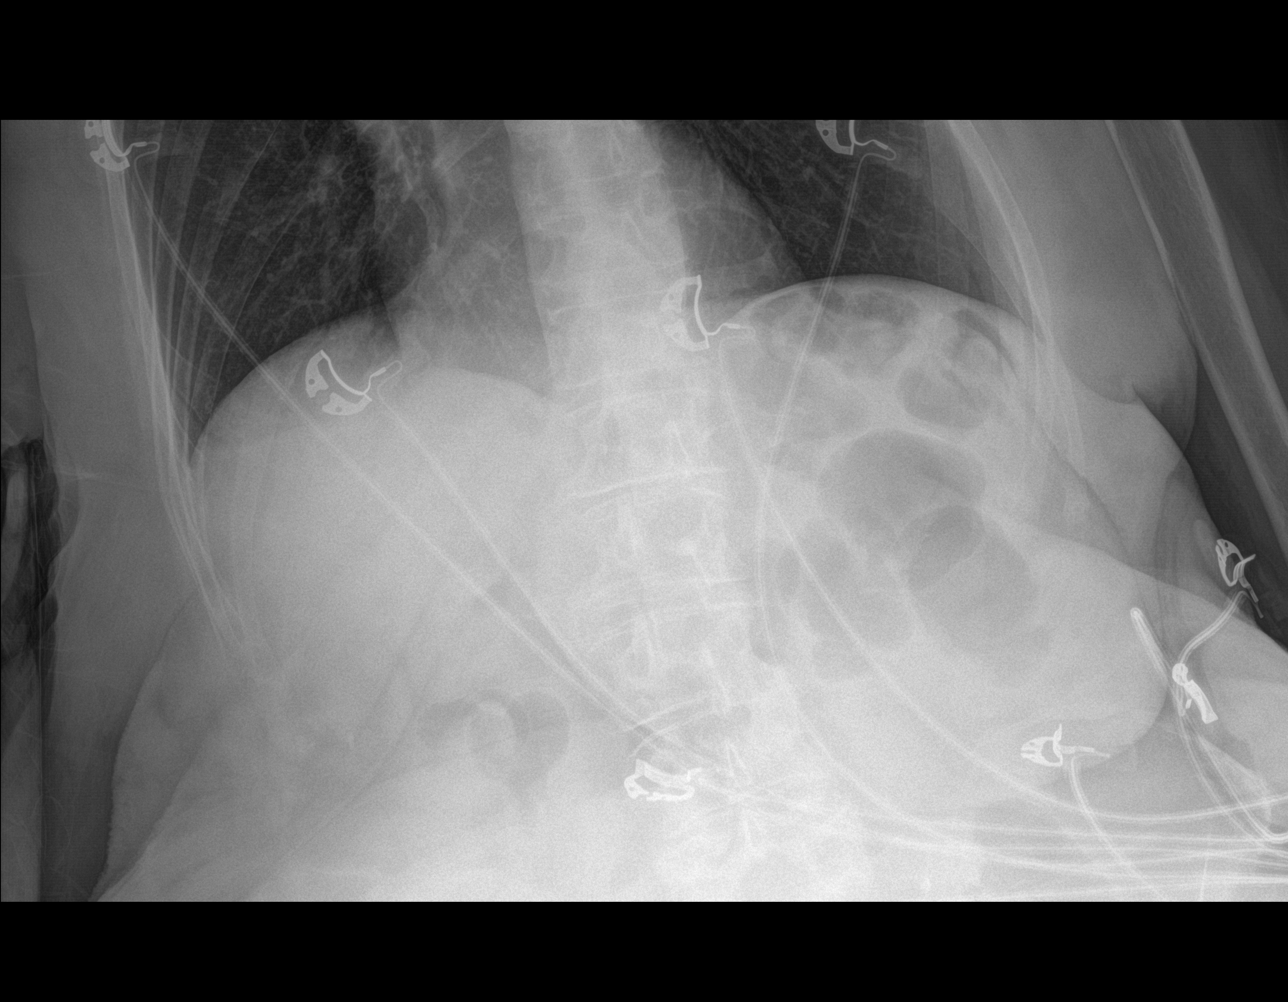

[chest ap]
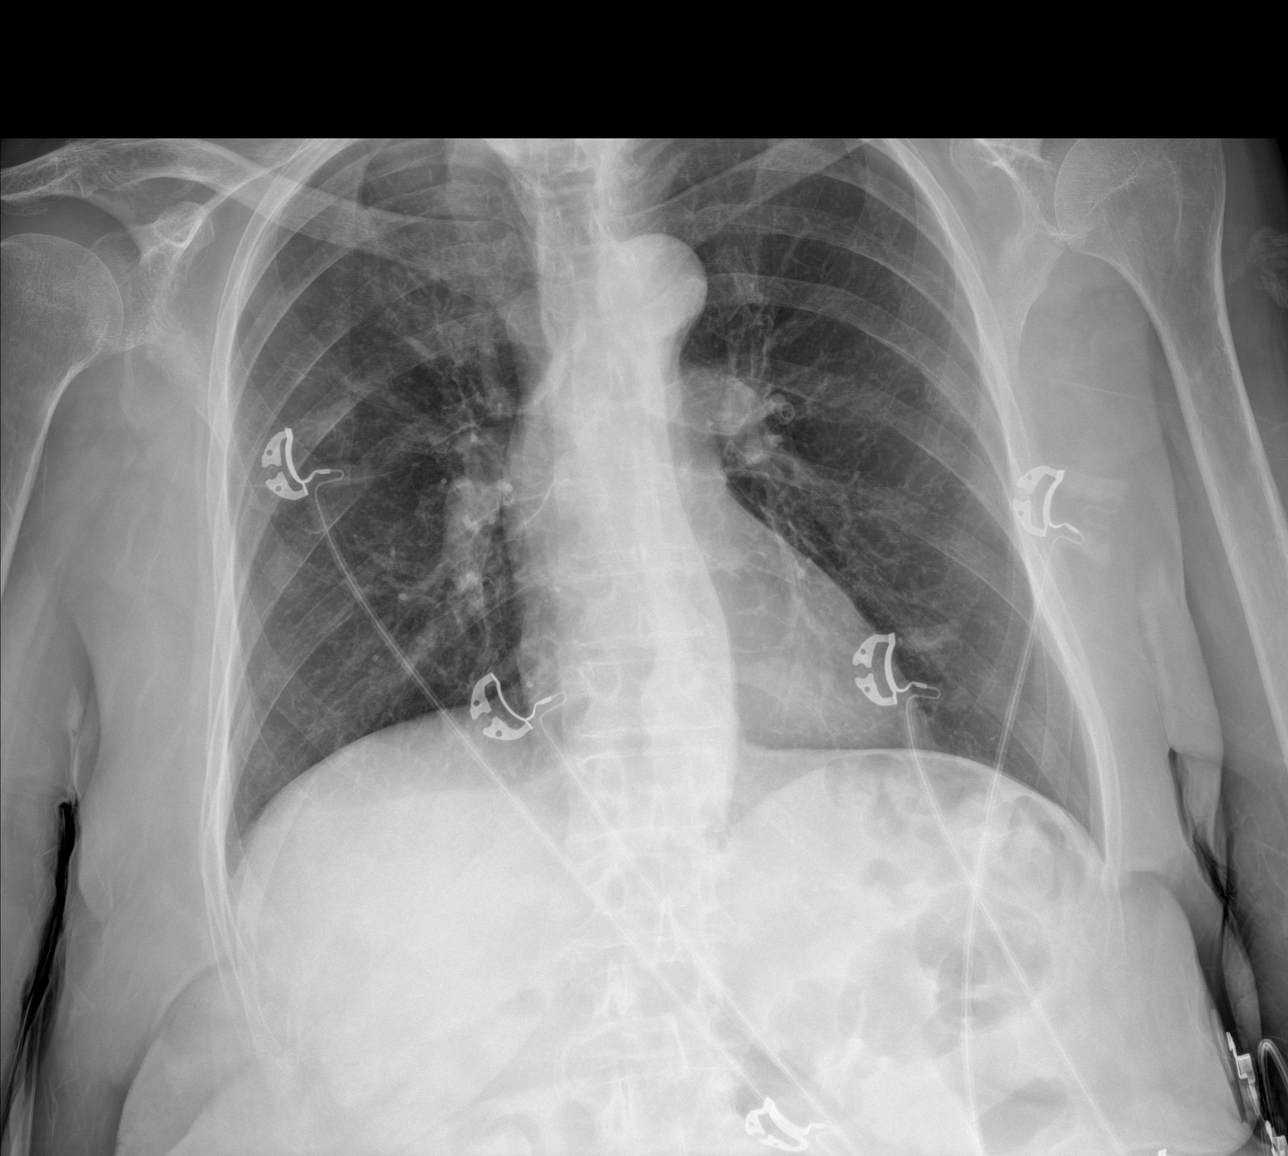

[3 of 3 positions shown; findings below may reference images not displayed]

FINDINGS: PA chest: Lungs are clear. Heart size and pulmonary vascularity are
normal. No adenopathy.

Supine and upright abdomen: There is moderate stool in the colon.
There is no bowel dilatation or air-fluid level to suggest bowel
obstruction. No free air. There are parent phleboliths in the
pelvis. There is mid lumbar dextroscoliosis with degenerative change
in the lumbar spine.
IMPRESSION: No bowel obstruction or free air. Moderate stool in colon. Lungs
clear.

## 2021-10-17 IMAGING — US US ABDOMEN LIMITED
1 series · 14 of 25 positions shown · non-contrast
Comparison: December 25, 2019.

CLINICAL DATA: Acute right upper quadrant abdominal pain.

EXAM:
ULTRASOUND ABDOMEN LIMITED RIGHT UPPER QUADRANT

[Series 1: us abdomen limited ruq · 14 of 37 slices shown]
[im 1/37]
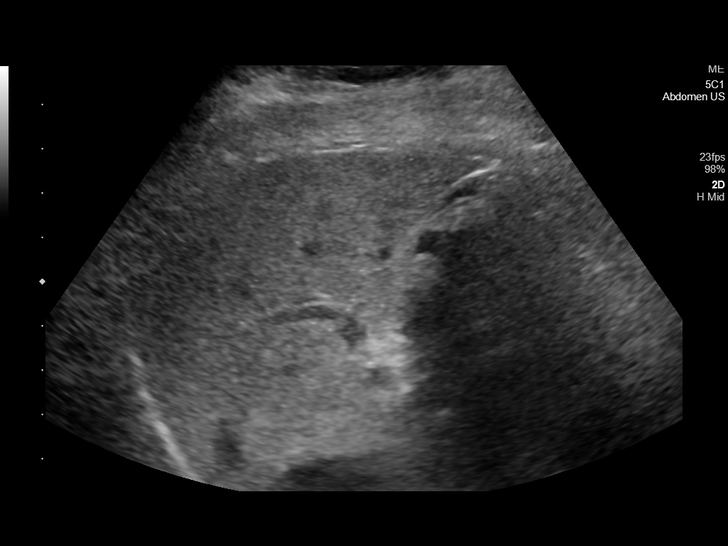
[im 4/37]
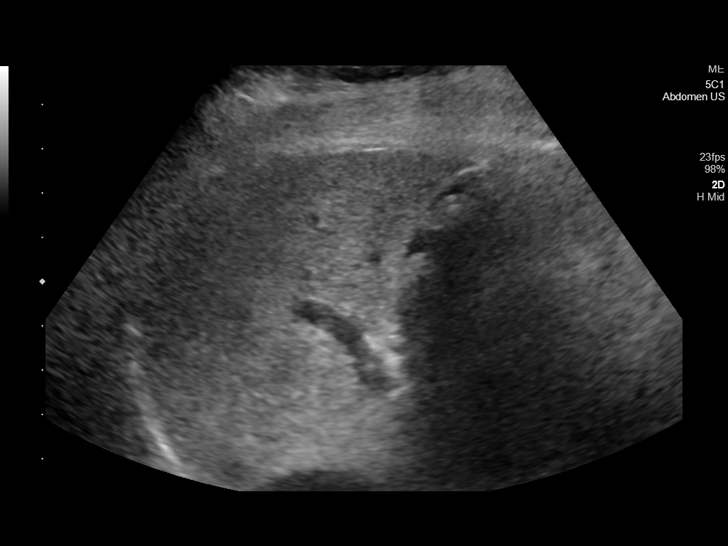
[im 7/37]
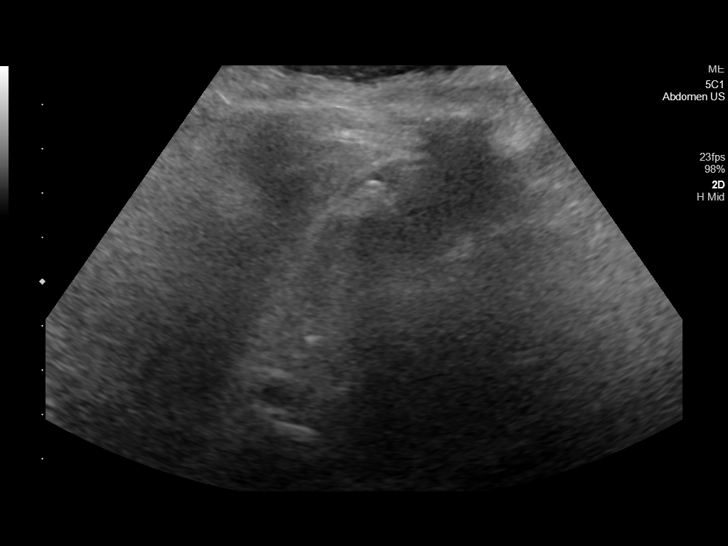
[im 10/37]
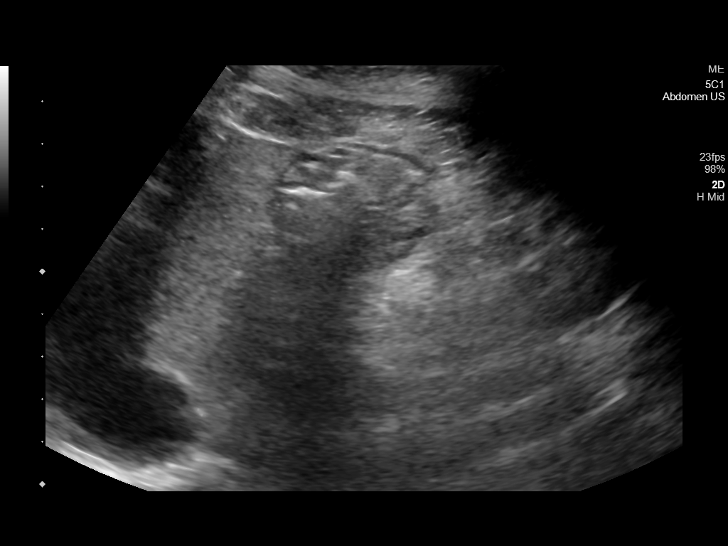
[im 13/37]
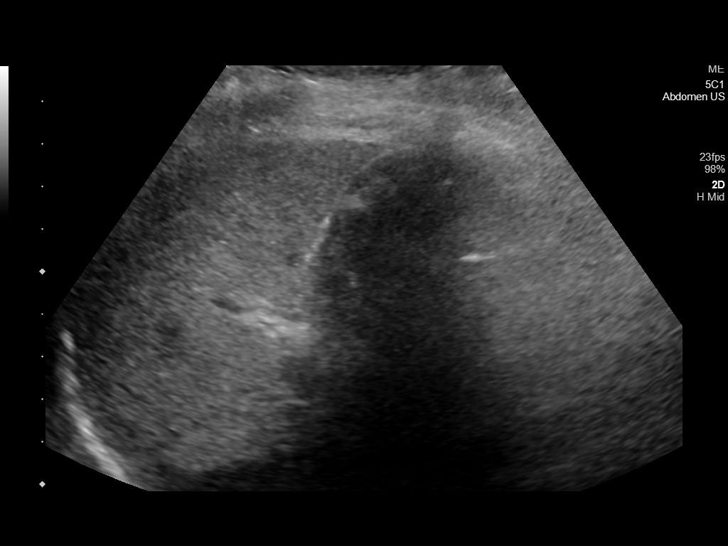
[im 14/37]
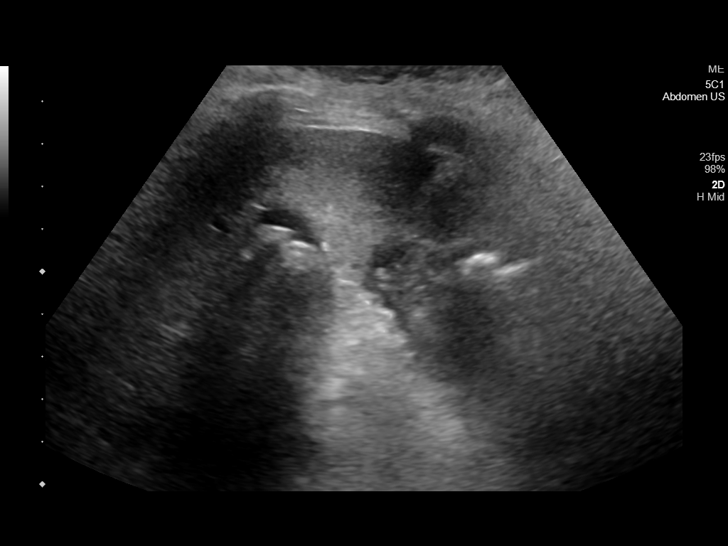
[im 17/37]
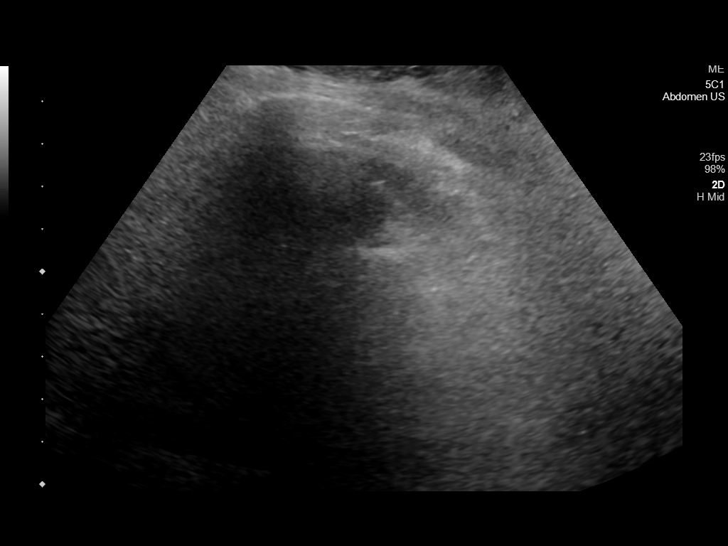
[im 20/37]
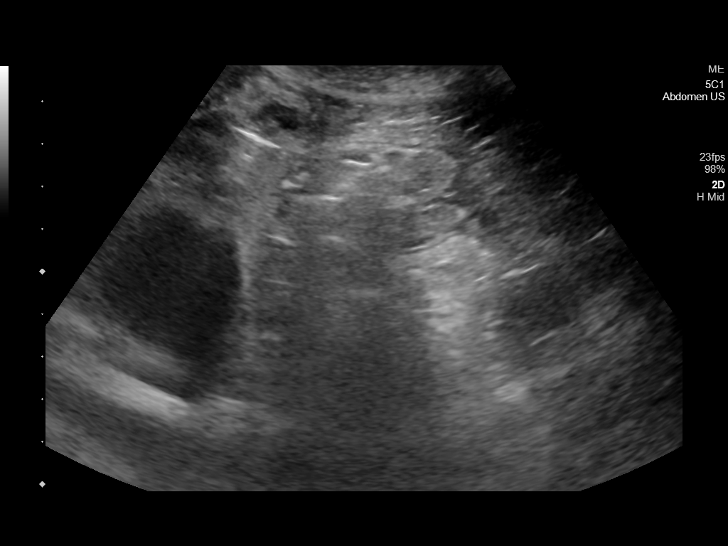
[im 23/37]
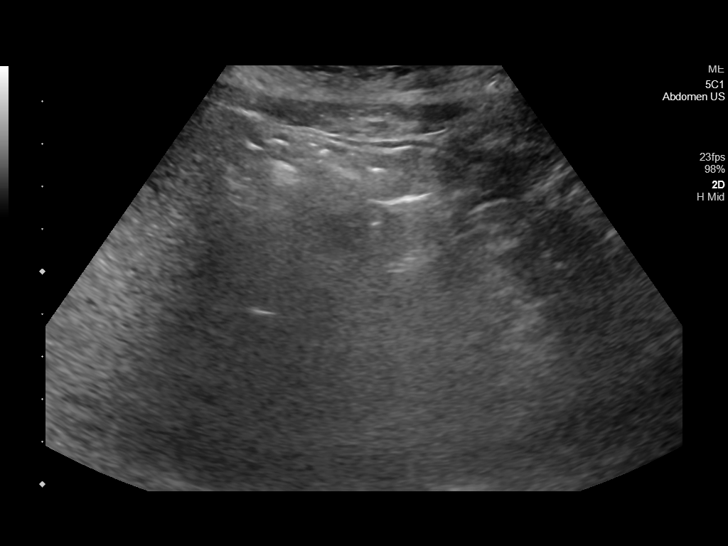
[im 25/37]
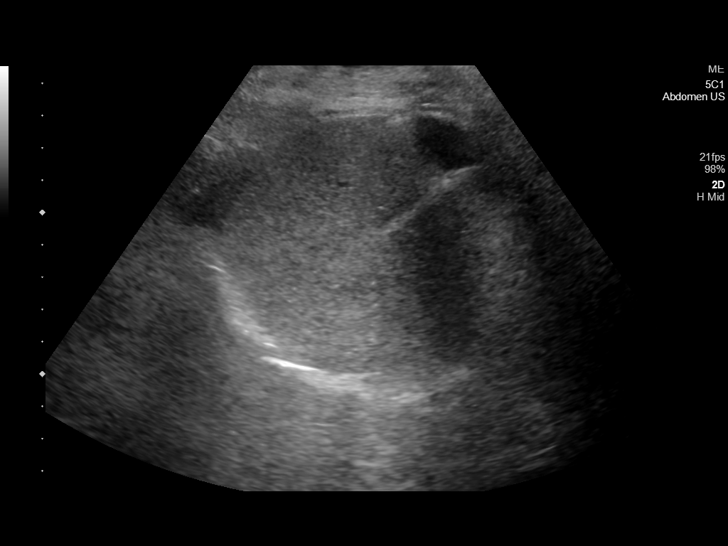
[im 28/37]
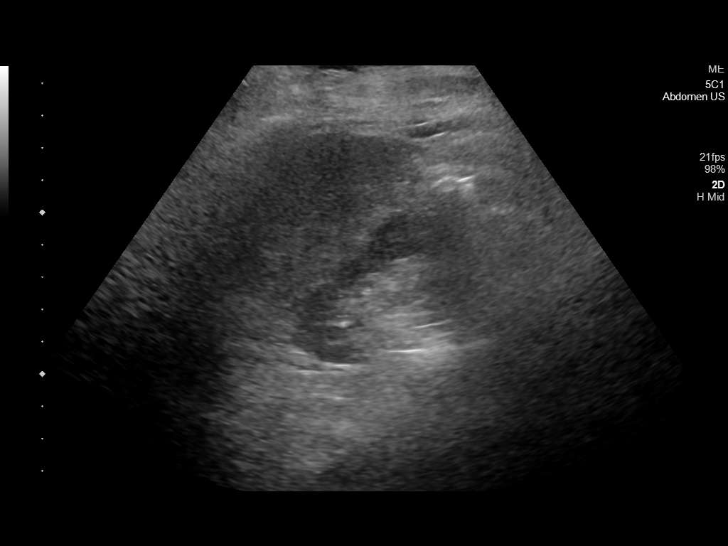
[im 31/37]
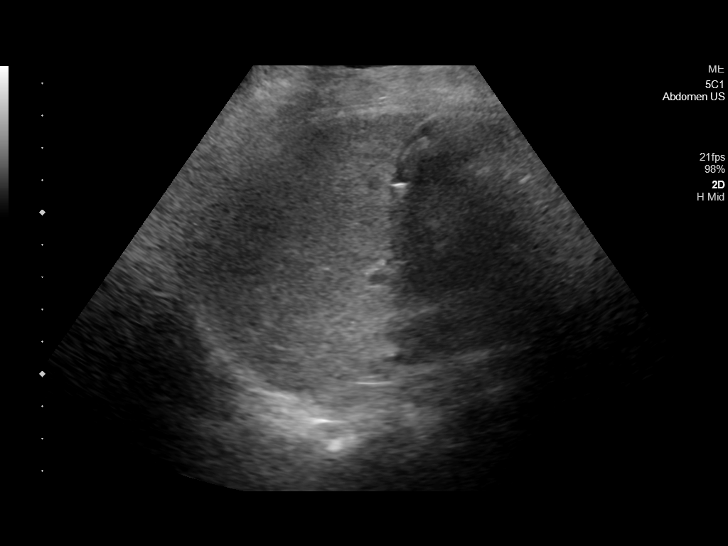
[im 34/37]
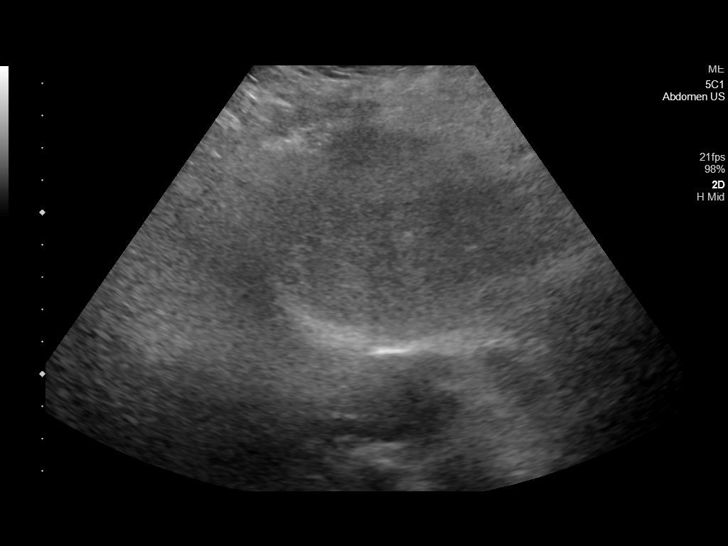
[im 37/37]
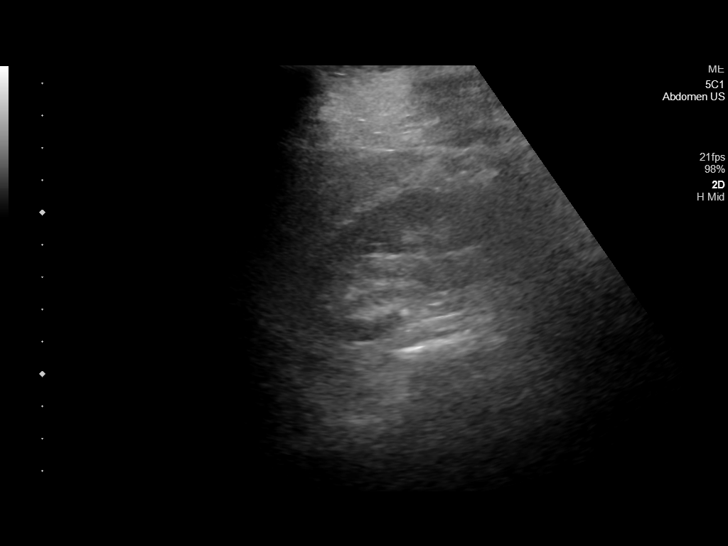

[14 of 25 positions shown; findings below may reference images not displayed]

FINDINGS: Gallbladder:

Cholelithiasis is noted without gallbladder wall thickening or
pericholecystic fluid. No sonographic Murphy's sign is noted.

Common bile duct:

Diameter: 4 mm which is within normal limits.

Liver:

No focal lesion identified. Increased echogenicity of hepatic
parenchyma is noted suggesting hepatic steatosis or other diffuse
hepatocellular disease. Portal vein is patent on color Doppler
imaging with normal direction of blood flow towards the liver.

Other: None.
IMPRESSION: Cholelithiasis without evidence of cholecystitis. Increased
echogenicity of hepatic parenchyma is noted suggesting hepatic
steatosis or other diffuse hepatocellular disease.

## 2021-11-07 IMAGING — DX DG PORTABLE PELVIS
1 series · 1 of 1 positions shown · non-contrast
Comparison: None

CLINICAL DATA: BILATERAL hip pain

EXAM:
PORTABLE PELVIS 1-2 VIEWS

[pelvis ap]
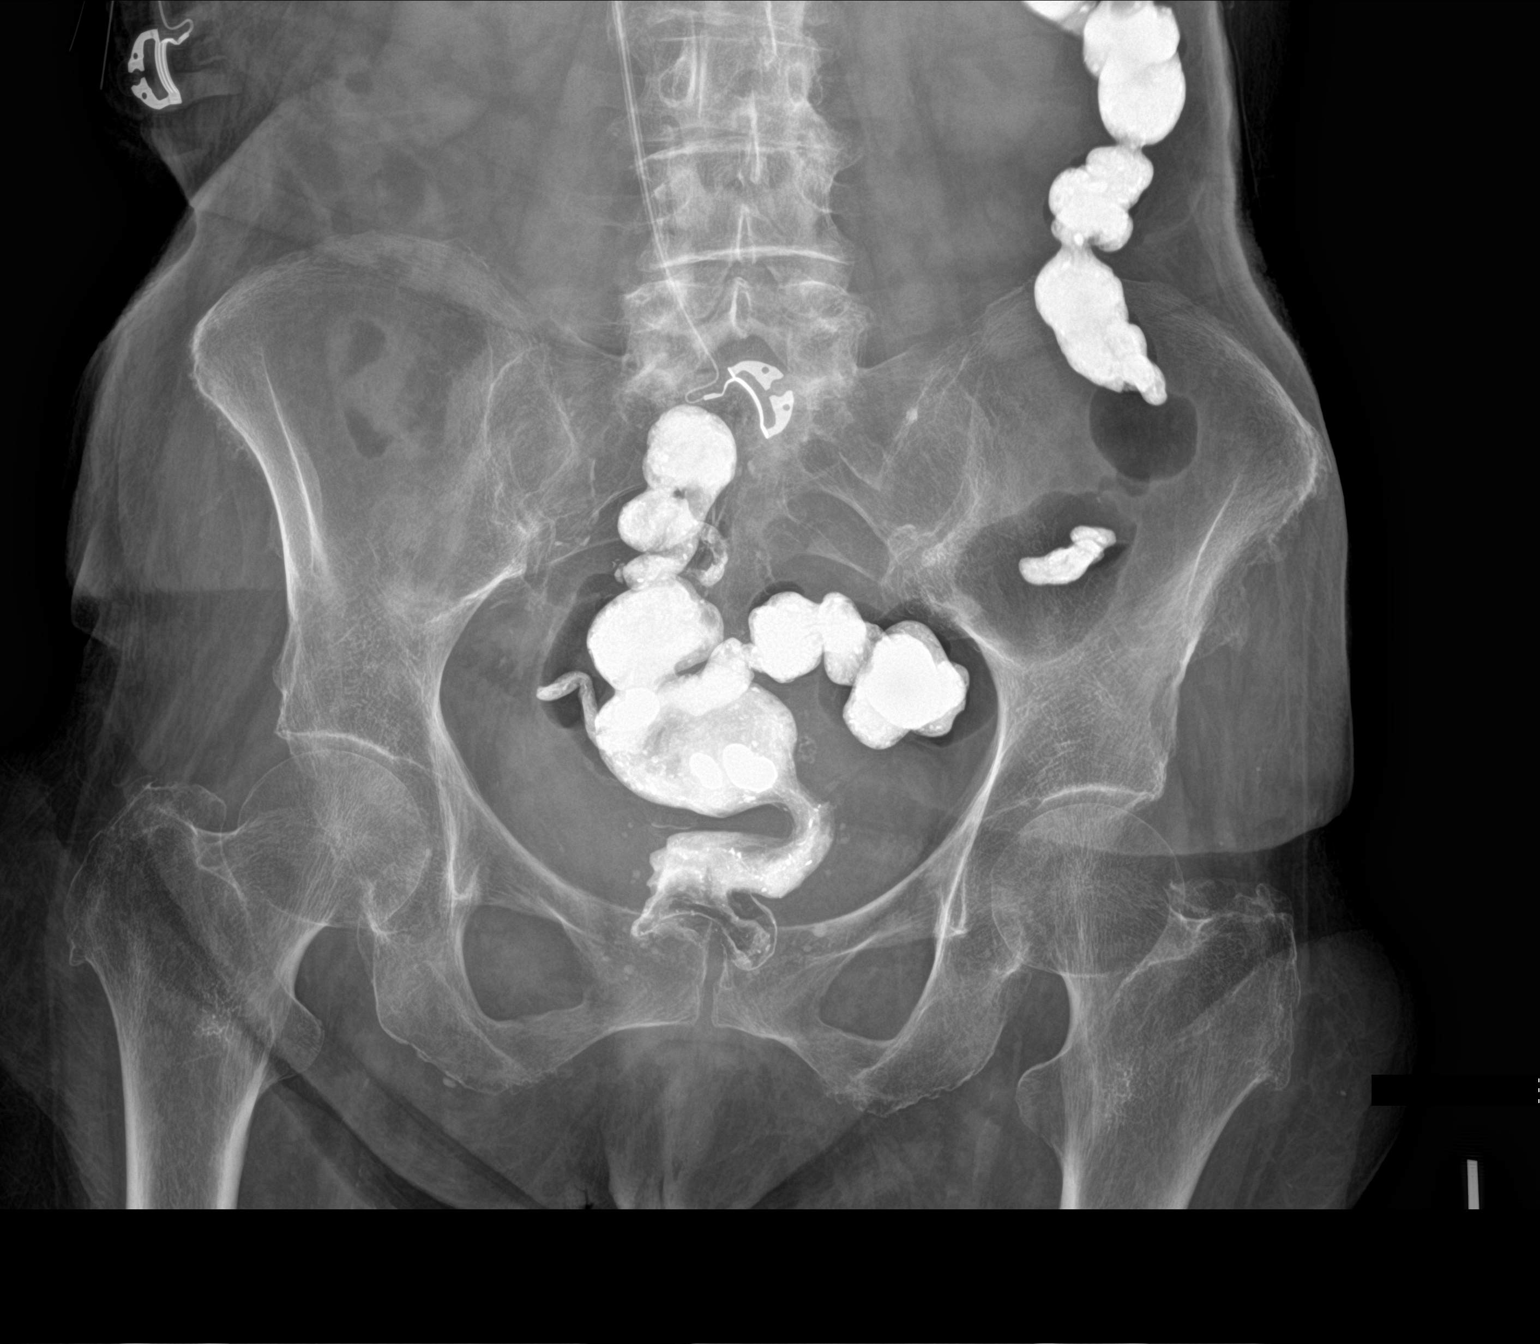

[1 of 1 positions shown; findings below may reference images not displayed]

FINDINGS: Osseous demineralization.

Hip and SI joint spaces preserved.

Artifacts from retained barium project over the pelvis.

No acute fracture, dislocation, or bone destruction.

Mild degenerative disc and facet disease changes at visualized
lumbar spine.
IMPRESSION: Osseous demineralization.

No acute abnormalities.
